# Patient Record
Sex: Male | Born: 1970
Health system: Southern US, Community
[De-identification: ages and names within clinical notes are randomized; demographics above are authoritative.]

## PROBLEM LIST (undated history)

## (undated) ENCOUNTER — Emergency Department (HOSPITAL_COMMUNITY): Payer: Medicaid Other

## (undated) DIAGNOSIS — I4891 Unspecified atrial fibrillation: Secondary | ICD-10-CM

## (undated) DIAGNOSIS — N289 Disorder of kidney and ureter, unspecified: Secondary | ICD-10-CM

## (undated) DIAGNOSIS — E119 Type 2 diabetes mellitus without complications: Secondary | ICD-10-CM

## (undated) HISTORY — PX: BELOW KNEE LEG AMPUTATION: SUR23

---

## 2020-05-12 ENCOUNTER — Encounter (HOSPITAL_COMMUNITY): Payer: Self-pay | Admitting: Emergency Medicine

## 2020-05-12 ENCOUNTER — Emergency Department (HOSPITAL_COMMUNITY)
Admission: EM | Admit: 2020-05-12 | Discharge: 2020-05-13 | Disposition: A | Discharge status: Home / Self Care | Payer: Medicaid Other | Attending: Emergency Medicine | Admitting: Emergency Medicine

## 2020-05-12 ENCOUNTER — Other Ambulatory Visit: Payer: Self-pay

## 2020-05-12 DIAGNOSIS — Z992 Dependence on renal dialysis: Secondary | ICD-10-CM | POA: Diagnosis not present

## 2020-05-12 DIAGNOSIS — L0231 Cutaneous abscess of buttock: Secondary | ICD-10-CM | POA: Diagnosis not present

## 2020-05-12 DIAGNOSIS — I10 Essential (primary) hypertension: Secondary | ICD-10-CM

## 2020-05-12 DIAGNOSIS — N186 End stage renal disease: Secondary | ICD-10-CM | POA: Insufficient documentation

## 2020-05-12 DIAGNOSIS — E1122 Type 2 diabetes mellitus with diabetic chronic kidney disease: Secondary | ICD-10-CM | POA: Insufficient documentation

## 2020-05-12 DIAGNOSIS — I120 Hypertensive chronic kidney disease with stage 5 chronic kidney disease or end stage renal disease: Secondary | ICD-10-CM | POA: Diagnosis not present

## 2020-05-12 DIAGNOSIS — R222 Localized swelling, mass and lump, trunk: Secondary | ICD-10-CM | POA: Diagnosis present

## 2020-05-12 HISTORY — DX: Disorder of kidney and ureter, unspecified: N28.9

## 2020-05-12 HISTORY — DX: Type 2 diabetes mellitus without complications: E11.9

## 2020-05-12 HISTORY — DX: Unspecified atrial fibrillation: I48.91

## 2020-05-12 LAB — CBC WITH DIFFERENTIAL/PLATELET
Abs Immature Granulocytes: 0.09 10*3/uL — ABNORMAL HIGH (ref 0.00–0.07)
Basophils Absolute: 0.1 10*3/uL (ref 0.0–0.1)
Basophils Relative: 0 %
Eosinophils Absolute: 0.1 10*3/uL (ref 0.0–0.5)
Eosinophils Relative: 1 %
HCT: 30.1 % — ABNORMAL LOW (ref 39.0–52.0)
Hemoglobin: 9.5 g/dL — ABNORMAL LOW (ref 13.0–17.0)
Immature Granulocytes: 1 %
Lymphocytes Relative: 3 %
Lymphs Abs: 0.5 10*3/uL — ABNORMAL LOW (ref 0.7–4.0)
MCH: 29.2 pg (ref 26.0–34.0)
MCHC: 31.6 g/dL (ref 30.0–36.0)
MCV: 92.6 fL (ref 80.0–100.0)
Monocytes Absolute: 1 10*3/uL (ref 0.1–1.0)
Monocytes Relative: 7 %
Neutro Abs: 12.5 10*3/uL — ABNORMAL HIGH (ref 1.7–7.7)
Neutrophils Relative %: 88 %
Platelets: 203 10*3/uL (ref 150–400)
RBC: 3.25 MIL/uL — ABNORMAL LOW (ref 4.22–5.81)
RDW: 16.9 % — ABNORMAL HIGH (ref 11.5–15.5)
WBC: 14.2 10*3/uL — ABNORMAL HIGH (ref 4.0–10.5)
nRBC: 0 % (ref 0.0–0.2)

## 2020-05-12 LAB — CBG MONITORING, ED
Glucose-Capillary: 513 mg/dL (ref 70–99)
Glucose-Capillary: 460 mg/dL — ABNORMAL HIGH (ref 70–99)
Glucose-Capillary: 277 mg/dL — ABNORMAL HIGH (ref 70–99)

## 2020-05-12 LAB — COMPREHENSIVE METABOLIC PANEL
ALT: 19 U/L (ref 0–44)
AST: 21 U/L (ref 15–41)
Albumin: 2.5 g/dL — ABNORMAL LOW (ref 3.5–5.0)
Alkaline Phosphatase: 178 U/L — ABNORMAL HIGH (ref 38–126)
Anion gap: 14 (ref 5–15)
BUN: 71 mg/dL — ABNORMAL HIGH (ref 6–20)
CO2: 22 mmol/L (ref 22–32)
Calcium: 7.9 mg/dL — ABNORMAL LOW (ref 8.9–10.3)
Chloride: 90 mmol/L — ABNORMAL LOW (ref 98–111)
Creatinine, Ser: 12.51 mg/dL — ABNORMAL HIGH (ref 0.61–1.24)
GFR calc Af Amer: 5 mL/min — ABNORMAL LOW (ref >60)
GFR calc non Af Amer: 4 mL/min — ABNORMAL LOW (ref >60)
Glucose, Bld: 576 mg/dL (ref 70–99)
Potassium: 5.2 mmol/L — ABNORMAL HIGH (ref 3.5–5.1)
Sodium: 126 mmol/L — ABNORMAL LOW (ref 135–145)
Total Bilirubin: 0.8 mg/dL (ref 0.3–1.2)
Total Protein: 6.7 g/dL (ref 6.5–8.1)

## 2020-05-12 LAB — RENAL FUNCTION PANEL
Albumin: 2.6 g/dL — ABNORMAL LOW (ref 3.5–5.0)
Anion gap: 19 — ABNORMAL HIGH (ref 5–15)
BUN: 78 mg/dL — ABNORMAL HIGH (ref 6–20)
CO2: 23 mmol/L (ref 22–32)
Calcium: 8.6 mg/dL — ABNORMAL LOW (ref 8.9–10.3)
Chloride: 92 mmol/L — ABNORMAL LOW (ref 98–111)
Creatinine, Ser: 13.24 mg/dL — ABNORMAL HIGH (ref 0.61–1.24)
GFR calc Af Amer: 5 mL/min — ABNORMAL LOW (ref >60)
GFR calc non Af Amer: 4 mL/min — ABNORMAL LOW (ref >60)
Glucose, Bld: 174 mg/dL — ABNORMAL HIGH (ref 70–99)
Phosphorus: 7.1 mg/dL — ABNORMAL HIGH (ref 2.5–4.6)
Potassium: 4.9 mmol/L (ref 3.5–5.1)
Sodium: 134 mmol/L — ABNORMAL LOW (ref 135–145)

## 2020-05-12 MED ORDER — SODIUM CHLORIDE 0.9 % IV SOLN: 100.0000 mL | INTRAVENOUS | Status: DC | PRN

## 2020-05-12 MED ORDER — PANTOPRAZOLE SODIUM 40 MG PO TBEC
40.00 | DELAYED_RELEASE_TABLET | ORAL | Status: DC
Start: 2020-05-11 — End: 2020-05-12

## 2020-05-12 MED ORDER — ATORVASTATIN CALCIUM 40 MG PO TABS
40.00 | ORAL_TABLET | ORAL | Status: DC
Start: 2020-05-10 — End: 2020-05-12

## 2020-05-12 MED ORDER — HYDRALAZINE HCL 25 MG PO TABS
100.0000 mg | ORAL_TABLET | Freq: Three times a day (TID) | ORAL | Status: DC
  Administered 2020-05-12 (×2): 100 mg via ORAL
  Filled 2020-05-12 (×2): qty 4

## 2020-05-12 MED ORDER — SODIUM ZIRCONIUM CYCLOSILICATE 10 G PO PACK: 20.0000 g | PACK | Freq: Once | ORAL | Status: DC

## 2020-05-12 MED ORDER — HEPARIN SODIUM (PORCINE) 1000 UNIT/ML IJ SOLN
INTRAMUSCULAR | Status: AC
  Administered 2020-05-12: 1000 [IU] via INTRAVENOUS_CENTRAL
  Filled 2020-05-12: qty 4

## 2020-05-12 MED ORDER — HYDRALAZINE HCL 50 MG PO TABS
100.00 | ORAL_TABLET | ORAL | Status: DC
Start: 2020-05-10 — End: 2020-05-12

## 2020-05-12 MED ORDER — INSULIN LISPRO 100 UNIT/ML ~~LOC~~ SOLN: 2.0000 [IU] | Freq: Three times a day (TID) | SUBCUTANEOUS | Status: DC

## 2020-05-12 MED ORDER — AMLODIPINE BESYLATE 5 MG PO TABS
10.00 | ORAL_TABLET | ORAL | Status: DC
Start: 2020-05-11 — End: 2020-05-12

## 2020-05-12 MED ORDER — INSULIN GLARGINE 100 UNIT/ML ~~LOC~~ SOLN
25.0000 [IU] | Freq: Every day | SUBCUTANEOUS | Status: DC
  Administered 2020-05-12: 25 [IU] via SUBCUTANEOUS
  Filled 2020-05-12 (×2): qty 0.25

## 2020-05-12 MED ORDER — ACETAMINOPHEN 325 MG PO TABS
650.00 | ORAL_TABLET | ORAL | Status: DC
Start: ? — End: 2020-05-12

## 2020-05-12 MED ORDER — PREGABALIN 25 MG PO CAPS
25.00 | ORAL_CAPSULE | ORAL | Status: DC
Start: 2020-05-11 — End: 2020-05-12

## 2020-05-12 MED ORDER — APIXABAN 2.5 MG PO TABS
2.5000 mg | ORAL_TABLET | Freq: Two times a day (BID) | ORAL | Status: DC
  Filled 2020-05-12: qty 1

## 2020-05-12 MED ORDER — HEPARIN SODIUM (PORCINE) 1000 UNIT/ML DIALYSIS
1000.0000 [IU] | INTRAMUSCULAR | Status: DC | PRN
  Filled 2020-05-12 (×2): qty 1

## 2020-05-12 MED ORDER — CLONIDINE HCL 0.1 MG PO TABS
0.10 | ORAL_TABLET | ORAL | Status: DC
Start: ? — End: 2020-05-12

## 2020-05-12 MED ORDER — CARVEDILOL 12.5 MG PO TABS
12.5000 mg | ORAL_TABLET | Freq: Two times a day (BID) | ORAL | Status: DC
  Administered 2020-05-12: 12.5 mg via ORAL
  Filled 2020-05-12: qty 1

## 2020-05-12 MED ORDER — QUETIAPINE FUMARATE 200 MG PO TABS
200.0000 mg | ORAL_TABLET | Freq: Every day | ORAL | Status: DC
  Filled 2020-05-12: qty 1

## 2020-05-12 MED ORDER — ATORVASTATIN CALCIUM 40 MG PO TABS
40.0000 mg | ORAL_TABLET | Freq: Every day | ORAL | Status: DC
  Administered 2020-05-12: 40 mg via ORAL
  Filled 2020-05-12: qty 1

## 2020-05-12 MED ORDER — AMLODIPINE BESYLATE 5 MG PO TABS
10.0000 mg | ORAL_TABLET | Freq: Every day | ORAL | Status: DC
  Administered 2020-05-12: 10 mg via ORAL
  Filled 2020-05-12: qty 2

## 2020-05-12 MED ORDER — BUMETANIDE 2 MG PO TABS
2.0000 mg | ORAL_TABLET | ORAL | Status: DC
  Administered 2020-05-12: 2 mg via ORAL
  Filled 2020-05-12 (×2): qty 1

## 2020-05-12 MED ORDER — PREGABALIN 25 MG PO CAPS: 25.0000 mg | ORAL_CAPSULE | Freq: Every day | ORAL | Status: DC

## 2020-05-12 MED ORDER — DOXYCYCLINE HYCLATE 100 MG PO CAPS
100.0000 mg | ORAL_CAPSULE | Freq: Two times a day (BID) | ORAL | 0 refills | Status: DC
Start: 2020-05-12 — End: 2020-07-07

## 2020-05-12 MED ORDER — GLUCOSE 40 % PO GEL
15.00 | ORAL | Status: DC
Start: ? — End: 2020-05-12

## 2020-05-12 MED ORDER — INSULIN ASPART 100 UNIT/ML ~~LOC~~ SOLN
2.0000 [IU] | Freq: Three times a day (TID) | SUBCUTANEOUS | Status: DC
  Administered 2020-05-12: 7 [IU] via SUBCUTANEOUS

## 2020-05-12 MED ORDER — OXYCODONE-ACETAMINOPHEN 5-325 MG PO TABS
2.0000 | ORAL_TABLET | Freq: Once | ORAL | Status: AC
  Administered 2020-05-12: 2 via ORAL
  Filled 2020-05-12: qty 2

## 2020-05-12 MED ORDER — CARVEDILOL 12.5 MG PO TABS
25.00 | ORAL_TABLET | ORAL | Status: DC
Start: 2020-05-10 — End: 2020-05-12

## 2020-05-12 MED ORDER — AMIODARONE HCL 200 MG PO TABS
200.00 | ORAL_TABLET | ORAL | Status: DC
Start: 2020-05-11 — End: 2020-05-12

## 2020-05-12 MED ORDER — ARIPIPRAZOLE 5 MG PO TABS
5.00 | ORAL_TABLET | ORAL | Status: DC
Start: 2020-05-11 — End: 2020-05-12

## 2020-05-12 MED ORDER — LIDOCAINE-PRILOCAINE 2.5-2.5 % EX CREA
1.0000 "application " | TOPICAL_CREAM | CUTANEOUS | Status: DC | PRN
  Filled 2020-05-12: qty 5

## 2020-05-12 MED ORDER — AMIODARONE HCL 200 MG PO TABS
200.0000 mg | ORAL_TABLET | Freq: Every day | ORAL | Status: DC
  Administered 2020-05-12: 200 mg via ORAL
  Filled 2020-05-12: qty 1

## 2020-05-12 MED ORDER — ALTEPLASE 2 MG IJ SOLR: 2.0000 mg | Freq: Once | INTRAMUSCULAR | Status: DC | PRN

## 2020-05-12 MED ORDER — SEVELAMER CARBONATE 800 MG PO TABS
1600.0000 mg | ORAL_TABLET | Freq: Three times a day (TID) | ORAL | Status: DC
  Administered 2020-05-12: 1600 mg via ORAL
  Filled 2020-05-12 (×4): qty 2

## 2020-05-12 MED ORDER — DEXTROSE 10 % IV SOLN
125.00 | INTRAVENOUS | Status: DC
Start: ? — End: 2020-05-12

## 2020-05-12 MED ORDER — APIXABAN 5 MG PO TABS
5.0000 mg | ORAL_TABLET | Freq: Two times a day (BID) | ORAL | Status: DC
  Administered 2020-05-12: 5 mg via ORAL
  Filled 2020-05-12 (×2): qty 1

## 2020-05-12 MED ORDER — LIDOCAINE HCL (PF) 1 % IJ SOLN: 5.0000 mL | INTRAMUSCULAR | Status: DC | PRN

## 2020-05-12 MED ORDER — CHLORHEXIDINE GLUCONATE CLOTH 2 % EX PADS: 6.0000 | MEDICATED_PAD | Freq: Every day | CUTANEOUS | Status: DC

## 2020-05-12 MED ORDER — PENTAFLUOROPROP-TETRAFLUOROETH EX AERO
1.0000 "application " | INHALATION_SPRAY | CUTANEOUS | Status: DC | PRN
  Filled 2020-05-12: qty 116

## 2020-05-12 MED ORDER — LOSARTAN POTASSIUM 50 MG PO TABS
100.00 | ORAL_TABLET | ORAL | Status: DC
Start: 2020-05-11 — End: 2020-05-12

## 2020-05-12 MED ORDER — CEPHALEXIN 250 MG PO CAPS
1000.0000 mg | ORAL_CAPSULE | Freq: Once | ORAL | Status: AC
  Administered 2020-05-12: 1000 mg via ORAL
  Filled 2020-05-12: qty 4

## 2020-05-12 MED ORDER — DOXYCYCLINE HYCLATE 100 MG PO TABS
100.0000 mg | ORAL_TABLET | Freq: Once | ORAL | Status: AC
  Administered 2020-05-12: 100 mg via ORAL
  Filled 2020-05-12: qty 1

## 2020-05-12 MED ORDER — LOPERAMIDE HCL 2 MG PO CAPS
2.00 | ORAL_CAPSULE | ORAL | Status: DC
Start: ? — End: 2020-05-12

## 2020-05-12 MED ORDER — ZOLPIDEM TARTRATE 5 MG PO TABS
10.00 | ORAL_TABLET | ORAL | Status: DC
Start: ? — End: 2020-05-12

## 2020-05-12 MED ORDER — LOSARTAN POTASSIUM 50 MG PO TABS
100.0000 mg | ORAL_TABLET | Freq: Every day | ORAL | Status: DC
  Administered 2020-05-12: 100 mg via ORAL
  Filled 2020-05-12: qty 2

## 2020-05-12 MED ORDER — SEVELAMER CARBONATE 800 MG PO TABS
1600.00 | ORAL_TABLET | ORAL | Status: DC
Start: 2020-05-10 — End: 2020-05-12

## 2020-05-12 MED ORDER — INSULIN LISPRO 100 UNIT/ML ~~LOC~~ SOLN
2.00 | SUBCUTANEOUS | Status: DC
Start: 2020-05-10 — End: 2020-05-12

## 2020-05-12 MED ORDER — HYDROXYZINE HCL 10 MG/5ML PO SYRP
10.00 | ORAL_SOLUTION | ORAL | Status: DC
Start: ? — End: 2020-05-12

## 2020-05-12 MED ORDER — PANTOPRAZOLE SODIUM 40 MG PO TBEC
40.0000 mg | DELAYED_RELEASE_TABLET | Freq: Every day | ORAL | Status: DC
  Administered 2020-05-12: 40 mg via ORAL
  Filled 2020-05-12: qty 1

## 2020-05-12 MED ORDER — TAMSULOSIN HCL 0.4 MG PO CAPS
0.40 | ORAL_CAPSULE | ORAL | Status: DC
Start: 2020-05-11 — End: 2020-05-12

## 2020-05-12 MED ORDER — INSULIN GLARGINE 100 UNIT/ML ~~LOC~~ SOLN
20.00 | SUBCUTANEOUS | Status: DC
Start: 2020-05-10 — End: 2020-05-12

## 2020-05-12 MED ORDER — LIDOCAINE-EPINEPHRINE (PF) 2 %-1:200000 IJ SOLN
20.0000 mL | Freq: Once | INTRAMUSCULAR | Status: AC
  Administered 2020-05-12: 20 mL
  Filled 2020-05-12: qty 20

## 2020-05-12 MED ORDER — TAMSULOSIN HCL 0.4 MG PO CAPS
0.4000 mg | ORAL_CAPSULE | Freq: Every day | ORAL | Status: DC
  Administered 2020-05-12: 0.4 mg via ORAL
  Filled 2020-05-12: qty 1

## 2020-05-12 MED ORDER — BUPROPION HCL ER (XL) 300 MG PO TB24
300.00 | ORAL_TABLET | ORAL | Status: DC
Start: 2020-05-11 — End: 2020-05-12

## 2020-05-12 MED ORDER — DICYCLOMINE HCL 10 MG PO CAPS
10.00 | ORAL_CAPSULE | ORAL | Status: DC
Start: 2020-05-10 — End: 2020-05-12

## 2020-05-12 MED ORDER — BUPROPION HCL ER (SR) 150 MG PO TB12
150.0000 mg | ORAL_TABLET | Freq: Every day | ORAL | Status: DC
  Administered 2020-05-12: 150 mg via ORAL
  Filled 2020-05-12: qty 1

## 2020-05-12 MED ORDER — INSULIN ASPART 100 UNIT/ML ~~LOC~~ SOLN
16.0000 [IU] | Freq: Once | SUBCUTANEOUS | Status: AC
  Administered 2020-05-12: 16 [IU] via SUBCUTANEOUS

## 2020-05-12 MED ORDER — ARIPIPRAZOLE 5 MG PO TABS
5.0000 mg | ORAL_TABLET | Freq: Every day | ORAL | Status: DC
  Administered 2020-05-12: 5 mg via ORAL
  Filled 2020-05-12: qty 1

## 2020-05-12 MED ORDER — GLUCAGON (RDNA) 1 MG IJ KIT
1.00 | PACK | INTRAMUSCULAR | Status: DC
Start: ? — End: 2020-05-12

## 2020-05-12 NOTE — ED Notes (Signed)
Pt requesting to have CBG checked, cbg 513.

## 2020-05-12 NOTE — Discharge Instructions (Addendum)
It was our pleasure to provide your ER care today - we hope that you feel better.  Make sure to go to your regular dialysis appointments.  Keep wound on buttock area very clean. Follow up with primary care doctor, or urgent care, in 2 days time for wound check and packing removal. Take antibiotic as prescribed.   Your blood pressure is high - take your medication as prescribed, limit salt intake, and follow up with primary care doctor this week.   Return to ER right away if worse, new symptoms, fevers, increased swelling or spreading redness, severe pain, trouble breathing, or other concern.

## 2020-05-12 NOTE — ED Notes (Signed)
Dinner Tray Ordered @ 1655. 

## 2020-05-12 NOTE — Consult Note (Signed)
Reason for Consult: To manage dialysis and dialysis related needs Referring Physician: Treveon Bourcier is an 49 y.o. male.  HPI: 49 year old male with ESRD, homelessness, hypertension, peripheral arterial disease status post bilateral BKA and chronic wounds who presents after missing dialysis yesterday.  He typically gets dialysis in [place].  He states that he is moving to [place].  It seems that he gets most of his care in [place].  He has not had any plans to change his dialysis to this location.  He is having pain in his buttocks related to wounds.  He was previously receiving wound care in [place].  He has had difficulty taking his medications.  He is planning to go back to his old dialysis unit on Tuesday.   Dialyzes at [facility]? In [place]    Past Medical History:  Diagnosis Date  . Atrial fibrillation ([place])   . Diabetes mellitus without complication ([place])   . Renal disorder    end stage    Past Surgical History:  Procedure Laterality Date  . BELOW KNEE LEG AMPUTATION Bilateral     FH: alcohol use disorder in father  Social History:  has no history on file for tobacco, alcohol, and drug.  Allergies: No Known Allergies  Medications: I have reviewed the patient's current medications.   Results for orders placed or performed during the hospital encounter of 05/12/20 (from the past 48 hour(s))  Comprehensive metabolic panel     Status: Abnormal   Collection Time: 05/12/20  6:14 AM  Result Value Ref Range   Sodium 126 (L) 135 - 145 mmol/L   Potassium 5.2 (H) 3.5 - 5.1 mmol/L   Chloride 90 (L) 98 - 111 mmol/L   CO2 22 22 - 32 mmol/L   Glucose, Bld 576 (HH) 70 - 99 mg/dL    Comment: Glucose reference range applies only to samples taken after fasting for at least 8 hours. CRITICAL RESULT CALLED TO, READ BACK BY AND VERIFIED WITH: K.NEWNEM RN @ [phone] 05/12/2020 BY C.EDENS    BUN 71 (H) 6 - 20 mg/dL   Creatinine, Ser 12.51 (H) 0.61 - 1.24  mg/dL   Calcium 7.9 (L) 8.9 - 10.3 mg/dL   Total Protein 6.7 6.5 - 8.1 g/dL   Albumin 2.5 (L) 3.5 - 5.0 g/dL   AST 21 15 - 41 U/L   ALT 19 0 - 44 U/L   Alkaline Phosphatase 178 (H) 38 - 126 U/L   Total Bilirubin 0.8 0.3 - 1.2 mg/dL   GFR calc non Af Amer 4 (L) >60 mL/min   GFR calc Af Amer 5 (L) >60 mL/min   Anion gap 14 5 - 15    Comment: Performed at [facility] [address] [place], [place] [address]  CBC with Differential     Status: Abnormal   Collection Time: 05/12/20  6:14 AM  Result Value Ref Range   WBC 14.2 (H) 4.0 - 10.5 K/uL   RBC 3.25 (L) 4.22 - 5.81 MIL/uL   Hemoglobin 9.5 (L) 13.0 - 17.0 g/dL   HCT 30.1 (L) 39.0 - 52.0 %   MCV 92.6 80.0 - 100.0 fL   MCH 29.2 26.0 - 34.0 pg   MCHC 31.6 30.0 - 36.0 g/dL   RDW 16.9 (H) 11.5 - 15.5 %   Platelets 203 150 - 400 K/uL   nRBC 0.0 0.0 - 0.2 %   Neutrophils Relative % 88 %   Neutro Abs 12.5 (H) 1.7 -  7.7 K/uL   Lymphocytes Relative 3 %   Lymphs Abs 0.5 (L) 0.7 - 4.0 K/uL   Monocytes Relative 7 %   Monocytes Absolute 1.0 0.1 - 1.0 K/uL   Eosinophils Relative 1 %   Eosinophils Absolute 0.1 0.0 - 0.5 K/uL   Basophils Relative 0 %   Basophils Absolute 0.1 0.0 - 0.1 K/uL   Immature Granulocytes 1 %   Abs Immature Granulocytes 0.09 (H) 0.00 - 0.07 K/uL    Comment: Performed at [facility] [address] [place], [place] [address]  CBG monitoring, ED     Status: Abnormal   Collection Time: 05/12/20  8:54 AM  Result Value Ref Range   Glucose-Capillary 513 (HH) 70 - 99 mg/dL    Comment: Glucose reference range applies only to samples taken after fasting for at least 8 hours.   Comment 1 Notify RN   CBG monitoring, ED     Status: Abnormal   Collection Time: 05/12/20 11:23 AM  Result Value Ref Range   Glucose-Capillary 460 (H) 70 - 99 mg/dL    Comment: Glucose reference range applies only to samples taken after fasting for at least 8 hours.   Comment 1 Notify RN    Comment 2 Document in Chart     No  results found.  ROS: 12 system reviewed and negative except per HPI Blood pressure (!) 209/123, pulse (!) 101, temperature 97.9 F (36.6 C), temperature source Oral, resp. rate 18, weight 117.9 kg, SpO2 100 %. GEN: Chronically ill-appearing, lying in bed, no distress ENT: no nasal discharge, mmm EYES: no scleral icterus, eomi CV: normal rate, no murmurs PULM: no iwob, bilateral chest rise ABD: NABS, non-distended SKIN: no rashes or jaundice EXT: Minimal stop edema, warm   Assessment/Plan: 1 buttock abscess: Status post drainage in the ER.  Needs to follow-up with wound care in [place].  Defer further management to ER 2 ESRD: We will perform dialysis today.  Instructed the patient that he has to go to dialysis at his home unit and if he wishes to change to a different unit they will need to get him set up.  We will use his labs to guide treatment as we do not have access to his records. 3 Hypertension: Blood pressure markedly elevated intermittently in the emergency department.  Ultrafiltration with dialysis and recommend he continue his home medications 4. Anemia of ESRD: Hemoglobin 9.5.  No ESA's given we do not know his scalp patient regimen. 5. Metabolic Bone Disease: On sevelamer 1600 mg 3 times daily at home   Reesa Chew 05/12/2020, 4:20 PM

## 2020-05-12 NOTE — ED Notes (Signed)
Patient now reports that he wears 5L O2 at baseline. Pts O2 sat 99% on room air. patient provided supplemental O2 per his request.

## 2020-05-12 NOTE — ED Triage Notes (Signed)
Pt arrives via [facility] from home for c/o "blisters" to buttocks x1 week. Reports he is unable to lie down or sit on his buttocks due to pain. Hx of dialysis, missed session yesterday. EMS VS 176/112, HR 109, O2 96% ra, temp 97.7. pt also has bilateral bkas.

## 2020-05-12 NOTE — ED Notes (Signed)
Pt transported to Hemodialysis.  

## 2020-05-12 NOTE — ED Provider Notes (Signed)
[facility] EMERGENCY DEPARTMENT Provider Note   CSN: [id number] Arrival date & time: 05/12/20  0556     History Chief Complaint  Patient presents with  . Pressure Ulcer    Lucas [name] is a 49 y.o. male.  Patient with hx ESRD on HD T/TH/Sat, c/o missed dialysis yesterday and left buttock abscess. Symptoms acute onset in past few days, constant, moderate, persistent, dull, non radiating. Denies injury or bite/sting to area of abscess. States hx same on other side. No recent antibiotic use. No recent I and D. States missed his dialysis yesterday - states used to live in [place], but due to non compliance issues could not find nephrologist in [place], so has been getting his dialysis in '[place]. Reports feeling generally weak. No increased sob (uses home o2 at baseline). No cp. No abd pain or nvd.   The history is provided by the patient.       Past Medical History:  Diagnosis Date  . Atrial fibrillation ([place])   . Diabetes mellitus without complication ([place])   . Renal disorder    end stage    There are no problems to display for this patient.   Past Surgical History:  Procedure Laterality Date  . BELOW KNEE LEG AMPUTATION Bilateral        No family history on file.  Social History   Tobacco Use  . Smoking status: Not on file  Substance Use Topics  . Alcohol use: Not on file  . Drug use: Not on file    Home Medications Prior to Admission medications   Not on File    Allergies    Patient has no known allergies.  Review of Systems   Review of Systems  Constitutional: Negative for fever.  HENT: Negative for sore throat.   Eyes: Negative for redness.  Respiratory: Negative for shortness of breath.   Cardiovascular: Negative for chest pain.  Gastrointestinal: Negative for abdominal pain.  Genitourinary: Negative for flank pain.  Musculoskeletal: Negative for back pain and neck pain.  Skin: Negative for rash.    Neurological: Negative for headaches.  Hematological: Does not bruise/bleed easily.  Psychiatric/Behavioral: Negative for confusion.    Physical Exam Updated Vital Signs BP (!) 155/92   Pulse 100   Temp 97.9 F (36.6 C) (Oral)   Resp 18   Wt 117.9 kg   SpO2 99%   Physical Exam Vitals and nursing note reviewed.  Constitutional:      Appearance: Normal appearance. He is well-developed.  HENT:     Head: Atraumatic.     Nose: Nose normal.     Mouth/Throat:     Mouth: Mucous membranes are moist.     Pharynx: Oropharynx is clear.  Eyes:     General: No scleral icterus.    Conjunctiva/sclera: Conjunctivae normal.  Neck:     Trachea: No tracheal deviation.  Cardiovascular:     Rate and Rhythm: Normal rate and regular rhythm.     Pulses: Normal pulses.     Heart sounds: Normal heart sounds. No murmur. No friction rub. No gallop.   Pulmonary:     Effort: Pulmonary effort is normal. No accessory muscle usage or respiratory distress.     Breath sounds: Normal breath sounds.  Abdominal:     General: Bowel sounds are normal. There is no distension.     Palpations: Abdomen is soft.     Tenderness: There is no abdominal tenderness. There is no guarding.  Genitourinary:  Comments: No cva tenderness. Musculoskeletal:        General: No swelling.     Cervical back: Normal range of motion and neck supple. No rigidity.     Comments: Bilateral lower extremity amputations.   Skin:    General: Skin is warm and dry.     Findings: No rash.     Comments: Left buttock abscess, 3 by 6 cm with mild surrounding induration, ?sl erythema.  At center of abscess is  ~ 1 cm diameter superficial skin ulcer/eschar.  No crepitus.   Neurological:     Mental Status: He is alert.     Comments: Alert, speech clear.   Psychiatric:        Mood and Affect: Mood normal.     ED Results / Procedures / Treatments   Labs (all labs ordered are listed, but only abnormal results are displayed) Results  for orders placed or performed during the hospital encounter of 05/12/20  Comprehensive metabolic panel  Result Value Ref Range   Sodium 126 (L) 135 - 145 mmol/L   Potassium 5.2 (H) 3.5 - 5.1 mmol/L   Chloride 90 (L) 98 - 111 mmol/L   CO2 22 22 - 32 mmol/L   Glucose, Bld 576 (HH) 70 - 99 mg/dL   BUN 71 (H) 6 - 20 mg/dL   Creatinine, Ser 12.51 (H) 0.61 - 1.24 mg/dL   Calcium 7.9 (L) 8.9 - 10.3 mg/dL   Total Protein 6.7 6.5 - 8.1 g/dL   Albumin 2.5 (L) 3.5 - 5.0 g/dL   AST 21 15 - 41 U/L   ALT 19 0 - 44 U/L   Alkaline Phosphatase 178 (H) 38 - 126 U/L   Total Bilirubin 0.8 0.3 - 1.2 mg/dL   GFR calc non Af Amer 4 (L) >60 mL/min   GFR calc Af Amer 5 (L) >60 mL/min   Anion gap 14 5 - 15  CBC with Differential  Result Value Ref Range   WBC 14.2 (H) 4.0 - 10.5 K/uL   RBC 3.25 (L) 4.22 - 5.81 MIL/uL   Hemoglobin 9.5 (L) 13.0 - 17.0 g/dL   HCT 30.1 (L) 39.0 - 52.0 %   MCV 92.6 80.0 - 100.0 fL   MCH 29.2 26.0 - 34.0 pg   MCHC 31.6 30.0 - 36.0 g/dL   RDW 16.9 (H) 11.5 - 15.5 %   Platelets 203 150 - 400 K/uL   nRBC 0.0 0.0 - 0.2 %   Neutrophils Relative % 88 %   Neutro Abs 12.5 (H) 1.7 - 7.7 K/uL   Lymphocytes Relative 3 %   Lymphs Abs 0.5 (L) 0.7 - 4.0 K/uL   Monocytes Relative 7 %   Monocytes Absolute 1.0 0.1 - 1.0 K/uL   Eosinophils Relative 1 %   Eosinophils Absolute 0.1 0.0 - 0.5 K/uL   Basophils Relative 0 %   Basophils Absolute 0.1 0.0 - 0.1 K/uL   Immature Granulocytes 1 %   Abs Immature Granulocytes 0.09 (H) 0.00 - 0.07 K/uL  CBG monitoring, ED  Result Value Ref Range   Glucose-Capillary 513 (HH) 70 - 99 mg/dL   Comment 1 Notify RN     EKG None  Radiology No results found.  Procedures .Marland KitchenIncision and Drainage  Date/Time: 05/12/2020 10:58 AM Performed by: Lajean Saver, MD Authorized by: Lajean Saver, MD   Consent:    Consent given by:  Patient Location:    Type:  Abscess   Location:  Lower extremity   Lower  extremity location:  Buttock   Buttock  location:  L buttock Pre-procedure details:    Skin preparation:  Betadine Procedure type:    Complexity:  Complex Procedure details:    Incision types:  Elliptical   Incision depth:  Subcutaneous   Scalpel blade:  11   Wound management:  Probed and deloculated, irrigated with saline and debrided (small, superficial eschar debrided)   Drainage:  Purulent   Drainage amount:  Moderate   Wound treatment:  Drain placed   Packing materials:  1/4 in gauze Post-procedure details:    Patient tolerance of procedure:  Tolerated well, no immediate complications   (including critical care time)  Medications Ordered in ED Medications  oxyCODONE-acetaminophen (PERCOCET/ROXICET) 5-325 MG per tablet 2 tablet (has no administration in time range)  lidocaine-EPINEPHrine (XYLOCAINE W/EPI) 2 %-1:200000 (PF) injection 20 mL (has no administration in time range)    ED Course  I have reviewed the triage vital signs and the nursing notes.  Pertinent labs & imaging results that were available during my care of the patient were reviewed by me and considered in my medical decision making (see chart for details).    MDM Rules/Calculators/A&P                      Labs sent.   Reviewed nursing notes and prior charts for additional history.   Labs reviewed/interpreted by me - glucose high, hco3 normal.   Nephrology consulted - discussed pt, labs incl glucose and K - they will arrange dialysis now. Also discussed with nephrology, recent move to [facility], social issues, prior HD center in [place], and that they/neph SW will likely continue to need to work to facilitate smooth dialysis even after his emergent in ED today.   I and D abscess. Sterile dressing.   Keflex/doxy po.   Bp high, hx same. No headache. No neuro symptoms. No cp or sob.  Will give dose of his bp meds.   Recheck pt - alert, content, watching tv, eating. Await dialysis. Signed out to Dr Tyrone Nine at 1520, that dialysis is pending, and had I  and D buttock abscess.      Final Clinical Impression(s) / ED Diagnoses Final diagnoses:  None    Rx / DC Orders ED Discharge Orders    None       Lajean Saver, MD 05/12/20 1521

## 2020-05-13 MED ORDER — OXYCODONE-ACETAMINOPHEN 5-325 MG PO TABS
2.0000 | ORAL_TABLET | Freq: Once | ORAL | Status: AC
  Administered 2020-05-13: 2 via ORAL
  Filled 2020-05-13: qty 2

## 2020-05-13 MED ORDER — DOXYCYCLINE HYCLATE 100 MG PO TABS
100.0000 mg | ORAL_TABLET | Freq: Once | ORAL | Status: AC
  Administered 2020-05-13: 100 mg via ORAL
  Filled 2020-05-13: qty 1

## 2020-05-13 NOTE — ED Notes (Signed)
Report given to PTAR. Pt verbalized understanding of d/c instructions, follow up care and s/s requiring return to ed. Pt had no further questions at this time and was discharged in care of PTAR

## 2020-05-13 NOTE — ED Provider Notes (Signed)
12:29 AM Assumed care from Dr. Tyrone Nine via Dr. Ashok Cordia, please see their note for full history, physical and decision making until this point. In brief this is a 49 y.o. year old male who presented to the ED tonight with Pressure Ulcer     Found to have an abscess which was drained, abx started. Also with noncompliance of dialysis and needing emergent treatment, returned to ED for recheck of same. Appears well. BP improved. No e/o resp distress to indicate need fora dmission. K ok. Stable for dc.   Discharge instructions, including strict return precautions for new or worsening symptoms, given. Patient and/or family verbalized understanding and agreement with the plan as described.   Labs, studies and imaging reviewed by myself and considered in medical decision making if ordered. Imaging interpreted by radiology.  Labs Reviewed  COMPREHENSIVE METABOLIC PANEL - Abnormal; Notable for the following components:      Result Value   Sodium 126 (*)    Potassium 5.2 (*)    Chloride 90 (*)    Glucose, Bld 576 (*)    BUN 71 (*)    Creatinine, Ser 12.51 (*)    Calcium 7.9 (*)    Albumin 2.5 (*)    Alkaline Phosphatase 178 (*)    GFR calc non Af Amer 4 (*)    GFR calc Af Amer 5 (*)    All other components within normal limits  CBC WITH DIFFERENTIAL/PLATELET - Abnormal; Notable for the following components:   WBC 14.2 (*)    RBC 3.25 (*)    Hemoglobin 9.5 (*)    HCT 30.1 (*)    RDW 16.9 (*)    Neutro Abs 12.5 (*)    Lymphs Abs 0.5 (*)    Abs Immature Granulocytes 0.09 (*)    All other components within normal limits  RENAL FUNCTION PANEL - Abnormal; Notable for the following components:   Sodium 134 (*)    Chloride 92 (*)    Glucose, Bld 174 (*)    BUN 78 (*)    Creatinine, Ser 13.24 (*)    Calcium 8.6 (*)    Phosphorus 7.1 (*)    Albumin 2.6 (*)    GFR calc non Af Amer 4 (*)    GFR calc Af Amer 5 (*)    Anion gap 19 (*)    All other components within normal limits  CBG MONITORING, ED  - Abnormal; Notable for the following components:   Glucose-Capillary 513 (*)    All other components within normal limits  CBG MONITORING, ED - Abnormal; Notable for the following components:   Glucose-Capillary 460 (*)    All other components within normal limits  CBG MONITORING, ED - Abnormal; Notable for the following components:   Glucose-Capillary 277 (*)    All other components within normal limits    No orders to display    No follow-ups on file.    Jamarious Febo, Corene Cornea, MD 05/13/20 0030

## 2020-05-22 ENCOUNTER — Other Ambulatory Visit: Payer: Self-pay

## 2020-05-22 ENCOUNTER — Emergency Department (HOSPITAL_COMMUNITY)
Admission: EM | Admit: 2020-05-22 | Discharge: 2020-05-22 | Disposition: A | Discharge status: Home / Self Care | Payer: Medicaid Other | Attending: Emergency Medicine | Admitting: Emergency Medicine

## 2020-05-22 ENCOUNTER — Emergency Department (HOSPITAL_COMMUNITY): Payer: Medicaid Other

## 2020-05-22 DIAGNOSIS — K611 Rectal abscess: Secondary | ICD-10-CM | POA: Diagnosis not present

## 2020-05-22 DIAGNOSIS — K610 Anal abscess: Secondary | ICD-10-CM | POA: Diagnosis present

## 2020-05-22 DIAGNOSIS — N186 End stage renal disease: Secondary | ICD-10-CM | POA: Insufficient documentation

## 2020-05-22 DIAGNOSIS — E119 Type 2 diabetes mellitus without complications: Secondary | ICD-10-CM | POA: Diagnosis not present

## 2020-05-22 DIAGNOSIS — Z794 Long term (current) use of insulin: Secondary | ICD-10-CM | POA: Insufficient documentation

## 2020-05-22 DIAGNOSIS — Z992 Dependence on renal dialysis: Secondary | ICD-10-CM | POA: Diagnosis not present

## 2020-05-22 DIAGNOSIS — K612 Anorectal abscess: Secondary | ICD-10-CM

## 2020-05-22 LAB — COMPREHENSIVE METABOLIC PANEL
ALT: 22 U/L (ref 0–44)
AST: 27 U/L (ref 15–41)
Albumin: 2.7 g/dL — ABNORMAL LOW (ref 3.5–5.0)
Alkaline Phosphatase: 243 U/L — ABNORMAL HIGH (ref 38–126)
Anion gap: 15 (ref 5–15)
BUN: 49 mg/dL — ABNORMAL HIGH (ref 6–20)
CO2: 23 mmol/L (ref 22–32)
Calcium: 7.7 mg/dL — ABNORMAL LOW (ref 8.9–10.3)
Chloride: 94 mmol/L — ABNORMAL LOW (ref 98–111)
Creatinine, Ser: 9.78 mg/dL — ABNORMAL HIGH (ref 0.61–1.24)
GFR calc Af Amer: 7 mL/min — ABNORMAL LOW (ref >60)
GFR calc non Af Amer: 6 mL/min — ABNORMAL LOW (ref >60)
Glucose, Bld: 494 mg/dL — ABNORMAL HIGH (ref 70–99)
Potassium: 3.9 mmol/L (ref 3.5–5.1)
Sodium: 132 mmol/L — ABNORMAL LOW (ref 135–145)
Total Bilirubin: 0.5 mg/dL (ref 0.3–1.2)
Total Protein: 7.4 g/dL (ref 6.5–8.1)

## 2020-05-22 LAB — CBC WITH DIFFERENTIAL/PLATELET
Abs Immature Granulocytes: 0.06 10*3/uL (ref 0.00–0.07)
Basophils Absolute: 0 10*3/uL (ref 0.0–0.1)
Basophils Relative: 0 %
Eosinophils Absolute: 0.1 10*3/uL (ref 0.0–0.5)
Eosinophils Relative: 1 %
HCT: 26.7 % — ABNORMAL LOW (ref 39.0–52.0)
Hemoglobin: 8.3 g/dL — ABNORMAL LOW (ref 13.0–17.0)
Immature Granulocytes: 1 %
Lymphocytes Relative: 6 %
Lymphs Abs: 0.6 10*3/uL — ABNORMAL LOW (ref 0.7–4.0)
MCH: 29.5 pg (ref 26.0–34.0)
MCHC: 31.1 g/dL (ref 30.0–36.0)
MCV: 95 fL (ref 80.0–100.0)
Monocytes Absolute: 0.5 10*3/uL (ref 0.1–1.0)
Monocytes Relative: 5 %
Neutro Abs: 9.7 10*3/uL — ABNORMAL HIGH (ref 1.7–7.7)
Neutrophils Relative %: 87 %
Platelets: 306 10*3/uL (ref 150–400)
RBC: 2.81 MIL/uL — ABNORMAL LOW (ref 4.22–5.81)
RDW: 17.2 % — ABNORMAL HIGH (ref 11.5–15.5)
WBC: 11 10*3/uL — ABNORMAL HIGH (ref 4.0–10.5)
nRBC: 0 % (ref 0.0–0.2)

## 2020-05-22 MED ORDER — DOXYCYCLINE HYCLATE 100 MG PO CAPS
100.0000 mg | ORAL_CAPSULE | Freq: Two times a day (BID) | ORAL | 0 refills | Status: DC
Start: 2020-05-22 — End: 2020-05-22

## 2020-05-22 MED ORDER — AMLODIPINE BESYLATE 10 MG PO TABS
10.0000 mg | ORAL_TABLET | Freq: Every day | ORAL | 0 refills | Status: DC
Start: 2020-05-22 — End: 2020-06-12

## 2020-05-22 MED ORDER — OXYCODONE-ACETAMINOPHEN 5-325 MG PO TABS
1.0000 | ORAL_TABLET | Freq: Once | ORAL | Status: AC
  Administered 2020-05-22: 1 via ORAL
  Filled 2020-05-22: qty 1

## 2020-05-22 MED ORDER — AMLODIPINE BESYLATE 10 MG PO TABS
10.0000 mg | ORAL_TABLET | Freq: Every day | ORAL | 0 refills | Status: DC
Start: 2020-05-22 — End: 2020-05-22

## 2020-05-22 MED ORDER — INSULIN PEN NEEDLE 29G X 12.7MM MISC: 30.0000 | Freq: Every day | 1 refills | Status: AC

## 2020-05-22 MED ORDER — ARIPIPRAZOLE 5 MG PO TABS: 5.0000 mg | ORAL_TABLET | Freq: Every day | ORAL | 0 refills | Status: DC

## 2020-05-22 MED ORDER — ONDANSETRON HCL 4 MG/2ML IJ SOLN
4.0000 mg | Freq: Once | INTRAMUSCULAR | Status: AC
  Administered 2020-05-22: 4 mg via INTRAVENOUS
  Filled 2020-05-22: qty 2

## 2020-05-22 MED ORDER — INSULIN PEN NEEDLE 29G X 12.7MM MISC: 30.0000 | Freq: Every day | 1 refills | Status: DC

## 2020-05-22 MED ORDER — CARVEDILOL 12.5 MG PO TABS: 12.5000 mg | ORAL_TABLET | Freq: Two times a day (BID) | ORAL | 0 refills | Status: AC

## 2020-05-22 MED ORDER — AMLODIPINE BESYLATE 5 MG PO TABS
10.0000 mg | ORAL_TABLET | Freq: Every day | ORAL | Status: DC
  Administered 2020-05-22: 10 mg via ORAL
  Filled 2020-05-22: qty 2

## 2020-05-22 MED ORDER — INSULIN GLARGINE 100 UNIT/ML ~~LOC~~ SOLN
20.0000 [IU] | Freq: Every day | SUBCUTANEOUS | 11 refills | Status: DC
Start: 2020-05-22 — End: 2020-05-22

## 2020-05-22 MED ORDER — DOXYCYCLINE HYCLATE 100 MG PO CAPS
100.0000 mg | ORAL_CAPSULE | Freq: Two times a day (BID) | ORAL | 0 refills | Status: AC
Start: 2020-05-22 — End: 2020-05-29

## 2020-05-22 MED ORDER — ATORVASTATIN CALCIUM 40 MG PO TABS
40.0000 mg | ORAL_TABLET | Freq: Every day | ORAL | 0 refills | Status: DC
Start: 2020-05-22 — End: 2020-05-22

## 2020-05-22 MED ORDER — ATORVASTATIN CALCIUM 40 MG PO TABS: 40.0000 mg | ORAL_TABLET | Freq: Every day | ORAL | 0 refills | Status: AC

## 2020-05-22 MED ORDER — CARVEDILOL 12.5 MG PO TABS
12.5000 mg | ORAL_TABLET | Freq: Two times a day (BID) | ORAL | Status: DC
  Administered 2020-05-22: 12.5 mg via ORAL
  Filled 2020-05-22: qty 1

## 2020-05-22 MED ORDER — CARVEDILOL 12.5 MG PO TABS
12.5000 mg | ORAL_TABLET | Freq: Two times a day (BID) | ORAL | 0 refills | Status: DC
Start: 2020-05-22 — End: 2020-05-22

## 2020-05-22 MED ORDER — INSULIN GLARGINE 100 UNIT/ML ~~LOC~~ SOLN
20.0000 [IU] | Freq: Every day | SUBCUTANEOUS | 11 refills | Status: DC
Start: 2020-05-22 — End: 2020-07-07

## 2020-05-22 MED ORDER — PANTOPRAZOLE SODIUM 40 MG IV SOLR
40.0000 mg | Freq: Once | INTRAVENOUS | Status: AC
  Administered 2020-05-22: 40 mg via INTRAVENOUS
  Filled 2020-05-22: qty 40

## 2020-05-22 NOTE — ED Notes (Signed)
No signature pad, so pt unable to sign; pt verbalized understanding of discharge instructions

## 2020-05-22 NOTE — ED Provider Notes (Signed)
[facility] DEPT Provider Note   CSN: [id number] Arrival date & time: 05/22/20  2706     History Chief Complaint  Patient presents with  . Abscess    Lucas [name] is a 49 y.o. male.  HPI   Patient presents to the emergency department with chief complaint of abscess on his gluteus.  Patient states the pain has progressively gotten worse, he was seen on 5/30 where he had his abscess I&D need and was placed on Keflex and doxy.  Patient denies picking up this medication stating that he was unable to get it.  He admits to having chills and nausea for the last week.  He also admits to one episode of vomiting that started last night.  He denies seeing any blood in his vomit.  He also admits to diarrhea which is going on for the last week as well, denies seeing any blood or tarry stools.  Patient has significant medical history of A. fib, diabetes, end-stage renal disease requiring dialysis.  His dialysis schedule is Tuesday Thursday Saturday, patient admitts to missing his Saturday session but admits that he went to his one yesterday and was supposed to have another one today.  He admits to feeling more short of breath and especially when he is laying down.  Patient denies headaches, dizziness, sore throat, chest pain, difficulties urinating, or leg pain. Past Medical History:  Diagnosis Date  . Atrial fibrillation ([place])   . Diabetes mellitus without complication ([place])   . Renal disorder    end stage    There are no problems to display for this patient.   Past Surgical History:  Procedure Laterality Date  . BELOW KNEE LEG AMPUTATION Bilateral        No family history on file.  Social History   Tobacco Use  . Smoking status: Not on file  Substance Use Topics  . Alcohol use: Not on file  . Drug use: Not on file    Home Medications Prior to Admission medications   Medication Sig Start Date End Date Taking? Authorizing Provider  amiodarone  (PACERONE) 200 MG tablet Take 200 mg by mouth daily. 04/29/20  Yes [provider]  amLODipine (NORVASC) 10 MG tablet Take 10 mg by mouth daily. 03/25/20  Yes [provider]  ARIPiprazole (ABILIFY) 5 MG tablet Take 5 mg by mouth daily. 04/16/20  Yes [provider]  atorvastatin (LIPITOR) 40 MG tablet Take 40 mg by mouth daily. 02/06/20  Yes [provider]  bumetanide (BUMEX) 2 MG tablet Take 2 mg by mouth See admin instructions. Taking 2 mg twice daily except on HD days not taking. Dialysis [facility], Sat 04/28/20  Yes [provider]  buPROPion (WELLBUTRIN SR) 150 MG 12 hr tablet Take 150 mg by mouth daily. 03/25/20  Yes [provider]  carvedilol (COREG) 12.5 MG tablet Take 12.5 mg by mouth 2 (two) times daily. 04/29/20  Yes [provider]  ELIQUIS 5 MG TABS tablet Take 5 mg by mouth 2 (two) times daily. 04/10/20  Yes [provider]  famotidine (PEPCID) 20 MG tablet Take 20 mg by mouth 2 (two) times daily. 04/02/20  Yes [provider]  HUMALOG 100 UNIT/ML injection Inject 2-12 Units into the skin 3 (three) times daily before meals. Per sliding scale:  BG 180-200= 2 units, 201-250= 5 units, 251-300= 7 units, 301-350= 10 units, 351-400 = 12 units, 400 Call MD 02/21/20  Yes [provider]  hydrALAZINE (APRESOLINE)  100 MG tablet Take 100 mg by mouth 3 (three) times daily. 04/29/20  Yes [provider]  LANTUS 100 UNIT/ML injection Inject 25 Units into the skin daily. 02/14/20  Yes [provider]  losartan (COZAAR) 100 MG tablet Take 100 mg by mouth daily. 03/25/20  Yes [provider]  metoCLOPramide (REGLAN) 5 MG tablet Take 5 mg by mouth 2 (two) times daily as needed for pain. 02/14/20  Yes [provider]  pantoprazole (PROTONIX) 40 MG tablet Take 40 mg by mouth daily. 02/06/20  Yes [provider]  pregabalin (LYRICA) 25 MG capsule Take 25 mg by mouth at bedtime. 04/22/20   Yes [provider]  QUEtiapine (SEROQUEL) 200 MG tablet Take 200 mg by mouth at bedtime. 03/25/20  Yes [provider]  sevelamer carbonate (RENVELA) 800 MG tablet Take 1,600 mg by mouth 3 (three) times daily. 02/06/20  Yes [provider]  tamsulosin (FLOMAX) 0.4 MG CAPS capsule Take 0.4 mg by mouth daily. 03/25/20  Yes [provider]  amLODipine (NORVASC) 10 MG tablet Take 1 tablet (10 mg total) by mouth daily. 05/22/20 06/21/20  Marcello Fennel, PA-C  ARIPiprazole (ABILIFY) 5 MG tablet Take 1 tablet (5 mg total) by mouth daily. 05/22/20   Marcello Fennel, PA-C  atorvastatin (LIPITOR) 40 MG tablet Take 1 tablet (40 mg total) by mouth daily. 05/22/20   Marcello Fennel, PA-C  carvedilol (COREG) 12.5 MG tablet Take 1 tablet (12.5 mg total) by mouth 2 (two) times daily with a meal. 05/22/20   Marcello Fennel, PA-C  doxycycline (VIBRAMYCIN) 100 MG capsule Take 1 capsule (100 mg total) by mouth 2 (two) times daily. 05/12/20   Lajean Saver, MD  doxycycline (VIBRAMYCIN) 100 MG capsule Take 1 capsule (100 mg total) by mouth 2 (two) times daily for 7 days. 05/22/20 05/29/20  Marcello Fennel, PA-C  insulin glargine (LANTUS) 100 UNIT/ML injection Inject 0.2 mLs (20 Units total) into the skin daily. 05/22/20   Marcello Fennel, PA-C  Insulin Pen Needle 29G X 12.7MM MISC 30 Containers by Does not apply route daily. 05/22/20   Marcello Fennel, PA-C    Allergies    Patient has no known allergies.  Review of Systems   Review of Systems  Constitutional: Positive for chills and fever. Negative for diaphoresis and fatigue.       Admits to subjective fevers.  HENT: Negative for congestion, facial swelling, sore throat and trouble swallowing.   Eyes: Negative for visual disturbance.  Respiratory: Positive for shortness of breath. Negative for cough.        Admits to shortness of breath when he lays down.  Cardiovascular: Negative for chest pain and leg swelling.        Admits to chest pain for the last week worse when he sits up.  Gastrointestinal: Positive for abdominal pain, diarrhea, nausea and vomiting.       Admits to abdominal pain for last 4 days, associated nausea and one episode of vomiting yesterday.  Genitourinary: Negative for enuresis, flank pain and frequency.  Musculoskeletal: Negative for back pain.  Skin: Negative for rash.  Neurological: Negative for dizziness, light-headedness and headaches.  Hematological: Does not bruise/bleed easily.    Physical Exam Updated Vital Signs BP (!) 163/128   Pulse 97   Temp 98.5 F (36.9 C) (Rectal)   Resp 19   Ht 6\' 4"  (1.93 m)   Wt 127 kg   SpO2 96%   BMI 34.08  kg/m   Physical Exam Vitals and nursing note reviewed.  Constitutional:      General: He is not in acute distress.    Appearance: He is not ill-appearing.  HENT:     Head: Normocephalic and atraumatic.     Nose: No congestion.     Mouth/Throat:     Mouth: Mucous membranes are moist.     Pharynx: Oropharynx is clear.  Eyes:     General: No scleral icterus. Cardiovascular:     Rate and Rhythm: Regular rhythm. Tachycardia present.     Pulses: Normal pulses.     Heart sounds: No murmur. No friction rub. No gallop.   Pulmonary:     Effort: No respiratory distress.     Breath sounds: No wheezing, rhonchi or rales.  Abdominal:     General: There is no distension.     Palpations: Abdomen is soft.     Tenderness: There is abdominal tenderness. There is no right CVA tenderness, left CVA tenderness or guarding.  Musculoskeletal:        General: No swelling.     Comments: Patient is a bilateral amputee above the knee.  Patient has 2 notable abscesses on his gluteus maximus.  1 abscess on his right gluteus measured about 3 cm in length, positive induration with notable pustulant inside the wound, no discharge or drainage noted. negative fluctuance and not erythematous or warm to the touch.  The second abscess was on the left gluteus  was about 1 cm in length, positive induration with notable pustule inside wound no discharge, drainage noted.  Negative fluctuance and the wound was not erythematous or warm to the touch.  Skin:    General: Skin is warm and dry.     Capillary Refill: Capillary refill takes less than 2 seconds.     Findings: No rash.  Neurological:     Mental Status: He is alert and oriented to person, place, and time.  Psychiatric:        Mood and Affect: Mood normal.     ED Results / Procedures / Treatments   Labs (all labs ordered are listed, but only abnormal results are displayed) Labs Reviewed  COMPREHENSIVE METABOLIC PANEL - Abnormal; Notable for the following components:      Result Value   Sodium 132 (*)    Chloride 94 (*)    Glucose, Bld 494 (*)    BUN 49 (*)    Creatinine, Ser 9.78 (*)    Calcium 7.7 (*)    Albumin 2.7 (*)    Alkaline Phosphatase 243 (*)    GFR calc non Af Amer 6 (*)    GFR calc Af Amer 7 (*)    All other components within normal limits  CBC WITH DIFFERENTIAL/PLATELET - Abnormal; Notable for the following components:   WBC 11.0 (*)    RBC 2.81 (*)    Hemoglobin 8.3 (*)    HCT 26.7 (*)    RDW 17.2 (*)    Neutro Abs 9.7 (*)    Lymphs Abs 0.6 (*)    All other components within normal limits    EKG EKG Interpretation  Date/Time:  Wednesday May 22 2020 07:36:11 EDT Ventricular Rate:  98 PR Interval:    QRS Duration: 104 QT Interval:  413 QTC Calculation: 528 R Axis:   -15 Text Interpretation: Sinus rhythm Borderline left axis deviation Nonspecific T abnormalities, lateral leads Prolonged QT interval Confirmed by Milton Ferguson [phone]) on 05/22/2020 7:42:48 AM  Radiology DG Chest 2 View  Result Date: 05/22/2020 CLINICAL DATA:  Fever, shortness of breath EXAM: CHEST - 2 VIEW COMPARISON:  05/06/2020 FINDINGS: Right internal jugular approach hemodialysis catheter terminates at the level of the right atrium. Stable mild cardiomegaly. Pulmonary vascular  congestion in mildly increased interstitial markings bilaterally. No pleural effusion. No pneumothorax. IMPRESSION: Cardiomegaly with pulmonary vascular congestion and mild interstitial edema. Electronically Signed   By: Davina Poke D.O.   On: 05/22/2020 08:22    Procedures Procedures (including critical care time)  Medications Ordered in ED Medications  amLODipine (NORVASC) tablet 10 mg (10 mg Oral Given 05/22/20 0933)  carvedilol (COREG) tablet 12.5 mg (12.5 mg Oral Given 05/22/20 0933)  ondansetron (ZOFRAN) injection 4 mg (4 mg Intravenous Given 05/22/20 0743)  oxyCODONE-acetaminophen (PERCOCET/ROXICET) 5-325 MG per tablet 1 tablet (1 tablet Oral Given 05/22/20 0743)  pantoprazole (PROTONIX) injection 40 mg (40 mg Intravenous Given 05/22/20 0933)    ED Course  I have reviewed the triage vital signs and the nursing notes.  Pertinent labs & imaging results that were available during my care of the patient were reviewed by me and considered in my medical decision making (see chart for details).  Clinical Course as of May 23 1343  Wed May 22, 2020  0850 Alkaline Phosphatase(!): 243 [WF]    Clinical Course User Index [WF] Marcello Fennel, PA-C   MDM Rules/Calculators/A&P                     I have personally reviewed all imaging, labs and have interpreted them.  Due to patient's significant medical history of diabetes and end-stage renal disease as well as chief complaint of abscess concern for sepsis versus cellulitis versus versus fluid overload.  I have given the patient Zofran for nausea, Protonix for abdominal pain as well as oxycodone for pain due to his abscesses.    I am less concerned for sepsis as the patient is afebrile, normotensive, nontachycardic and does not have leukocytosis on CBC.  I am less concerned for cellulitis as there is no overlying erythema around the skin and it is not warm to the touch.  The abscesses were just recently drained on 05/30 there is no  fluctuance noted and there are no indications for I&D at this time.  Patient will be placed on antibiotics for further treatment as well as referral to surgery for further evaluation.  Chest x-ray showed cardiomegaly with pulmonary vascular congestion and mild interstitial edema which is consistent with fluid overload secondary to end-stage renal disease with dialysis.  Patient's lung exams were unmarkable clear bilaterally, patient not having any difficulty breathing satting 100% room air.  Recommend that patient goes to dialysis tomorrow for his normal treatment.  I have consulted with case worker who is going to get his medications for him before he leaves assist him with travel arrangements to dialysis.  Patient does not  appear to be in any acute distress, is resting comfortably in bed, breathing on 100% percent room air.  Patient was given his home medication for hypertension which improved his blood pressure.  It is likely that the patient's abdominal pain as well as occasional chest pain is secondary to his end-stage renal disease and needs dialysis to remove fluids.  I have consulted with case management transportation to and from dialysis as well as getting him his medications. Patient was discussed with attending who examined and agrees with exam and plan for this patient.  Patient was  given at home care as well as strict return precautions.  Patient was explained the results and plan, patient states he understood and agrees with the plan.  Final Clinical Impression(s) / ED Diagnoses Final diagnoses:  End stage chronic kidney disease ([place])  Abscess of anal and rectal regions    Rx / DC Orders ED Discharge Orders         Ordered    amLODipine (NORVASC) 10 MG tablet  Daily     05/22/20 1332    ARIPiprazole (ABILIFY) 5 MG tablet  Daily,   Status:  Discontinued     05/22/20 1332    carvedilol (COREG) 12.5 MG tablet  2 times daily with meals     05/22/20 1332    atorvastatin (LIPITOR) 40 MG  tablet  Daily     05/22/20 1332    doxycycline (VIBRAMYCIN) 100 MG capsule  2 times daily     05/22/20 1341    insulin glargine (LANTUS) 100 UNIT/ML injection  Daily     05/22/20 1341    ARIPiprazole (ABILIFY) 5 MG tablet  Daily     05/22/20 1341    Insulin Pen Needle 29G X 12.7MM MISC  Daily     05/22/20 1341           Aron Baba 05/22/20 1344    Milton Ferguson, MD 05/27/20 1513

## 2020-05-22 NOTE — ED Notes (Signed)
[facility] called for patient. SW/CM at bedside, she picked up his medications for him.

## 2020-05-22 NOTE — Discharge Instructions (Addendum)
You have been seen for a abscess on your bottom.  I prescribed you antibiotics please take as prescribed.  I want you to keep the area clean by running water over the area daily and applying a new clean bandage on it after each wash.  I want you to go to your scheduled dialysis treatment tomorrow and I have given you a referral to a surgeon for your abscesses please follow-up for further evaluation and management.  I want you to come back to the emergency department if you develop fever, chills, shortness of breath, chest pain, increase swelling or redness around your abscess, drainage as this requires further evaluation and management.

## 2020-05-22 NOTE — Progress Notes (Signed)
TOC CM spoke to pt at bedside. States he moved to [place] to live with friends [name] and [place]. States he uses Medicaid transportation. Contacted [employer] and he can no longer use their services. Contacted [employer] Transportation and they will arrange [employer] transportation to pick him up tomorrow at 8:45 am to get to his dialysis appt. Contacted [facility] and cancelled Rx so his Medicaid can cover at out [employer]. Pt states he does not have money to pay for copay. Will ask pharmacy to waive copay price. [employer] and do deliver. Will need pt information and pharmacy they will need to have meds transferred from, spoke to [place]. Received pt's permission to fax his facesheet to [employer]. Provided contact numbers for [place] transportation on his [employer]. Appt scheduled for 6/16 at 3:50 pm, Medicaid transportation has been arranged for that day at [facility]. Information was placed on dc summary. [place], [place] ED TOC CM [phone]
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
## 2020-05-22 NOTE — ED Notes (Signed)
SW/CM at bedside.

## 2020-05-29 ENCOUNTER — Ambulatory Visit (INDEPENDENT_AMBULATORY_CARE_PROVIDER_SITE_OTHER): Payer: Medicaid Other | Admitting: Primary Care

## 2020-06-09 ENCOUNTER — Other Ambulatory Visit: Payer: Self-pay

## 2020-06-09 ENCOUNTER — Emergency Department (HOSPITAL_COMMUNITY): Payer: Medicaid Other

## 2020-06-09 ENCOUNTER — Inpatient Hospital Stay (HOSPITAL_COMMUNITY)
Admission: AD | Admit: 2020-06-09 | Discharge: 2020-07-07 | DRG: 314 | Disposition: A | Discharge status: Home / Self Care | Payer: Medicaid Other | Attending: Family Medicine | Admitting: Family Medicine

## 2020-06-09 DIAGNOSIS — T80211A Bloodstream infection due to central venous catheter, initial encounter: Secondary | ICD-10-CM | POA: Diagnosis present

## 2020-06-09 DIAGNOSIS — D631 Anemia in chronic kidney disease: Secondary | ICD-10-CM | POA: Diagnosis present

## 2020-06-09 DIAGNOSIS — M19011 Primary osteoarthritis, right shoulder: Secondary | ICD-10-CM | POA: Diagnosis present

## 2020-06-09 DIAGNOSIS — E669 Obesity, unspecified: Secondary | ICD-10-CM | POA: Diagnosis present

## 2020-06-09 DIAGNOSIS — I340 Nonrheumatic mitral (valve) insufficiency: Secondary | ICD-10-CM | POA: Diagnosis not present

## 2020-06-09 DIAGNOSIS — R7881 Bacteremia: Secondary | ICD-10-CM | POA: Diagnosis present

## 2020-06-09 DIAGNOSIS — E1121 Type 2 diabetes mellitus with diabetic nephropathy: Secondary | ICD-10-CM | POA: Diagnosis not present

## 2020-06-09 DIAGNOSIS — Z8249 Family history of ischemic heart disease and other diseases of the circulatory system: Secondary | ICD-10-CM

## 2020-06-09 DIAGNOSIS — Z66 Do not resuscitate: Secondary | ICD-10-CM | POA: Diagnosis present

## 2020-06-09 DIAGNOSIS — M898X9 Other specified disorders of bone, unspecified site: Secondary | ICD-10-CM | POA: Diagnosis present

## 2020-06-09 DIAGNOSIS — I132 Hypertensive heart and chronic kidney disease with heart failure and with stage 5 chronic kidney disease, or end stage renal disease: Secondary | ICD-10-CM | POA: Diagnosis present

## 2020-06-09 DIAGNOSIS — Z7901 Long term (current) use of anticoagulants: Secondary | ICD-10-CM

## 2020-06-09 DIAGNOSIS — R509 Fever, unspecified: Secondary | ICD-10-CM

## 2020-06-09 DIAGNOSIS — R932 Abnormal findings on diagnostic imaging of liver and biliary tract: Secondary | ICD-10-CM

## 2020-06-09 DIAGNOSIS — E1122 Type 2 diabetes mellitus with diabetic chronic kidney disease: Secondary | ICD-10-CM | POA: Diagnosis present

## 2020-06-09 DIAGNOSIS — I361 Nonrheumatic tricuspid (valve) insufficiency: Secondary | ICD-10-CM | POA: Diagnosis not present

## 2020-06-09 DIAGNOSIS — M25519 Pain in unspecified shoulder: Secondary | ICD-10-CM

## 2020-06-09 DIAGNOSIS — N186 End stage renal disease: Secondary | ICD-10-CM | POA: Diagnosis present

## 2020-06-09 DIAGNOSIS — E785 Hyperlipidemia, unspecified: Secondary | ICD-10-CM | POA: Diagnosis present

## 2020-06-09 DIAGNOSIS — T80219A Unspecified infection due to central venous catheter, initial encounter: Secondary | ICD-10-CM

## 2020-06-09 DIAGNOSIS — I509 Heart failure, unspecified: Secondary | ICD-10-CM | POA: Diagnosis present

## 2020-06-09 DIAGNOSIS — Y92009 Unspecified place in unspecified non-institutional (private) residence as the place of occurrence of the external cause: Secondary | ICD-10-CM | POA: Diagnosis not present

## 2020-06-09 DIAGNOSIS — Z79899 Other long term (current) drug therapy: Secondary | ICD-10-CM

## 2020-06-09 DIAGNOSIS — K7031 Alcoholic cirrhosis of liver with ascites: Secondary | ICD-10-CM | POA: Diagnosis not present

## 2020-06-09 DIAGNOSIS — I339 Acute and subacute endocarditis, unspecified: Secondary | ICD-10-CM | POA: Diagnosis not present

## 2020-06-09 DIAGNOSIS — F1410 Cocaine abuse, uncomplicated: Secondary | ICD-10-CM | POA: Diagnosis present

## 2020-06-09 DIAGNOSIS — N5089 Other specified disorders of the male genital organs: Secondary | ICD-10-CM

## 2020-06-09 DIAGNOSIS — Z9115 Patient's noncompliance with renal dialysis: Secondary | ICD-10-CM

## 2020-06-09 DIAGNOSIS — Z20822 Contact with and (suspected) exposure to covid-19: Secondary | ICD-10-CM | POA: Diagnosis present

## 2020-06-09 DIAGNOSIS — K2100 Gastro-esophageal reflux disease with esophagitis, without bleeding: Secondary | ICD-10-CM | POA: Diagnosis present

## 2020-06-09 DIAGNOSIS — Z794 Long term (current) use of insulin: Secondary | ICD-10-CM

## 2020-06-09 DIAGNOSIS — K7469 Other cirrhosis of liver: Secondary | ICD-10-CM | POA: Diagnosis not present

## 2020-06-09 DIAGNOSIS — Z89511 Acquired absence of right leg below knee: Secondary | ICD-10-CM

## 2020-06-09 DIAGNOSIS — Z833 Family history of diabetes mellitus: Secondary | ICD-10-CM

## 2020-06-09 DIAGNOSIS — E11649 Type 2 diabetes mellitus with hypoglycemia without coma: Secondary | ICD-10-CM | POA: Diagnosis not present

## 2020-06-09 DIAGNOSIS — R0789 Other chest pain: Secondary | ICD-10-CM | POA: Diagnosis present

## 2020-06-09 DIAGNOSIS — E1165 Type 2 diabetes mellitus with hyperglycemia: Secondary | ICD-10-CM | POA: Diagnosis present

## 2020-06-09 DIAGNOSIS — M79609 Pain in unspecified limb: Secondary | ICD-10-CM | POA: Diagnosis not present

## 2020-06-09 DIAGNOSIS — L0231 Cutaneous abscess of buttock: Secondary | ICD-10-CM | POA: Diagnosis present

## 2020-06-09 DIAGNOSIS — F1210 Cannabis abuse, uncomplicated: Secondary | ICD-10-CM | POA: Diagnosis present

## 2020-06-09 DIAGNOSIS — Z89512 Acquired absence of left leg below knee: Secondary | ICD-10-CM

## 2020-06-09 DIAGNOSIS — I4892 Unspecified atrial flutter: Secondary | ICD-10-CM | POA: Diagnosis present

## 2020-06-09 DIAGNOSIS — B952 Enterococcus as the cause of diseases classified elsewhere: Secondary | ICD-10-CM | POA: Diagnosis present

## 2020-06-09 DIAGNOSIS — I058 Other rheumatic mitral valve diseases: Secondary | ICD-10-CM | POA: Diagnosis not present

## 2020-06-09 DIAGNOSIS — M7989 Other specified soft tissue disorders: Secondary | ICD-10-CM | POA: Diagnosis not present

## 2020-06-09 DIAGNOSIS — M25511 Pain in right shoulder: Secondary | ICD-10-CM | POA: Diagnosis not present

## 2020-06-09 DIAGNOSIS — F319 Bipolar disorder, unspecified: Secondary | ICD-10-CM | POA: Diagnosis present

## 2020-06-09 DIAGNOSIS — Y848 Other medical procedures as the cause of abnormal reaction of the patient, or of later complication, without mention of misadventure at the time of the procedure: Secondary | ICD-10-CM | POA: Diagnosis present

## 2020-06-09 DIAGNOSIS — T383X6A Underdosing of insulin and oral hypoglycemic [antidiabetic] drugs, initial encounter: Secondary | ICD-10-CM | POA: Diagnosis present

## 2020-06-09 DIAGNOSIS — Z91128 Patient's intentional underdosing of medication regimen for other reason: Secondary | ICD-10-CM

## 2020-06-09 DIAGNOSIS — R748 Abnormal levels of other serum enzymes: Secondary | ICD-10-CM | POA: Diagnosis not present

## 2020-06-09 DIAGNOSIS — I4891 Unspecified atrial fibrillation: Secondary | ICD-10-CM | POA: Diagnosis present

## 2020-06-09 DIAGNOSIS — K7460 Unspecified cirrhosis of liver: Secondary | ICD-10-CM | POA: Diagnosis present

## 2020-06-09 DIAGNOSIS — E1140 Type 2 diabetes mellitus with diabetic neuropathy, unspecified: Secondary | ICD-10-CM | POA: Diagnosis present

## 2020-06-09 DIAGNOSIS — B999 Unspecified infectious disease: Secondary | ICD-10-CM | POA: Diagnosis not present

## 2020-06-09 DIAGNOSIS — Z9114 Patient's other noncompliance with medication regimen: Secondary | ICD-10-CM

## 2020-06-09 DIAGNOSIS — Z992 Dependence on renal dialysis: Secondary | ICD-10-CM

## 2020-06-09 DIAGNOSIS — I059 Rheumatic mitral valve disease, unspecified: Secondary | ICD-10-CM | POA: Diagnosis present

## 2020-06-09 DIAGNOSIS — I330 Acute and subacute infective endocarditis: Secondary | ICD-10-CM | POA: Diagnosis present

## 2020-06-09 DIAGNOSIS — N185 Chronic kidney disease, stage 5: Secondary | ICD-10-CM | POA: Diagnosis not present

## 2020-06-09 DIAGNOSIS — E1129 Type 2 diabetes mellitus with other diabetic kidney complication: Secondary | ICD-10-CM | POA: Diagnosis present

## 2020-06-09 DIAGNOSIS — G8929 Other chronic pain: Secondary | ICD-10-CM | POA: Diagnosis not present

## 2020-06-09 DIAGNOSIS — R188 Other ascites: Secondary | ICD-10-CM | POA: Diagnosis present

## 2020-06-09 DIAGNOSIS — E118 Type 2 diabetes mellitus with unspecified complications: Secondary | ICD-10-CM | POA: Diagnosis not present

## 2020-06-09 LAB — PHOSPHORUS: Phosphorus: 6.3 mg/dL — ABNORMAL HIGH (ref 2.5–4.6)

## 2020-06-09 LAB — BETA-HYDROXYBUTYRIC ACID: Beta-Hydroxybutyric Acid: 0.05 mmol/L (ref 0.05–0.27)

## 2020-06-09 LAB — BASIC METABOLIC PANEL
Anion gap: 16 — ABNORMAL HIGH (ref 5–15)
Anion gap: 15 (ref 5–15)
BUN: 53 mg/dL — ABNORMAL HIGH (ref 6–20)
BUN: 54 mg/dL — ABNORMAL HIGH (ref 6–20)
CO2: 16 mmol/L — ABNORMAL LOW (ref 22–32)
CO2: 19 mmol/L — ABNORMAL LOW (ref 22–32)
Calcium: 8.2 mg/dL — ABNORMAL LOW (ref 8.9–10.3)
Calcium: 8.5 mg/dL — ABNORMAL LOW (ref 8.9–10.3)
Chloride: 95 mmol/L — ABNORMAL LOW (ref 98–111)
Chloride: 98 mmol/L (ref 98–111)
Creatinine, Ser: 10.1 mg/dL — ABNORMAL HIGH (ref 0.61–1.24)
Creatinine, Ser: 10.36 mg/dL — ABNORMAL HIGH (ref 0.61–1.24)
GFR calc Af Amer: 6 mL/min — ABNORMAL LOW (ref >60)
GFR calc Af Amer: 6 mL/min — ABNORMAL LOW (ref >60)
GFR calc non Af Amer: 5 mL/min — ABNORMAL LOW (ref >60)
GFR calc non Af Amer: 5 mL/min — ABNORMAL LOW (ref >60)
Glucose, Bld: 695 mg/dL (ref 70–99)
Glucose, Bld: 277 mg/dL — ABNORMAL HIGH (ref 70–99)
Potassium: 4.3 mmol/L (ref 3.5–5.1)
Potassium: 4.1 mmol/L (ref 3.5–5.1)
Sodium: 127 mmol/L — ABNORMAL LOW (ref 135–145)
Sodium: 132 mmol/L — ABNORMAL LOW (ref 135–145)

## 2020-06-09 LAB — CBC WITH DIFFERENTIAL/PLATELET
Abs Immature Granulocytes: 0.08 10*3/uL — ABNORMAL HIGH (ref 0.00–0.07)
Basophils Absolute: 0.1 10*3/uL (ref 0.0–0.1)
Basophils Relative: 0 %
Eosinophils Absolute: 0 10*3/uL (ref 0.0–0.5)
Eosinophils Relative: 0 %
HCT: 29.7 % — ABNORMAL LOW (ref 39.0–52.0)
Hemoglobin: 8.7 g/dL — ABNORMAL LOW (ref 13.0–17.0)
Immature Granulocytes: 1 %
Lymphocytes Relative: 5 %
Lymphs Abs: 0.5 10*3/uL — ABNORMAL LOW (ref 0.7–4.0)
MCH: 28.2 pg (ref 26.0–34.0)
MCHC: 29.3 g/dL — ABNORMAL LOW (ref 30.0–36.0)
MCV: 96.4 fL (ref 80.0–100.0)
Monocytes Absolute: 0.6 10*3/uL (ref 0.1–1.0)
Monocytes Relative: 5 %
Neutro Abs: 10.2 10*3/uL — ABNORMAL HIGH (ref 1.7–7.7)
Neutrophils Relative %: 89 %
Platelets: 285 10*3/uL (ref 150–400)
RBC: 3.08 MIL/uL — ABNORMAL LOW (ref 4.22–5.81)
RDW: 17.3 % — ABNORMAL HIGH (ref 11.5–15.5)
WBC: 11.4 10*3/uL — ABNORMAL HIGH (ref 4.0–10.5)
nRBC: 0 % (ref 0.0–0.2)

## 2020-06-09 LAB — OSMOLALITY: Osmolality: 324 mOsm/kg (ref 275–295)

## 2020-06-09 LAB — I-STAT VENOUS BLOOD GAS, ED
Acid-base deficit: 6 mmol/L — ABNORMAL HIGH (ref 0.0–2.0)
Bicarbonate: 20.2 mmol/L (ref 20.0–28.0)
Calcium, Ion: 1.12 mmol/L — ABNORMAL LOW (ref 1.15–1.40)
HCT: 31 % — ABNORMAL LOW (ref 39.0–52.0)
Hemoglobin: 10.5 g/dL — ABNORMAL LOW (ref 13.0–17.0)
O2 Saturation: 84 %
Potassium: 4.3 mmol/L (ref 3.5–5.1)
Sodium: 130 mmol/L — ABNORMAL LOW (ref 135–145)
TCO2: 21 mmol/L — ABNORMAL LOW (ref 22–32)
pCO2, Ven: 39.4 mmHg — ABNORMAL LOW (ref 44.0–60.0)
pH, Ven: 7.318 (ref 7.250–7.430)
pO2, Ven: 53 mmHg — ABNORMAL HIGH (ref 32.0–45.0)

## 2020-06-09 LAB — CBG MONITORING, ED
Glucose-Capillary: 475 mg/dL — ABNORMAL HIGH (ref 70–99)
Glucose-Capillary: 548 mg/dL (ref 70–99)
Glucose-Capillary: 434 mg/dL — ABNORMAL HIGH (ref 70–99)
Glucose-Capillary: 509 mg/dL (ref 70–99)
Glucose-Capillary: 306 mg/dL — ABNORMAL HIGH (ref 70–99)
Glucose-Capillary: 318 mg/dL — ABNORMAL HIGH (ref 70–99)

## 2020-06-09 LAB — TROPONIN I (HIGH SENSITIVITY)
Troponin I (High Sensitivity): 65 ng/L — ABNORMAL HIGH (ref <18)
Troponin I (High Sensitivity): 60 ng/L — ABNORMAL HIGH (ref <18)

## 2020-06-09 LAB — PROTIME-INR
INR: 1.5 — ABNORMAL HIGH (ref 0.8–1.2)
Prothrombin Time: 17.6 seconds — ABNORMAL HIGH (ref 11.4–15.2)

## 2020-06-09 LAB — SARS CORONAVIRUS 2 BY RT PCR (HOSPITAL ORDER, PERFORMED IN ~~LOC~~ HOSPITAL LAB): SARS Coronavirus 2: NEGATIVE

## 2020-06-09 MED ORDER — DEXTROSE-NACL 5-0.45 % IV SOLN: INTRAVENOUS | Status: DC

## 2020-06-09 MED ORDER — HYDROMORPHONE HCL 1 MG/ML IJ SOLN
1.0000 mg | Freq: Once | INTRAMUSCULAR | Status: AC
  Administered 2020-06-09: 1 mg via INTRAVENOUS
  Filled 2020-06-09: qty 1

## 2020-06-09 MED ORDER — INSULIN REGULAR(HUMAN) IN NACL 100-0.9 UT/100ML-% IV SOLN: INTRAVENOUS | Status: DC

## 2020-06-09 MED ORDER — ARIPIPRAZOLE 5 MG PO TABS
5.0000 mg | ORAL_TABLET | Freq: Every day | ORAL | Status: DC
  Administered 2020-06-09 – 2020-07-07 (×29): 5 mg via ORAL
  Filled 2020-06-09 (×31): qty 1

## 2020-06-09 MED ORDER — INSULIN GLARGINE 100 UNIT/ML ~~LOC~~ SOLN
20.0000 [IU] | Freq: Every day | SUBCUTANEOUS | Status: DC
  Administered 2020-06-09 – 2020-06-13 (×4): 20 [IU] via SUBCUTANEOUS
  Filled 2020-06-09 (×5): qty 0.2

## 2020-06-09 MED ORDER — CHLORHEXIDINE GLUCONATE CLOTH 2 % EX PADS
6.0000 | MEDICATED_PAD | Freq: Every day | CUTANEOUS | Status: DC
  Administered 2020-06-10: 6 via TOPICAL

## 2020-06-09 MED ORDER — SEVELAMER CARBONATE 800 MG PO TABS
1600.0000 mg | ORAL_TABLET | Freq: Three times a day (TID) | ORAL | Status: DC
  Administered 2020-06-09 – 2020-07-07 (×60): 1600 mg via ORAL
  Filled 2020-06-09 (×62): qty 2

## 2020-06-09 MED ORDER — HYDRALAZINE HCL 50 MG PO TABS
100.0000 mg | ORAL_TABLET | Freq: Three times a day (TID) | ORAL | Status: DC
  Administered 2020-06-09 – 2020-07-07 (×68): 100 mg via ORAL
  Filled 2020-06-09 (×72): qty 2

## 2020-06-09 MED ORDER — INSULIN ASPART 100 UNIT/ML ~~LOC~~ SOLN
0.0000 [IU] | Freq: Three times a day (TID) | SUBCUTANEOUS | Status: DC
  Administered 2020-06-09: 11 [IU] via SUBCUTANEOUS
  Administered 2020-06-10: 5 [IU] via SUBCUTANEOUS
  Administered 2020-06-10: 3 [IU] via SUBCUTANEOUS
  Administered 2020-06-11: 5 [IU] via SUBCUTANEOUS
  Administered 2020-06-12 (×2): 8 [IU] via SUBCUTANEOUS

## 2020-06-09 MED ORDER — HYDROMORPHONE HCL 1 MG/ML IJ SOLN
1.0000 mg | Freq: Once | INTRAMUSCULAR | Status: DC
  Filled 2020-06-09: qty 1

## 2020-06-09 MED ORDER — INSULIN REGULAR(HUMAN) IN NACL 100-0.9 UT/100ML-% IV SOLN
INTRAVENOUS | Status: DC
  Administered 2020-06-09: 11.5 [IU]/h via INTRAVENOUS
  Filled 2020-06-09: qty 100

## 2020-06-09 MED ORDER — AMLODIPINE BESYLATE 10 MG PO TABS
10.0000 mg | ORAL_TABLET | Freq: Every day | ORAL | Status: DC
  Administered 2020-06-09 – 2020-06-26 (×16): 10 mg via ORAL
  Filled 2020-06-09 (×5): qty 1
  Filled 2020-06-09: qty 2
  Filled 2020-06-09 (×11): qty 1

## 2020-06-09 MED ORDER — PANTOPRAZOLE SODIUM 40 MG PO TBEC
40.0000 mg | DELAYED_RELEASE_TABLET | Freq: Every day | ORAL | Status: DC
  Administered 2020-06-09 – 2020-07-07 (×29): 40 mg via ORAL
  Filled 2020-06-09 (×30): qty 1

## 2020-06-09 MED ORDER — DEXTROSE 50 % IV SOLN: 0.0000 mL | INTRAVENOUS | Status: DC | PRN

## 2020-06-09 MED ORDER — TAMSULOSIN HCL 0.4 MG PO CAPS
0.4000 mg | ORAL_CAPSULE | Freq: Every day | ORAL | Status: DC
  Administered 2020-06-09 – 2020-07-07 (×29): 0.4 mg via ORAL
  Filled 2020-06-09 (×29): qty 1

## 2020-06-09 MED ORDER — BUPROPION HCL ER (SR) 150 MG PO TB12
150.0000 mg | ORAL_TABLET | Freq: Every day | ORAL | Status: DC
  Filled 2020-06-09: qty 1

## 2020-06-09 MED ORDER — FAMOTIDINE 20 MG PO TABS
20.0000 mg | ORAL_TABLET | Freq: Two times a day (BID) | ORAL | Status: DC
  Administered 2020-06-09 – 2020-06-11 (×4): 20 mg via ORAL
  Filled 2020-06-09 (×4): qty 1

## 2020-06-09 MED ORDER — CARVEDILOL 12.5 MG PO TABS
12.5000 mg | ORAL_TABLET | Freq: Two times a day (BID) | ORAL | Status: DC
  Administered 2020-06-09 – 2020-07-07 (×48): 12.5 mg via ORAL
  Filled 2020-06-09 (×50): qty 1

## 2020-06-09 MED ORDER — SODIUM CHLORIDE 0.9 % IV SOLN: INTRAVENOUS | Status: DC

## 2020-06-09 MED ORDER — ATORVASTATIN CALCIUM 40 MG PO TABS
40.0000 mg | ORAL_TABLET | Freq: Every day | ORAL | Status: DC
  Administered 2020-06-09 – 2020-06-30 (×21): 40 mg via ORAL
  Filled 2020-06-09 (×21): qty 1

## 2020-06-09 MED ORDER — LOSARTAN POTASSIUM 50 MG PO TABS
100.0000 mg | ORAL_TABLET | Freq: Every day | ORAL | Status: DC
  Administered 2020-06-09: 100 mg via ORAL
  Filled 2020-06-09: qty 2

## 2020-06-09 MED ORDER — APIXABAN 5 MG PO TABS
5.0000 mg | ORAL_TABLET | Freq: Two times a day (BID) | ORAL | Status: DC
  Administered 2020-06-09: 5 mg via ORAL
  Filled 2020-06-09 (×2): qty 1

## 2020-06-09 MED ORDER — AMIODARONE HCL 200 MG PO TABS
200.0000 mg | ORAL_TABLET | Freq: Every day | ORAL | Status: DC
  Administered 2020-06-09 – 2020-07-07 (×27): 200 mg via ORAL
  Filled 2020-06-09 (×29): qty 1

## 2020-06-09 MED ORDER — DIPHENHYDRAMINE HCL 50 MG/ML IJ SOLN
25.0000 mg | Freq: Once | INTRAMUSCULAR | Status: AC
  Administered 2020-06-09: 25 mg via INTRAVENOUS

## 2020-06-09 MED ORDER — ONDANSETRON HCL 4 MG/2ML IJ SOLN
4.0000 mg | Freq: Once | INTRAMUSCULAR | Status: AC
  Administered 2020-06-09: 4 mg via INTRAVENOUS
  Filled 2020-06-09: qty 2

## 2020-06-09 MED ORDER — DIPHENHYDRAMINE HCL 50 MG/ML IJ SOLN
25.0000 mg | Freq: Once | INTRAMUSCULAR | Status: DC
  Filled 2020-06-09: qty 1

## 2020-06-09 MED ORDER — PREGABALIN 25 MG PO CAPS
25.0000 mg | ORAL_CAPSULE | Freq: Every day | ORAL | Status: DC
  Administered 2020-06-09 – 2020-06-13 (×5): 25 mg via ORAL
  Filled 2020-06-09 (×5): qty 1

## 2020-06-09 NOTE — ED Notes (Signed)
Pt transported to xray 

## 2020-06-09 NOTE — ED Notes (Signed)
Tray ordered.

## 2020-06-09 NOTE — H&P (Addendum)
[facility] Admission History and Physical Service Pager: [phone]  Patient name: Lucas [name] Medical record number: [id number] Date of birth: [birth year] Age: 49 y.o. Gender: male  Primary Care Provider: System, Pcp Not In Consultants: Nephrology Code Status: DNR Preferred Emergency Contact: Tonny Branch [phone]   Chief Complaint: Right-sided chest pain around his port.  Assessment and Plan: Lucas [name] is a 49 y.o. male presenting with hyperglycemia and pain in his chest surrounding the right subclavian port which radiates into his neck. PMH is significant for atrial flutter s/p ablation, T2DM, ESRD, hyperlipidemia, anemia, CHF, GERD, polysubstance abuse, homelessness.  Hyperglycemia in the setting of T2DM Patient is hyperglycemic on admission with blood sugars above 600.  Home medication regimens include Lantus as well as mealtime coverage. He reports taking 25 units QHS and sliding scale insulin with meals.  Patient reports that this was decreased by an emergency room provider from what his previous insulin regiment was prescribed at [facility].  Previously the patient was on Lantus 40 units daily plus lispro 10-12 units AC per sliding scale.  On admission patient was worked up for DKA/HHS.  Patient's VBG showed pH of 7.38, beta hydroxybutyrate 0.05, glucose 695, K4.3, gap 16, osmolality 324.  Patient is alert and oriented to person place and time.  Given all these findings at suggest patient is not in DKA or HHS.  This appears to be a purely hypoglycemic event.  Patient was started on insulin drip in the ED.  Given tenuous fluid situation along with the fact that this appears to be pure hyperglycemia the patient was transitioned to subcu insulin and given a diet. -Admit to inpatient teaching service with Dr. McDiarmid as attending -Discontinue insulin drip -Transition to subcu Lantus 20 units nightly -Moderate sensitivity sliding scale insulin -Hemoglobin  A1c -Morning BMP -Social work consult to ensure patient has resources available for diabetes management    Right-sided chest pain around port Patient reports the reason he came to the emergency department was because he was having pain when he woke up right around his port site.  He reports that it started this morning and that he has not experienced this pain before.  He says that he has his port changed out every Tuesday.  Patient reports that he is never had any issues with infections.  Denies any fever but says that he has occasional chills.  Denies any IV drug use or using the port to inject drugs.  Patient is afebrile in the emergency department, no leukocytosis noted.  On exam there is no erythema, no edema around port site.  It is tender to palpation and patient voices a large amount of concern about the pain. -Nephrology has been consulted, they report that they will evaluate the port and determine the next step in management.  ESRD on HD, Tuesday Thursday Saturday Reports that he is usually good about attending dialysis but missed Saturday's dialysis appointment.  Most recent dialysis was Thursday 6/24. -Nephrology consulted, appreciate recommendations -Avoid nephrotoxic agents -Renally dose medications  Atrial flutter s/p ablation Patient's home medication include amiodarone and apixaban -Continue home amiodarone and apixaban -Continuous cardiac monitoring  Hypertension Home medications include amlodipine 10 mg daily, hydralazine 100 mg 3 times daily.  Pressure since arrival have ranged from 148/112-179/128. -Continue home medications as prescribed  Hyperlipidemia No recent lipid panel on file.  Patient is on atorvastatin 40 mg daily. -Continue statin  Bipolar disorder Patient on Abilify 5 mg daily -Continue Abilify  CHF Per  chart review CHF is listed in the patient's problem list but I am unable to find records of CHF diagnosis.  There are no available echo results  indicating whether he has HFpEF or HFrEF.  There are also no appointments visible with cardiology.  Patient does have crackles in lung bases on exam but given he has missed 2 dialysis appointments this is most likely the cause.  Current heart failure medications include Bumex 2 mg twice daily on all days not at dialysis, carvedilol 12.5 mg daily. -We will monitor fluid status -Reassess after dialysis appointment -If clinically worsens consider cardiac echo  GERD Home medications include Pepcid and Protonix -Continue home Pepcid and Protonix  Polysubstance abuse Per chart review patient has history of positive UDS for cocaine and THC. -Social work consult -Monitor for signs of withdrawal  Hx of Homelessness Per chart review patient has history of homelessness.  Patient reports that he has not homeless at this time and that he is moving back to [facility] soon.  Patient does acknowledge that he missed his dialysis appointment because his ride to the dialysis appointment fell through. -Social work consulted for confirmation that patient has stable living situation  Buttocks wound Patient reports that he was prescribed doxycycline for this and that he is taking it.  He reports that it was drained by the emergency room doctors twice.  On exam it does not appear infected it appears to be healing well.  Of note the prescriptions for the doxycycline were not picked up from the pharmacy. -Wound care has been consulted -Holding antibiotics at this time   FEN/GI: Carb modified Prophylaxis: Apixaban  Disposition: Admit to medical progressive unit  History of Present Illness:  Lucas [name] is a 49 y.o. male presenting with hyperglycemia and right-sided chest pain.  Patient reports that he woke up this morning and was having pain around his port site in his right sided chest.  He reports that he is never had this pain before.  Denies any fever but acknowledges occasional chills.  Was concerned about  infection because nephrologist told him to watch out for infection at the port site.  Denies any warmth or erythema around the site.  Patient acknowledges that he has had an abscess on his left buttocks that was incision and drained twice by the emergency room providers.  He was started on doxycycline which she reports taking but through chart review it appears it was not picked up from the pharmacy.  Patient was also found to be hyperglycemic on admission in the 600s.  Patient says that he has not taken his Lantus in approximately 4 days because "I just have not been keeping up with it".  Reports that he takes 25 units of Lantus daily when he takes it.  Is alert and oriented to person place and time.  Regarding his end-stage liver disease patient reports that he is a Tuesday Thursday Saturday dialysis patient but they missed dialysis on Saturday because he could not get a ride.  He reports that he only misses dialysis when he cannot find rides.  Review Of Systems: Per HPI with the following additions:   Review of Systems  Constitutional: Positive for chills. Negative for fever.  HENT: Negative for congestion and rhinorrhea.   Eyes: Negative for visual disturbance.  Respiratory: Positive for shortness of breath. Negative for cough, chest tightness and wheezing.   Cardiovascular: Negative for chest pain.  Gastrointestinal: Positive for constipation and diarrhea. Negative for abdominal pain.  Musculoskeletal:  Positive for back pain.  Neurological: Positive for headaches. Negative for weakness.     There are no problems to display for this patient.   Past Medical History: Past Medical History:  Diagnosis Date  . Atrial fibrillation ([place])   . Diabetes mellitus without complication ([place])   . Renal disorder    end stage    Past Surgical History: Past Surgical History:  Procedure Laterality Date  . BELOW KNEE LEG AMPUTATION Bilateral     Social History: Social History   Tobacco Use   . Smoking status: Not on file  Substance Use Topics  . Alcohol use: Not on file  . Drug use: Not on file   Additional social history: Reports cocaine use and THC Please also refer to relevant sections of EMR.  Family History: Paternal and maternal history of diabetes.  Both passed away from diabetic complications Paternal history of hypertension  Allergies and Medications: No Known Allergies No current facility-administered medications on file prior to encounter.   Current Outpatient Medications on File Prior to Encounter  Medication Sig Dispense Refill  . amiodarone (PACERONE) 200 MG tablet Take 200 mg by mouth daily.    Marland Kitchen amLODipine (NORVASC) 10 MG tablet Take 10 mg by mouth daily.    Marland Kitchen amLODipine (NORVASC) 10 MG tablet Take 1 tablet (10 mg total) by mouth daily. 30 tablet 0  . ARIPiprazole (ABILIFY) 5 MG tablet Take 5 mg by mouth daily.    . ARIPiprazole (ABILIFY) 5 MG tablet Take 1 tablet (5 mg total) by mouth daily. 30 tablet 0  . atorvastatin (LIPITOR) 40 MG tablet Take 40 mg by mouth daily.    Marland Kitchen atorvastatin (LIPITOR) 40 MG tablet Take 1 tablet (40 mg total) by mouth daily. 30 tablet 0  . bumetanide (BUMEX) 2 MG tablet Take 2 mg by mouth See admin instructions. Taking 2 mg twice daily except on HD days not taking. Dialysis Harrell Lark, Sat    . buPROPion (WELLBUTRIN SR) 150 MG 12 hr tablet Take 150 mg by mouth daily.    . carvedilol (COREG) 12.5 MG tablet Take 12.5 mg by mouth 2 (two) times daily.    . carvedilol (COREG) 12.5 MG tablet Take 1 tablet (12.5 mg total) by mouth 2 (two) times daily with a meal. 30 tablet 0  . doxycycline (VIBRAMYCIN) 100 MG capsule Take 1 capsule (100 mg total) by mouth 2 (two) times daily. 14 capsule 0  . ELIQUIS 5 MG TABS tablet Take 5 mg by mouth 2 (two) times daily.    . famotidine (PEPCID) 20 MG tablet Take 20 mg by mouth 2 (two) times daily.    Marland Kitchen HUMALOG 100 UNIT/ML injection Inject 2-12 Units into the skin 3 (three) times daily before meals.  Per sliding scale:  BG 180-200= 2 units, 201-250= 5 units, 251-300= 7 units, 301-350= 10 units, 351-400 = 12 units, 400 Call MD    . hydrALAZINE (APRESOLINE) 100 MG tablet Take 100 mg by mouth 3 (three) times daily.    . insulin glargine (LANTUS) 100 UNIT/ML injection Inject 0.2 mLs (20 Units total) into the skin daily. 10 mL 11  . Insulin Pen Needle 29G X 12.7MM MISC 30 Containers by Does not apply route daily. 30 each 1  . LANTUS 100 UNIT/ML injection Inject 25 Units into the skin daily.    Marland Kitchen losartan (COZAAR) 100 MG tablet Take 100 mg by mouth daily.    . metoCLOPramide (REGLAN) 5 MG tablet Take 5 mg by mouth  2 (two) times daily as needed for pain.    . pantoprazole (PROTONIX) 40 MG tablet Take 40 mg by mouth daily.    . pregabalin (LYRICA) 25 MG capsule Take 25 mg by mouth at bedtime.    Marland Kitchen QUEtiapine (SEROQUEL) 200 MG tablet Take 200 mg by mouth at bedtime.    . sevelamer carbonate (RENVELA) 800 MG tablet Take 1,600 mg by mouth 3 (three) times daily.    . tamsulosin (FLOMAX) 0.4 MG CAPS capsule Take 0.4 mg by mouth daily.      Objective: BP (!) 151/109   Pulse 93   Temp 97.9 F (36.6 C) (Oral)   Resp 13   Ht 6\' 4"  (1.93 m)   Wt 122.5 kg   SpO2 96%   BMI 32.87 kg/m  Physical Exam Vitals reviewed.  HENT:     Head: Normocephalic and atraumatic.     Nose: No congestion or rhinorrhea.     Mouth/Throat:     Mouth: Mucous membranes are moist.     Pharynx: No oropharyngeal exudate or posterior oropharyngeal erythema.  Eyes:     Extraocular Movements: Extraocular movements intact.     Pupils: Pupils are equal, round, and reactive to light.  Cardiovascular:     Rate and Rhythm: Normal rate and regular rhythm.     Heart sounds: No murmur heard.   Pulmonary:     Effort: Pulmonary effort is normal.     Comments: Crackles in lung bases bilaterally Abdominal:     General: There is distension.     Tenderness: There is no abdominal tenderness.     Comments: Obese abdomen   Musculoskeletal:     Cervical back: Normal range of motion and neck supple. No tenderness.     Right lower leg: Edema present.     Left lower leg: Edema present.     Comments: Patient is s/p bilateral BKA's.  Right BKA has nonhealed wound.  Is not erythematous and not warm to touch.  Lower extremity thighs have edema bilaterally.  Patient has tenderness to palpation around right anterior chest where Port-A-Cath is.  None erythematous, no edema noted around site  Skin:    General: Skin is warm.     Comments: Patient has healing wound on left buttocks.  He is s/p incision and drainage x2 by ED providers  Neurological:     General: No focal deficit present.     Mental Status: He is alert.     Labs and Imaging: CBC BMET  Recent Labs  Lab 06/09/20 1135 06/09/20 1135 06/09/20 1430  WBC 11.4*  --   --   HGB 8.7*   < > 10.5*  HCT 29.7*   < > 31.0*  PLT 285  --   --    < > = values in this interval not displayed.   Recent Labs  Lab 06/09/20 1135 06/09/20 1135 06/09/20 1430  NA 127*   < > 130*  K 4.3   < > 4.3  CL 95*  --   --   CO2 16*  --   --   BUN 53*  --   --   CREATININE 10.10*  --   --   GLUCOSE 695*  --   --   CALCIUM 8.2*  --   --    < > = values in this interval not displayed.     EKG: Sinus rhythm  PT/INR-17.6/1.5 Beta hydroxy-0.05 Glucose on admission 695 DG Chest 2 View  Result Date: 06/09/2020 CLINICAL DATA:  Chest pain adjacent to a right subclavian port, with pain radiating to the neck. Dialysis patient. EXAM: CHEST - 2 VIEW COMPARISON:  05/22/2020 FINDINGS: Cardiac silhouette borderline enlarged. No mediastinal or hilar masses. Mild thickening of the fissures without convincing interstitial edema. No lung opacities to suggest pneumonia. Small bilateral pleural effusions new since the prior exam. No pneumothorax. Tunneled right anterior chest wall, internal jugular dual lumen central venous catheter appears unchanged from the previous exam, distal tip in the  right atrium. Skeletal structures are grossly intact. IMPRESSION: 1. No evidence of pneumonia or pulmonary edema. 2. Small pleural effusions which are new since the prior study. No other change. 3. Stable right internal jugular tunneled dual lumen central venous catheter. Electronically Signed   By: Lajean Manes M.D.   On: 06/09/2020 12:46     Gifford Shave, MD 06/09/2020, 3:14 PM PGY-1, [place] Intern pager: [phone], text pages welcome  FPTS Upper-Level Resident Addendum   I have independently interviewed and examined the patient. I have discussed the above with the original author and agree with their documentation. My edits for correction/addition/clarification are in blue. Please see also any attending notes.    Matilde Haymaker MD PGY-2, [facility] 06/09/2020 8:27 PM  [facility] Service pager: [phone] (text pages welcome through [facility])

## 2020-06-09 NOTE — Progress Notes (Addendum)
With evaluate patient after nurse page with 10/10 pain from his right chest at the site of his catheter.  Patient states that he has significant pain at the site of the catheter but this is not unchanged from earlier.  He states earlier in the day he got some pain medication for this which seemed to help but it seems like this medication is wearing off and he would like some pain medication so that he is a bit more comfortable and can sleep.  Patient denies this pain being any different than the pain he had earlier today and states the pain is confined to the site of the catheter on his right pec.  Patient denies shortness of breath.  Physical exam General: No apparent distress Heart: Mildly tachycardic with regular appearing rhythm and no murmurs noted Respiratory: No respiratory distress, lungs clear to auscultation MSK/chest: Catheter present in upper right chest with no obvious signs of drainage, patient tender to palpation in the right pectoralis muscle as well as some tenderness near his right neck which seems to coincide with the site of the catheter.  Plan: -We will provide patient additional dose of Dilaudid as this appeared to help earlier -Informed patient to please let me know if any of his symptoms worsen or change -Per nephrology will have HD tomorrow.  Addendum: Nurse called back prior to giving the Dilaudid dosing and stated the patient was sleeping comfortably at this time.  Dilaudid order discontinued.

## 2020-06-09 NOTE — ED Provider Notes (Signed)
[facility] EMERGENCY DEPARTMENT Provider Note   CSN: [id number] Arrival date & time: 06/09/20  1059     History Chief Complaint  Patient presents with  . Hyperglycemia  . Vascular Access Problem    Lucas [name] is a 49 y.o. male.  Pt presents to the ED today with pain around his dialysis port.  Pt does have ESRD and is supposed to be on dialysis on Tues, Thurs, and Sat.  He was supposed to have an extra session on Friday.  He said the transport did not pick him up on Friday or Saturday.  Pt last went to dialysis on 6/24.  Pt has also not taken his insulin in several days.  He said he was just lazy.  Pt did not take his bp meds this morning.  Pt does have some sores on his buttocks and has been on doxycycline.  Pt denies any f/c.  Pt given 50 mcg fentanyl en route by EMS.        Past Medical History:  Diagnosis Date  . Atrial fibrillation ([place])   . Diabetes mellitus without complication ([place])   . Renal disorder    end stage    There are no problems to display for this patient.   Past Surgical History:  Procedure Laterality Date  . BELOW KNEE LEG AMPUTATION Bilateral        No family history on file.  Social History   Tobacco Use  . Smoking status: Not on file  Substance Use Topics  . Alcohol use: Not on file  . Drug use: Not on file    Home Medications Prior to Admission medications   Medication Sig Start Date End Date Taking? Authorizing Provider  amiodarone (PACERONE) 200 MG tablet Take 200 mg by mouth daily. 04/29/20   [provider]  amLODipine (NORVASC) 10 MG tablet Take 10 mg by mouth daily. 03/25/20   [provider]  amLODipine (NORVASC) 10 MG tablet Take 1 tablet (10 mg total) by mouth daily. 05/22/20 06/21/20  Milton Ferguson, MD  ARIPiprazole (ABILIFY) 5 MG tablet Take 5 mg by mouth daily. 04/16/20   [provider]  ARIPiprazole (ABILIFY) 5 MG tablet Take 1 tablet (5 mg total) by mouth daily. 05/22/20   Milton Ferguson, MD  atorvastatin (LIPITOR) 40 MG tablet Take 40 mg by mouth daily. 02/06/20   [provider]  atorvastatin (LIPITOR) 40 MG tablet Take 1 tablet (40 mg total) by mouth daily. 05/22/20   Milton Ferguson, MD  bumetanide (BUMEX) 2 MG tablet Take 2 mg by mouth See admin instructions. Taking 2 mg twice daily except on HD days not taking. Dialysis [facility], Sat 04/28/20   [provider]  buPROPion (WELLBUTRIN SR) 150 MG 12 hr tablet Take 150 mg by mouth daily. 03/25/20   [provider]  carvedilol (COREG) 12.5 MG tablet Take 12.5 mg by mouth 2 (two) times daily. 04/29/20   [provider]  carvedilol (COREG) 12.5 MG tablet Take 1 tablet (12.5 mg total) by mouth 2 (two) times daily with a meal. 05/22/20   Milton Ferguson, MD  doxycycline (VIBRAMYCIN) 100 MG capsule Take 1 capsule (100 mg total) by mouth 2 (two) times daily. 05/12/20   Lajean Saver, MD  ELIQUIS 5 MG TABS tablet Take 5 mg by mouth 2 (two) times daily. 04/10/20   [provider]  famotidine (PEPCID) 20 MG tablet Take 20 mg by mouth 2 (two) times daily. 04/02/20   [provider]  HUMALOG 100 UNIT/ML injection Inject 2-12 Units into the skin 3 (three) times daily before meals. Per sliding scale:  BG 180-200= 2 units, 201-250= 5 units, 251-300= 7 units, 301-350= 10 units, 351-400 = 12 units, 400 Call MD 02/21/20   [provider]  hydrALAZINE (APRESOLINE) 100 MG tablet Take 100 mg by mouth 3 (three) times daily. 04/29/20   [provider]  insulin glargine (LANTUS) 100 UNIT/ML injection Inject 0.2 mLs (20 Units total) into the skin daily. 05/22/20   Milton Ferguson, MD  Insulin Pen Needle 29G X 12.7MM MISC 30 Containers by Does not apply route daily. 05/22/20   Milton Ferguson, MD  LANTUS 100 UNIT/ML injection Inject 25 Units into the skin daily. 02/14/20   [provider]  losartan (COZAAR) 100 MG tablet Take 100 mg by mouth daily. 03/25/20   [provider]    metoCLOPramide (REGLAN) 5 MG tablet Take 5 mg by mouth 2 (two) times daily as needed for pain. 02/14/20   [provider]  pantoprazole (PROTONIX) 40 MG tablet Take 40 mg by mouth daily. 02/06/20   [provider]  pregabalin (LYRICA) 25 MG capsule Take 25 mg by mouth at bedtime. 04/22/20   [provider]  QUEtiapine (SEROQUEL) 200 MG tablet Take 200 mg by mouth at bedtime. 03/25/20   [provider]  sevelamer carbonate (RENVELA) 800 MG tablet Take 1,600 mg by mouth 3 (three) times daily. 02/06/20   [provider]  tamsulosin (FLOMAX) 0.4 MG CAPS capsule Take 0.4 mg by mouth daily. 03/25/20   [provider]    Allergies    Patient has no known allergies.  Review of Systems   Review of Systems  Respiratory: Positive for shortness of breath.   Cardiovascular: Positive for chest pain.  All other systems reviewed and are negative.   Physical Exam Updated Vital Signs BP (!) 151/109   Pulse 93   Temp 97.9 F (36.6 C) (Oral)   Resp 13   Ht 6\' 4"  (1.93 m)   Wt 122.5 kg   SpO2 96%   BMI 32.87 kg/m   Physical Exam Vitals and nursing note reviewed.  Constitutional:      Appearance: Normal appearance.  HENT:     Head: Normocephalic and atraumatic.     Right Ear: External ear normal.     Left Ear: External ear normal.     Nose: Nose normal.     Mouth/Throat:     Mouth: Mucous membranes are moist.     Pharynx: Oropharynx is clear.  Eyes:     Extraocular Movements: Extraocular movements intact.     Conjunctiva/sclera: Conjunctivae normal.     Pupils: Pupils are equal, round, and reactive to light.  Neck:     Vascular: JVD present.  Cardiovascular:     Rate and Rhythm: Normal rate and regular rhythm.     Pulses: Normal pulses.     Heart sounds: Normal heart sounds.  Pulmonary:     Effort: Pulmonary effort is normal.     Breath sounds: Rhonchi present.  Abdominal:     General: Abdomen is flat. Bowel sounds are normal.      Palpations: Abdomen is soft.  Musculoskeletal:     Cervical back: Normal range of motion and neck supple.     Comments: Bilateral BKA.  Sore to right stump.  Pt said it is much better than it was.  Skin:    Capillary Refill: Capillary refill takes  less than 2 seconds.     Comments: Several small healing abscesses to his left leg and buttock  Neurological:     General: No focal deficit present.     Mental Status: He is alert and oriented to person, place, and time.  Psychiatric:        Mood and Affect: Mood is anxious.        Behavior: Behavior normal.     ED Results / Procedures / Treatments   Labs (all labs ordered are listed, but only abnormal results are displayed) Labs Reviewed  BASIC METABOLIC PANEL - Abnormal; Notable for the following components:      Result Value   Sodium 127 (*)    Chloride 95 (*)    CO2 16 (*)    Glucose, Bld 695 (*)    BUN 53 (*)    Creatinine, Ser 10.10 (*)    Calcium 8.2 (*)    GFR calc non Af Amer 5 (*)    GFR calc Af Amer 6 (*)    Anion gap 16 (*)    All other components within normal limits  OSMOLALITY - Abnormal; Notable for the following components:   Osmolality 324 (*)    All other components within normal limits  CBC WITH DIFFERENTIAL/PLATELET - Abnormal; Notable for the following components:   WBC 11.4 (*)    RBC 3.08 (*)    Hemoglobin 8.7 (*)    HCT 29.7 (*)    MCHC 29.3 (*)    RDW 17.3 (*)    Neutro Abs 10.2 (*)    Lymphs Abs 0.5 (*)    Abs Immature Granulocytes 0.08 (*)    All other components within normal limits  PROTIME-INR - Abnormal; Notable for the following components:   Prothrombin Time 17.6 (*)    INR 1.5 (*)    All other components within normal limits  CBG MONITORING, ED - Abnormal; Notable for the following components:   Glucose-Capillary 475 (*)    All other components within normal limits  I-STAT VENOUS BLOOD GAS, ED - Abnormal; Notable for the following components:   pCO2, Ven 39.4 (*)    pO2, Ven 53.0 (*)     TCO2 21 (*)    Acid-base deficit 6.0 (*)    Sodium 130 (*)    Calcium, Ion 1.12 (*)    HCT 31.0 (*)    Hemoglobin 10.5 (*)    All other components within normal limits  CBG MONITORING, ED - Abnormal; Notable for the following components:   Glucose-Capillary 548 (*)    All other components within normal limits  TROPONIN I (HIGH SENSITIVITY) - Abnormal; Notable for the following components:   Troponin I (High Sensitivity) 65 (*)    All other components within normal limits  SARS CORONAVIRUS 2 BY RT PCR (HOSPITAL ORDER, [facility])  BETA-HYDROXYBUTYRIC ACID  BASIC METABOLIC PANEL  BASIC METABOLIC PANEL  BASIC METABOLIC PANEL  URINALYSIS, ROUTINE W REFLEX MICROSCOPIC  RAPID URINE DRUG SCREEN, HOSP PERFORMED  BASIC METABOLIC PANEL    EKG EKG Interpretation  Date/Time:  Sunday June 09 2020 11:11:12 EDT Ventricular Rate:  93 PR Interval:    QRS Duration: 109 QT Interval:  426 QTC Calculation: 530 R Axis:   -9 Text Interpretation: Sinus rhythm Abnormal T, consider ischemia, lateral leads Prolonged QT interval No significant change since last tracing Confirmed by Isla Pence [phone]) on 06/09/2020 11:54:47 AM   Radiology DG Chest 2 View  Result Date:  06/09/2020 CLINICAL DATA:  Chest pain adjacent to a right subclavian port, with pain radiating to the neck. Dialysis patient. EXAM: CHEST - 2 VIEW COMPARISON:  05/22/2020 FINDINGS: Cardiac silhouette borderline enlarged. No mediastinal or hilar masses. Mild thickening of the fissures without convincing interstitial edema. No lung opacities to suggest pneumonia. Small bilateral pleural effusions new since the prior exam. No pneumothorax. Tunneled right anterior chest wall, internal jugular dual lumen central venous catheter appears unchanged from the previous exam, distal tip in the right atrium. Skeletal structures are grossly intact. IMPRESSION: 1. No evidence of pneumonia or pulmonary edema. 2. Small  pleural effusions which are new since the prior study. No other change. 3. Stable right internal jugular tunneled dual lumen central venous catheter. Electronically Signed   By: Lajean Manes M.D.   On: 06/09/2020 12:46    Procedures Procedures (including critical care time)  Medications Ordered in ED Medications  insulin regular, human (MYXREDLIN) 100 units/ 100 mL infusion (11.5 Units/hr Intravenous New Bag/Given 06/09/20 1507)  0.9 %  sodium chloride infusion ( Intravenous New Bag/Given 06/09/20 1508)  dextrose 5 %-0.45 % sodium chloride infusion (0 mLs Intravenous Hold 06/09/20 1509)  dextrose 50 % solution 0-50 mL (has no administration in time range)  HYDROmorphone (DILAUDID) injection 1 mg (1 mg Intravenous Given 06/09/20 1126)  ondansetron (ZOFRAN) injection 4 mg (4 mg Intravenous Given 06/09/20 1126)  diphenhydrAMINE (BENADRYL) injection 25 mg (25 mg Intravenous Given 06/09/20 1256)  HYDROmorphone (DILAUDID) injection 1 mg (1 mg Intravenous Given 06/09/20 1457)    ED Course  I have reviewed the triage vital signs and the nursing notes.  Pertinent labs & imaging results that were available during my care of the patient were reviewed by me and considered in my medical decision making (see chart for details).    MDM Rules/Calculators/A&P                          Pt is not in DKA.  He is started on an insulin drip.  Pt's CP is atypical for CAD.  It is very pleuritic.  Troponin is slightly elevated, but is likely due to ESRD.    Pt d/w FP residents for admission.  CRITICAL CARE Performed by: Isla Pence   Total critical care time: 30 minutes  Critical care time was exclusive of separately billable procedures and treating other patients.  Critical care was necessary to treat or prevent imminent or life-threatening deterioration.  Critical care was time spent personally by me on the following activities: development of treatment plan with patient and/or surrogate as well as  nursing, discussions with consultants, evaluation of patient's response to treatment, examination of patient, obtaining history from patient or surrogate, ordering and performing treatments and interventions, ordering and review of laboratory studies, ordering and review of radiographic studies, pulse oximetry and re-evaluation of patient's condition. Final Clinical Impression(s) / ED Diagnoses Final diagnoses:  ESRD on hemodialysis ([place])  Atypical chest pain  Hyperglycemia due to diabetes mellitus ([place])  Anemia associated with stage 5 chronic renal failure [facility])    Rx / DC Orders ED Discharge Orders    None       Isla Pence, MD 06/09/20 1536

## 2020-06-09 NOTE — ED Triage Notes (Signed)
Pt brought to ED from home via EMS with c/o pain in chest surrounding right subclavian port that radiates up into neck.Pt [facility] dialysis pt, diabetic, bilateral BKA. Missed dialysis Saturday, no insulin x4 days. EMS reports pt CBG "high" 600+, BP 181/124, RR 36, HR 95. Sacral wounds present, currently taking doxy. Pt given 100 NS, 50 mcg fentanyl en route. Currently A&Ox4, EDP present at bedside.

## 2020-06-09 NOTE — Consult Note (Signed)
Reason for Consult: ESRD Referring Physician:  Dr. Caron Presume  Chief Complaint: Rt sided chest pain around port  Assessment/Plan: 1 Hyperglycemia with levels above 600. Insulin has been decreased initially treated in ED with insulin gtt and transitioned to SQ.  2 ESRD: We will perform dialysis tomorrow in the AM.  Instructed the patient that he has to go to dialysis at his home unit and if he wishes to change to a different unit they will need to get him set up.  We will use his labs to guide treatment as we do not have access to his records.  3  Pain around port - questionable infection; WBC 11.4 and no calor or pus expressed but the exquisite tenderness around catheter is concerning. I don't think it would be wrong to check blood cultures and also move the catheter to the left side. Planning on HD in AM 1st.   4. Anemia of ESRD: Hemoglobin 10.5.  No ESA's given we do not know his scalp patient regimen.  5. Metabolic Bone Disease: On sevelamer 1600 mg 3 times daily at home. Will check a phos  6. Hypertension: Blood pressure markedly elevated intermittently in the emergency department.  Ultrafiltration with dialysis and recommend he continue his home medications  7. buttock abscess: Status post drainage in the ER x2 previously and doesn't appear to be infected but is supposed to be on doxycycline.  Needs to follow-up with wound care in [place].     HPI: Lucas [name] is an 49 y.o. male DM HTN CHF GERD Polysubstance abuse HLD aflutter s/ ablation (on amiodarone and apixaban) ESRD TTS in [facility] presenting with pain starting past 24hrs in the right side of chest over port radiating to the neck and worse with palpation or movement. No associated with exertion but he states he's had intermittent fevers/ chills. Denies n/v/ abdominal pain. He also denies headaches, syncopal episodes or sick contacts. He is homeless and recently lost his housing now going to move in with friend [name] in [place]. His last dialysis treatment was on Thursday in [place]. He has recently had drainage of a buttocks abscess x2 and is supposed to be on doxycycline for that wound.   ROS Pertinent items are noted in HPI.  Chemistry and CBC: Creatinine, Ser  Date/Time Value Ref Range Status  06/09/2020 11:35 AM 10.10 (H) 0.61 - 1.24 mg/dL Final  05/22/2020 07:20 AM 9.78 (H) 0.61 - 1.24 mg/dL Final  05/12/2020 09:05 PM 13.24 (H) 0.61 - 1.24 mg/dL Final  05/12/2020 06:14 AM 12.51 (H) 0.61 - 1.24 mg/dL Final   Recent Labs  Lab 06/09/20 1135 06/09/20 1430  NA 127* 130*  K 4.3 4.3  CL 95*  --   CO2 16*  --   GLUCOSE 695*  --   BUN 53*  --   CREATININE 10.10*  --   CALCIUM 8.2*  --    Recent Labs  Lab 06/09/20 1135 06/09/20 1430  WBC 11.4*  --   NEUTROABS 10.2*  --   HGB 8.7* 10.5*  HCT 29.7* 31.0*  MCV 96.4  --   PLT 285  --    Liver Function Tests: No results for input(s): AST, ALT, ALKPHOS, BILITOT, PROT, ALBUMIN in the last 168 hours. No results for input(s): LIPASE, AMYLASE in the last 168 hours. No results for input(s): AMMONIA in the last 168 hours. Cardiac Enzymes: No results for input(s): CKTOTAL, CKMB, CKMBINDEX, TROPONINI in the last 168 hours. Iron Studies: No results for  input(s): IRON, TIBC, TRANSFERRIN, FERRITIN in the last 72 hours. PT/INR: @LABRCNTIP (inr:5)  Xrays/Other Studies: ) Results for orders placed or performed during the hospital encounter of 06/09/20 (from the past 48 hour(s))  CBG monitoring, ED     Status: Abnormal   Collection Time: 06/09/20 11:24 AM  Result Value Ref Range   Glucose-Capillary 475 (H) 70 - 99 mg/dL    Comment: Glucose reference range applies only to samples taken after fasting for at least 8 hours.   Comment 1 Notify RN    Comment 2 Document in Chart   Basic metabolic panel     Status: Abnormal   Collection Time: 06/09/20 11:35 AM  Result Value Ref Range   Sodium 127 (L) 135 - 145 mmol/L   Potassium 4.3 3.5 - 5.1 mmol/L   Chloride  95 (L) 98 - 111 mmol/L   CO2 16 (L) 22 - 32 mmol/L   Glucose, Bld 695 (HH) 70 - 99 mg/dL    Comment: Glucose reference range applies only to samples taken after fasting for at least 8 hours. CRITICAL RESULT CALLED TO, READ BACK BY AND VERIFIED WITH: RN A OAKLEY AT 1216 06/09/20 BY L BENFIELD    BUN 53 (H) 6 - 20 mg/dL   Creatinine, Ser 10.10 (H) 0.61 - 1.24 mg/dL   Calcium 8.2 (L) 8.9 - 10.3 mg/dL   GFR calc non Af Amer 5 (L) >60 mL/min   GFR calc Af Amer 6 (L) >60 mL/min   Anion gap 16 (H) 5 - 15    Comment: Performed at [facility] [address] [place], [place] [address]  Osmolality     Status: Abnormal   Collection Time: 06/09/20 11:35 AM  Result Value Ref Range   Osmolality 324 (HH) 275 - 295 mOsm/kg    Comment: CRITICAL RESULT CALLED TO, READ BACK BY AND VERIFIED WITH: J BLUE,RN 06/09/2020 [place] Performed at [facility], [place] [address] [place], [place] [address]   CBC with Differential (PNL)     Status: Abnormal   Collection Time: 06/09/20 11:35 AM  Result Value Ref Range   WBC 11.4 (H) 4.0 - 10.5 K/uL   RBC 3.08 (L) 4.22 - 5.81 MIL/uL   Hemoglobin 8.7 (L) 13.0 - 17.0 g/dL   HCT 29.7 (L) 39 - 52 %   MCV 96.4 80.0 - 100.0 fL   MCH 28.2 26.0 - 34.0 pg   MCHC 29.3 (L) 30.0 - 36.0 g/dL   RDW 17.3 (H) 11.5 - 15.5 %   Platelets 285 150 - 400 K/uL   nRBC 0.0 0.0 - 0.2 %   Neutrophils Relative % 89 %   Neutro Abs 10.2 (H) 1.7 - 7.7 K/uL   Lymphocytes Relative 5 %   Lymphs Abs 0.5 (L) 0.7 - 4.0 K/uL   Monocytes Relative 5 %   Monocytes Absolute 0.6 0 - 1 K/uL   Eosinophils Relative 0 %   Eosinophils Absolute 0.0 0 - 0 K/uL   Basophils Relative 0 %   Basophils Absolute 0.1 0 - 0 K/uL   Immature Granulocytes 1 %   Abs Immature Granulocytes 0.08 (H) 0.00 - 0.07 K/uL    Comment: Performed at [facility], [place] [address], [place], [place] [address]  Protime-INR     Status: Abnormal   Collection Time: 06/09/20 11:35 AM  Result Value Ref Range    Prothrombin Time 17.6 (H) 11.4 - 15.2 seconds   INR 1.5 (H) 0.8 -  1.2    Comment: (NOTE) INR goal varies based on device and disease states. Performed at [facility], [place] [address], [place], [place] [address]   Beta-hydroxybutyric acid     Status: None   Collection Time: 06/09/20 11:35 AM  Result Value Ref Range   Beta-Hydroxybutyric Acid 0.05 0.05 - 0.27 mmol/L    Comment: Performed at [facility] [address], [place], [place] [address]  Troponin I (High Sensitivity)     Status: Abnormal   Collection Time: 06/09/20  2:20 PM  Result Value Ref Range   Troponin I (High Sensitivity) 65 (H) <18 ng/L    Comment: (NOTE) Elevated high sensitivity troponin I (hsTnI) values and significant  changes across serial measurements may suggest ACS but many other  chronic and acute conditions are known to elevate hsTnI results.  Refer to the "Links" section for chest pain algorithms and additional  guidance. Performed at [facility], [place] [address], [place], [place] [address]   I-Stat venous blood gas, [facility] ED)     Status: Abnormal   Collection Time: 06/09/20  2:30 PM  Result Value Ref Range   pH, Ven 7.318 7.25 - 7.43   pCO2, Ven 39.4 (L) 44 - 60 mmHg   pO2, Ven 53.0 (H) 32 - 45 mmHg   Bicarbonate 20.2 20.0 - 28.0 mmol/L   TCO2 21 (L) 22 - 32 mmol/L   O2 Saturation 84.0 %   Acid-base deficit 6.0 (H) 0.0 - 2.0 mmol/L   Sodium 130 (L) 135 - 145 mmol/L   Potassium 4.3 3.5 - 5.1 mmol/L   Calcium, Ion 1.12 (L) 1.15 - 1.40 mmol/L   HCT 31.0 (L) 39 - 52 %   Hemoglobin 10.5 (L) 13.0 - 17.0 g/dL   Sample type VENOUS   CBG monitoring, ED     Status: Abnormal   Collection Time: 06/09/20  2:53 PM  Result Value Ref Range   Glucose-Capillary 548 (HH) 70 - 99 mg/dL    Comment: Glucose reference range applies only to samples taken after fasting for at least 8 hours.   Comment 1 Notify RN    Comment 2 Document in Chart   SARS Coronavirus 2 by RT PCR (hospital order, performed  in [facility] hospital lab) Nasopharyngeal Nasopharyngeal Swab     Status: None   Collection Time: 06/09/20  3:11 PM   Specimen: Nasopharyngeal Swab  Result Value Ref Range   SARS Coronavirus 2 NEGATIVE NEGATIVE    Comment: (NOTE) SARS-CoV-2 target nucleic acids are NOT DETECTED.  The SARS-CoV-2 RNA is generally detectable in upper and lower respiratory specimens during the acute phase of infection. The lowest concentration of SARS-CoV-2 viral copies this assay can detect is 250 copies / mL. A negative result does not preclude SARS-CoV-2 infection and should not be used as the sole basis for treatment or other patient management decisions.  A negative result may occur with improper specimen collection / handling, submission of specimen other than nasopharyngeal swab, presence of viral mutation(s) within the areas targeted by this assay, and inadequate number of viral copies (<250 copies / mL). A negative result must be combined with clinical observations, patient history, and epidemiological information.  Fact Sheet for Patients:   StrictlyIdeas.no  Fact Sheet for Healthcare Providers: BankingDealers.co.za  This test is not yet approved or  cleared by the [place] FDA and has been authorized for detection and/or diagnosis of SARS-CoV-2 by FDA under an Emergency Use Authorization (  EUA).  This EUA will remain in effect (meaning this test can be used) for the duration of the COVID-19 declaration under Section 564(b)(1) of the Act, 21 U.S.C. section 360bbb-3(b)(1), unless the authorization is terminated or revoked sooner.  Performed at [facility], [place] [address] [place], [place] [address]   CBG monitoring, ED     Status: Abnormal   Collection Time: 06/09/20  3:35 PM  Result Value Ref Range   Glucose-Capillary 509 (HH) 70 - 99 mg/dL    Comment: Glucose reference range applies only to samples taken after fasting for at  least 8 hours.   Comment 1 Document in Chart   CBG monitoring, ED     Status: Abnormal   Collection Time: 06/09/20  4:05 PM  Result Value Ref Range   Glucose-Capillary 434 (H) 70 - 99 mg/dL    Comment: Glucose reference range applies only to samples taken after fasting for at least 8 hours.  CBG monitoring, ED     Status: Abnormal   Collection Time: 06/09/20  5:35 PM  Result Value Ref Range   Glucose-Capillary 306 (H) 70 - 99 mg/dL    Comment: Glucose reference range applies only to samples taken after fasting for at least 8 hours.   Comment 1 Notify RN    Comment 2 Document in Chart    DG Chest 2 View  Result Date: 06/09/2020 CLINICAL DATA:  Chest pain adjacent to a right subclavian port, with pain radiating to the neck. Dialysis patient. EXAM: CHEST - 2 VIEW COMPARISON:  05/22/2020 FINDINGS: Cardiac silhouette borderline enlarged. No mediastinal or hilar masses. Mild thickening of the fissures without convincing interstitial edema. No lung opacities to suggest pneumonia. Small bilateral pleural effusions new since the prior exam. No pneumothorax. Tunneled right anterior chest wall, internal jugular dual lumen central venous catheter appears unchanged from the previous exam, distal tip in the right atrium. Skeletal structures are grossly intact. IMPRESSION: 1. No evidence of pneumonia or pulmonary edema. 2. Small pleural effusions which are new since the prior study. No other change. 3. Stable right internal jugular tunneled dual lumen central venous catheter. Electronically Signed   By: Lajean Manes M.D.   On: 06/09/2020 12:46    PMH:   Past Medical History:  Diagnosis Date  . Atrial fibrillation ([place])   . Diabetes mellitus without complication ([place])   . Renal disorder    end stage    PSH:   Past Surgical History:  Procedure Laterality Date  . BELOW KNEE LEG AMPUTATION Bilateral     Allergies: No Known Allergies  Medications:   Prior to Admission medications   Medication Sig  Start Date End Date Taking? Authorizing Provider  amiodarone (PACERONE) 200 MG tablet Take 200 mg by mouth daily. 04/29/20   [provider]  amLODipine (NORVASC) 10 MG tablet Take 10 mg by mouth daily. 03/25/20   [provider]  amLODipine (NORVASC) 10 MG tablet Take 1 tablet (10 mg total) by mouth daily. 05/22/20 06/21/20  Milton Ferguson, MD  ARIPiprazole (ABILIFY) 5 MG tablet Take 5 mg by mouth daily. 04/16/20   [provider]  ARIPiprazole (ABILIFY) 5 MG tablet Take 1 tablet (5 mg total) by mouth daily. 05/22/20   Milton Ferguson, MD  atorvastatin (LIPITOR) 40 MG tablet Take 40 mg by mouth daily. 02/06/20   [provider]  atorvastatin (LIPITOR) 40 MG tablet Take 1 tablet (40 mg total) by mouth daily. 05/22/20   Milton Ferguson, MD  bumetanide (BUMEX) 2 MG tablet Take 2 mg by mouth See admin instructions. Taking 2 mg twice daily except on HD days not taking. Dialysis [facility], Sat 04/28/20   [provider]  buPROPion (WELLBUTRIN SR) 150 MG 12 hr tablet Take 150 mg by mouth daily. 03/25/20   [provider]  carvedilol (COREG) 12.5 MG tablet Take 12.5 mg by mouth 2 (two) times daily. 04/29/20   [provider]  carvedilol (COREG) 12.5 MG tablet Take 1 tablet (12.5 mg total) by mouth 2 (two) times daily with a meal. 05/22/20   Milton Ferguson, MD  doxycycline (VIBRAMYCIN) 100 MG capsule Take 1 capsule (100 mg total) by mouth 2 (two) times daily. 05/12/20   Lajean Saver, MD  ELIQUIS 5 MG TABS tablet Take 5 mg by mouth 2 (two) times daily. 04/10/20   [provider]  famotidine (PEPCID) 20 MG tablet Take 20 mg by mouth 2 (two) times daily. 04/02/20   [provider]  HUMALOG 100 UNIT/ML injection Inject 2-12 Units into the skin 3 (three) times daily before meals. Per sliding scale:  BG 180-200= 2 units, 201-250= 5 units, 251-300= 7 units, 301-350= 10 units, 351-400 = 12 units, 400 Call MD 02/21/20   [provider]  hydrALAZINE  (APRESOLINE) 100 MG tablet Take 100 mg by mouth 3 (three) times daily. 04/29/20   [provider]  insulin glargine (LANTUS) 100 UNIT/ML injection Inject 0.2 mLs (20 Units total) into the skin daily. 05/22/20   Milton Ferguson, MD  Insulin Pen Needle 29G X 12.7MM MISC 30 Containers by Does not apply route daily. 05/22/20   Milton Ferguson, MD  LANTUS 100 UNIT/ML injection Inject 25 Units into the skin daily. 02/14/20   [provider]  losartan (COZAAR) 100 MG tablet Take 100 mg by mouth daily. 03/25/20   [provider]  metoCLOPramide (REGLAN) 5 MG tablet Take 5 mg by mouth 2 (two) times daily as needed for pain. 02/14/20   [provider]  pantoprazole (PROTONIX) 40 MG tablet Take 40 mg by mouth daily. 02/06/20   [provider]  pregabalin (LYRICA) 25 MG capsule Take 25 mg by mouth at bedtime. 04/22/20   [provider]  QUEtiapine (SEROQUEL) 200 MG tablet Take 200 mg by mouth at bedtime. 03/25/20   [provider]  sevelamer carbonate (RENVELA) 800 MG tablet Take 1,600 mg by mouth 3 (three) times daily. 02/06/20   [provider]  tamsulosin (FLOMAX) 0.4 MG CAPS capsule Take 0.4 mg by mouth daily. 03/25/20   [provider]    Discontinued Meds:   Medications Discontinued During This Encounter  Medication Reason  . diphenhydrAMINE (BENADRYL) injection 25 mg   . dextrose 50 % solution 0-50 mL   . insulin regular, human (MYXREDLIN) 100 units/ 100 mL infusion   . 0.9 %  sodium chloride infusion   . dextrose 5 %-0.45 % sodium chloride infusion   . insulin regular, human (MYXREDLIN) 100 units/ 100 mL infusion   . 0.9 %  sodium chloride infusion   . dextrose 5 %-0.45 % sodium chloride infusion   . dextrose 50 % solution 0-50 mL   . buPROPion (WELLBUTRIN SR) 12 hr tablet 150 mg     Social History:  has no history on file for tobacco use, alcohol use, and drug use.  Family History:  No family history on file.  Blood pressure  (!) 151/108, pulse 96, temperature 97.9 F (36.6 C), temperature source Oral, resp.  rate 17, height 6\' 4"  (1.93 m), weight 122.5 kg, SpO2 94 %. General appearance: alert, cooperative and appears stated age Head: Normocephalic, without obvious abnormality, atraumatic Eyes: negative Neck: no adenopathy, no carotid bruit, supple, symmetrical, trachea midline and thyroid not enlarged, symmetric, no tenderness/mass/nodules Back: symmetric, no curvature. ROM normal. No CVA tenderness. Resp: rales at the bases b/l, mild tachypnea but able to speak in full sentences Chest wall: right sided chest wall tenderness GI: soft, non-tender; bowel sounds normal; no masses,  no organomegaly Extremities: edema +edema, rt BKA wound still closing by secondary intention Pulses: 2+ and symmetric Skin: Skin color, texture, turgor normal. No rashes or lesions Access: RIJ TC, exquisitely tender along track but unable to express pus       Yassir Enis, Hunt Oris, MD 06/09/2020, 7:01 PM

## 2020-06-09 NOTE — ED Notes (Signed)
SERUM OSMO    [id number]

## 2020-06-10 ENCOUNTER — Inpatient Hospital Stay (HOSPITAL_COMMUNITY): Payer: Medicaid Other

## 2020-06-10 DIAGNOSIS — Z992 Dependence on renal dialysis: Secondary | ICD-10-CM

## 2020-06-10 DIAGNOSIS — D631 Anemia in chronic kidney disease: Secondary | ICD-10-CM | POA: Diagnosis present

## 2020-06-10 DIAGNOSIS — B952 Enterococcus as the cause of diseases classified elsewhere: Secondary | ICD-10-CM

## 2020-06-10 DIAGNOSIS — R7881 Bacteremia: Secondary | ICD-10-CM | POA: Diagnosis present

## 2020-06-10 LAB — BASIC METABOLIC PANEL
Anion gap: 15 (ref 5–15)
BUN: 56 mg/dL — ABNORMAL HIGH (ref 6–20)
CO2: 18 mmol/L — ABNORMAL LOW (ref 22–32)
Calcium: 8.3 mg/dL — ABNORMAL LOW (ref 8.9–10.3)
Chloride: 98 mmol/L (ref 98–111)
Creatinine, Ser: 10.81 mg/dL — ABNORMAL HIGH (ref 0.61–1.24)
GFR calc Af Amer: 6 mL/min — ABNORMAL LOW (ref >60)
GFR calc non Af Amer: 5 mL/min — ABNORMAL LOW (ref >60)
Glucose, Bld: 227 mg/dL — ABNORMAL HIGH (ref 70–99)
Potassium: 4.1 mmol/L (ref 3.5–5.1)
Sodium: 131 mmol/L — ABNORMAL LOW (ref 135–145)

## 2020-06-10 LAB — RENAL FUNCTION PANEL
Albumin: 2.3 g/dL — ABNORMAL LOW (ref 3.5–5.0)
Anion gap: 14 (ref 5–15)
BUN: 57 mg/dL — ABNORMAL HIGH (ref 6–20)
CO2: 19 mmol/L — ABNORMAL LOW (ref 22–32)
Calcium: 8.2 mg/dL — ABNORMAL LOW (ref 8.9–10.3)
Chloride: 98 mmol/L (ref 98–111)
Creatinine, Ser: 11.15 mg/dL — ABNORMAL HIGH (ref 0.61–1.24)
GFR calc Af Amer: 6 mL/min — ABNORMAL LOW (ref >60)
GFR calc non Af Amer: 5 mL/min — ABNORMAL LOW (ref >60)
Glucose, Bld: 210 mg/dL — ABNORMAL HIGH (ref 70–99)
Phosphorus: 5.7 mg/dL — ABNORMAL HIGH (ref 2.5–4.6)
Potassium: 4 mmol/L (ref 3.5–5.1)
Sodium: 131 mmol/L — ABNORMAL LOW (ref 135–145)

## 2020-06-10 LAB — BLOOD CULTURE ID PANEL (REFLEXED)

## 2020-06-10 LAB — CBC
HCT: 24.3 % — ABNORMAL LOW (ref 39.0–52.0)
Hemoglobin: 7.5 g/dL — ABNORMAL LOW (ref 13.0–17.0)
MCH: 28.5 pg (ref 26.0–34.0)
MCHC: 30.9 g/dL (ref 30.0–36.0)
MCV: 92.4 fL (ref 80.0–100.0)
Platelets: 292 10*3/uL (ref 150–400)
RBC: 2.63 MIL/uL — ABNORMAL LOW (ref 4.22–5.81)
RDW: 17.2 % — ABNORMAL HIGH (ref 11.5–15.5)
WBC: 12.4 10*3/uL — ABNORMAL HIGH (ref 4.0–10.5)
nRBC: 0 % (ref 0.0–0.2)

## 2020-06-10 LAB — HIV ANTIBODY (ROUTINE TESTING W REFLEX): HIV Screen 4th Generation wRfx: NONREACTIVE

## 2020-06-10 LAB — GLUCOSE, CAPILLARY
Glucose-Capillary: 178 mg/dL — ABNORMAL HIGH (ref 70–99)
Glucose-Capillary: 174 mg/dL — ABNORMAL HIGH (ref 70–99)
Glucose-Capillary: 220 mg/dL — ABNORMAL HIGH (ref 70–99)
Glucose-Capillary: 215 mg/dL — ABNORMAL HIGH (ref 70–99)

## 2020-06-10 LAB — MRSA PCR SCREENING: MRSA by PCR: POSITIVE — AB

## 2020-06-10 MED ORDER — VANCOMYCIN HCL 10 G IV SOLR
2250.0000 mg | Freq: Once | INTRAVENOUS | Status: AC
  Administered 2020-06-10: 2250 mg via INTRAVENOUS
  Filled 2020-06-10: qty 2250

## 2020-06-10 MED ORDER — SODIUM CHLORIDE 0.9 % IV SOLN: 100.0000 mL | INTRAVENOUS | Status: DC | PRN

## 2020-06-10 MED ORDER — HEPARIN SODIUM (PORCINE) 1000 UNIT/ML IJ SOLN
INTRAMUSCULAR | Status: AC
  Filled 2020-06-10: qty 4

## 2020-06-10 MED ORDER — DARBEPOETIN ALFA 150 MCG/0.3ML IJ SOSY: 150.0000 ug | PREFILLED_SYRINGE | INTRAMUSCULAR | Status: DC

## 2020-06-10 MED ORDER — ALTEPLASE 2 MG IJ SOLR: 2.0000 mg | Freq: Once | INTRAMUSCULAR | Status: DC | PRN

## 2020-06-10 MED ORDER — LIDOCAINE HCL (PF) 1 % IJ SOLN: 5.0000 mL | INTRAMUSCULAR | Status: DC | PRN

## 2020-06-10 MED ORDER — ACETAMINOPHEN 325 MG PO TABS
650.0000 mg | ORAL_TABLET | Freq: Four times a day (QID) | ORAL | Status: DC | PRN
  Administered 2020-06-10 – 2020-06-18 (×7): 650 mg via ORAL
  Filled 2020-06-10 (×9): qty 2

## 2020-06-10 MED ORDER — DARBEPOETIN ALFA 150 MCG/0.3ML IJ SOSY
150.0000 ug | PREFILLED_SYRINGE | INTRAMUSCULAR | Status: DC
  Filled 2020-06-10: qty 0.3

## 2020-06-10 MED ORDER — HYDROMORPHONE HCL 2 MG PO TABS
1.0000 mg | ORAL_TABLET | Freq: Once | ORAL | Status: AC
  Administered 2020-06-10: 1 mg via ORAL
  Filled 2020-06-10: qty 1

## 2020-06-10 MED ORDER — LIDOCAINE-PRILOCAINE 2.5-2.5 % EX CREA: 1.0000 "application " | TOPICAL_CREAM | CUTANEOUS | Status: DC | PRN

## 2020-06-10 MED ORDER — MUPIROCIN 2 % EX OINT
1.0000 "application " | TOPICAL_OINTMENT | Freq: Two times a day (BID) | CUTANEOUS | Status: AC
  Administered 2020-06-10 – 2020-06-14 (×9): 1 via NASAL
  Filled 2020-06-10 (×5): qty 22

## 2020-06-10 MED ORDER — HEPARIN SODIUM (PORCINE) 1000 UNIT/ML DIALYSIS: 1000.0000 [IU] | INTRAMUSCULAR | Status: DC | PRN

## 2020-06-10 MED ORDER — CHLORHEXIDINE GLUCONATE CLOTH 2 % EX PADS
6.0000 | MEDICATED_PAD | Freq: Every day | CUTANEOUS | Status: DC
  Administered 2020-06-13: 6 via TOPICAL

## 2020-06-10 MED ORDER — PENTAFLUOROPROP-TETRAFLUOROETH EX AERO: 1.0000 "application " | INHALATION_SPRAY | CUTANEOUS | Status: DC | PRN

## 2020-06-10 NOTE — Plan of Care (Signed)
  Problem: Clinical Measurements: Goal: Will remain free from infection Outcome: Progressing   Problem: Pain Managment: Goal: General experience of comfort will improve Outcome: Progressing   

## 2020-06-10 NOTE — Consult Note (Signed)
Lucas [name] for Infectious Disease    Date of Admission:  06/09/2020     Total days of antibiotics 0               Reason for Consult: Enterococcal Bacteremia    Referring Provider: Tivis Ringer / [employer] Primary Care Provider: System, Pcp Not In   ASSESSMENT:  Lucas [name] is a 49 y/o male with pain at his dialysis cathter site found to have Enterococcal sp. bacteremia. Source of infection may be his catheter site however concern also for the wound on his right BKA as well as his recently I&D buttock abscess. Check TTE to rule out endocarditis which is of concern with his heart murmur. Will recheck blood cultures in 24 hours as he is getting his first dose of vancomycin now. Will need cathter replaced and will defer to nephrology based on his dialysis needs - possibly Wednesday or Thursday to start line holiday? Recommend imaging his right BKA site given chronic wound to rule out osteomyelitis or need for further intervention. Will start vancomycin with narrowing of antibiotics pending speciation of Enterococcus. Blood sugar control per primary team. Discussed with Lucas [name] increased risk of complicated healing and chances of infection with hyperglycemia.   PLAN:  1. Start vancomycin. 2. TTE to rule out endocarditis 3. Recommend imaging of right lower extremity to r/o osteomyelitis 4. Line holiday per nephrology pending dialysis needs.  5. Repeat blood cultures tomorrow and after line removal.  6. Monitor cultures for speciation of Enterococcus 7. Blood sugar management per primary team.    Principal Problem:   Bacteremia due to Enterococcus Active Problems:   Hyperglycemia due to diabetes mellitus (HCC)    amiodarone  200 mg Oral Daily   amLODipine  10 mg Oral Daily   ARIPiprazole  5 mg Oral Daily   atorvastatin  40 mg Oral Daily   carvedilol  12.5 mg Oral BID   Chlorhexidine Gluconate Cloth  6 each Topical Q0600   Chlorhexidine Gluconate Cloth  6 each Topical  Q0600   [START ON 06/13/2020] darbepoetin (ARANESP) injection - DIALYSIS  150 mcg Intravenous Q Thu-HD   famotidine  20 mg Oral BID   heparin sodium (porcine)       hydrALAZINE  100 mg Oral TID   insulin aspart  0-15 Units Subcutaneous TID WC   insulin glargine  20 Units Subcutaneous Daily   mupirocin ointment  1 application Nasal BID   pantoprazole  40 mg Oral Daily   pregabalin  25 mg Oral QHS   sevelamer carbonate  1,600 mg Oral TID with meals   tamsulosin  0.4 mg Oral Daily     HPI: Lucas [name] is a 49 y.o. male with previous medical history of Type 2 diabetes complicated by diabetic neuropathy s/p bilateral BKA, ESRD on hemodialysis, polysubstance abuse (cocaine and THC), atrial fibrillation s/p ablation, and homelessness admitted with pain around his dialysis port.   Lucas [name] was recently seen in the ED on 05/25/20 at [facility] for ESRD and had previous I&D of buttock abscesses at [facility] about 1 week prior and started on doxycyline.   In the ED found to have hyperglycemia with blood sugar of 695 and leukocytosis of 11.4. Less than optimal adherence to his medication regimens recently. Afebrile and stable vital signs. Chest x-ray with no evidence of pneumonia or pulmonary edema. Small new pleural effusions and stable IJ tunneled dual lumen central venous catheter. Blood cultures drawn positive for Enterococcus  sp. Bacteremia.   Review of Systems: Review of Systems  Constitutional: Negative for chills, fever and weight loss.  Respiratory: Negative for cough, shortness of breath and wheezing.   Cardiovascular: Negative for chest pain and leg swelling.       Positive for pain at right cathter site.   Gastrointestinal: Negative for abdominal pain, constipation, diarrhea, nausea and vomiting.  Skin: Negative for rash.     Past Medical History:  Diagnosis Date   Atrial fibrillation ([place])    Diabetes mellitus without complication ([place])    Renal disorder     end stage    Social History   Tobacco Use   Smoking status: Not on file  Substance Use Topics   Alcohol use: Not on file   Drug use: Not on file    No family history on file.  No Known Allergies  OBJECTIVE: Blood pressure (!) 143/91, pulse (!) 101, temperature 98.5 F (36.9 C), temperature source Oral, resp. rate 20, height 6\' 4"  (1.93 m), weight 127.7 kg, SpO2 94 %.  Physical Exam Constitutional:      General: He is not in acute distress.    Appearance: He is well-developed. He is obese. He is ill-appearing and diaphoretic.     Comments: Lying in bed with head of bed elevated; sleeping but arousable  Cardiovascular:     Rate and Rhythm: Normal rate and regular rhythm.     Heart sounds: Normal heart sounds.     Comments: Dialysis cathter present in right chest. No clear sign of infection.  Pulmonary:     Effort: Pulmonary effort is normal.     Breath sounds: Normal breath sounds.  Skin:    General: Skin is warm.  Neurological:     Mental Status: He is alert and oriented to person, place, and time.  Psychiatric:        Behavior: Behavior normal.        Thought Content: Thought content normal.        Judgment: Judgment normal.     Lab Results Lab Results  Component Value Date   WBC 12.4 (H) 06/10/2020   HGB 7.5 (L) 06/10/2020   HCT 24.3 (L) 06/10/2020   MCV 92.4 06/10/2020   PLT 292 06/10/2020    Lab Results  Component Value Date   CREATININE 11.15 (H) 06/10/2020   BUN 57 (H) 06/10/2020   NA 131 (L) 06/10/2020   K 4.0 06/10/2020   CL 98 06/10/2020   CO2 19 (L) 06/10/2020    Lab Results  Component Value Date   ALT 22 05/22/2020   AST 27 05/22/2020   ALKPHOS 243 (H) 05/22/2020   BILITOT 0.5 05/22/2020     Microbiology: Recent Results (from the past 240 hour(s))  SARS Coronavirus 2 by RT PCR (hospital order, performed in [place] hospital lab) Nasopharyngeal Nasopharyngeal Swab     Status: None   Collection Time: 06/09/20  3:11 PM   Specimen:  Nasopharyngeal Swab  Result Value Ref Range Status   SARS Coronavirus 2 NEGATIVE NEGATIVE Final    Comment: (NOTE) SARS-CoV-2 target nucleic acids are NOT DETECTED.  The SARS-CoV-2 RNA is generally detectable in upper and lower respiratory specimens during the acute phase of infection. The lowest concentration of SARS-CoV-2 viral copies this assay can detect is 250 copies / mL. A negative result does not preclude SARS-CoV-2 infection and should not be used as the sole basis for treatment or other patient management decisions.  A negative result may  occur with improper specimen collection / handling, submission of specimen other than nasopharyngeal swab, presence of viral mutation(s) within the areas targeted by this assay, and inadequate number of viral copies (<250 copies / mL). A negative result must be combined with clinical observations, patient history, and epidemiological information.  Fact Sheet for Patients:   StrictlyIdeas.no  Fact Sheet for Healthcare Providers: BankingDealers.co.za  This test is not yet approved or  cleared by the [place] FDA and has been authorized for detection and/or diagnosis of SARS-CoV-2 by FDA under an Emergency Use Authorization (EUA).  This EUA will remain in effect (meaning this test can be used) for the duration of the COVID-19 declaration under Section 564(b)(1) of the Act, 21 U.S.C. section 360bbb-3(b)(1), unless the authorization is terminated or revoked sooner.  Performed at [facility], [place] [address] [place], [place] [address]   Culture, blood (single)     Status: None (Preliminary result)   Collection Time: 06/09/20  9:22 PM   Specimen: BLOOD RIGHT HAND  Result Value Ref Range Status   Specimen Description BLOOD RIGHT HAND  Final   Special Requests   Final    BOTTLES DRAWN AEROBIC AND ANAEROBIC Blood Culture adequate volume   Culture  Setup Time   Final    GRAM POSITIVE  COCCI IN CLUSTERS AEROBIC BOTTLE ONLY IN BOTH AEROBIC AND ANAEROBIC BOTTLES CRITICAL RESULT CALLED TO, READ BACK BY AND VERIFIED WITH: [place] I [phone] FCP Performed at [facility], [place] [address] [place], [place] [address]    Culture GRAM POSITIVE COCCI  Final   Report Status PENDING  Incomplete  Blood Culture ID Panel (Reflexed)     Status: Abnormal   Collection Time: 06/09/20  9:22 PM  Result Value Ref Range Status   Enterococcus species DETECTED (A) NOT DETECTED Final    Comment: CRITICAL RESULT CALLED TO, READ BACK BY AND VERIFIED WITH: PHARMD EMILY S [phone] FCP    Vancomycin resistance NOT DETECTED NOT DETECTED Final   Listeria monocytogenes NOT DETECTED NOT DETECTED Final   Staphylococcus species NOT DETECTED NOT DETECTED Final   Staphylococcus aureus (BCID) NOT DETECTED NOT DETECTED Final   Streptococcus species NOT DETECTED NOT DETECTED Final   Streptococcus agalactiae NOT DETECTED NOT DETECTED Final   Streptococcus pneumoniae NOT DETECTED NOT DETECTED Final   Streptococcus pyogenes NOT DETECTED NOT DETECTED Final   Acinetobacter baumannii NOT DETECTED NOT DETECTED Final   Enterobacteriaceae species NOT DETECTED NOT DETECTED Final   Enterobacter cloacae complex NOT DETECTED NOT DETECTED Final   Escherichia coli NOT DETECTED NOT DETECTED Final   Klebsiella oxytoca NOT DETECTED NOT DETECTED Final   Klebsiella pneumoniae NOT DETECTED NOT DETECTED Final   Proteus species NOT DETECTED NOT DETECTED Final   Serratia marcescens NOT DETECTED NOT DETECTED Final   Haemophilus influenzae NOT DETECTED NOT DETECTED Final   Neisseria meningitidis NOT DETECTED NOT DETECTED Final   Pseudomonas aeruginosa NOT DETECTED NOT DETECTED Final   Candida albicans NOT DETECTED NOT DETECTED Final   Candida glabrata NOT DETECTED NOT DETECTED Final   Candida krusei NOT DETECTED NOT DETECTED Final   Candida parapsilosis NOT DETECTED NOT DETECTED Final   Candida tropicalis NOT  DETECTED NOT DETECTED Final    Comment: Performed at [facility], [address], [place], [place] [address]  MRSA PCR Screening     Status: Abnormal   Collection Time: 06/09/20 11:29 PM   Specimen: Nasal Mucosa; Nasopharyngeal  Result Value  Ref Range Status   MRSA by PCR POSITIVE (A) NEGATIVE Final    Comment:        The GeneXpert MRSA Assay (FDA approved for NASAL specimens only), is one component of a comprehensive MRSA colonization surveillance program. It is not intended to diagnose MRSA infection nor to guide or monitor treatment for MRSA infections. RESULT CALLED TO, READ BACK BY AND VERIFIED WITH: ISAACS,T RN 0104 06/10/2020 MITCHELL,L Performed at [facility], [place] [address] [place], [place] [address]      Terri Piedra, [place] for Infectious Disease [facility]  06/10/2020  4:04 PM

## 2020-06-10 NOTE — Progress Notes (Signed)
Family Medicine Teaching Service Daily Progress Note Intern Pager: [phone]  Patient name: Lucas [name] Medical record number: [id number] Date of birth: 1971-05-29 Age: 49 y.o. Gender: male  Primary Care Provider: System, Pcp Not In Consultants: Nephrology, infectious disease Code Status: DNR  Pt Overview and Major Events to Date:  6/27-patient admitted for hyperglycemia and port pain 6/28-blood cultures grew Enterococcus  Assessment and Plan: Lucas [name] is a 49 y.o. male presenting with hyperglycemia and pain in his chest surrounding the right subclavian port which radiates into his neck. PMH is significant for atrial flutter s/p ablation, T2DM, ESRD, hyperlipidemia, anemia, CHF, GERD, polysubstance abuse, homelessness.  Right-sided chest pain around the port Patient initially presented to the emergency department because he was having pain around his port site.  He has his port changed every Tuesday and reports that he has never had any issues with it but that he was told to watch out for signs of infection and he is concerned it is getting infected.  There is no erythema or edema around the port site.  Patient has remained afebrile overnight.  Is still complaining of pain in his right-sided chest.  There was plan to remove right-sided port and insert left-sided port.  Update-patient's blood cultures collected on admission are growing enterococci.  ID has been consulted.  Plan for port holiday, patient will receive dialysis tomorrow morning and then will be on holiday until Friday 7/2. -Nephrology consulted, appreciate recommendations -Infectious disease consulted appreciate recommendations -Vancomycin started per infectious disease -TTE to rule out endocarditis -Planning on imaging of right lower extremity to rule out osteomyelitis -Line holiday per nephrology pending dialysis tomorrow morning -Repeat blood cultures tomorrow after line removal -Follow-up on blood cultures for speciation  of Enterococcus   Type 2 diabetes Patient presented with blood glucoses in the high 600s.  He was initially started on insulin drip and on insulin drip was discontinued and he was transitioned to subcutaneous insulin at his home dose of 20 units with moderate sensitivity sliding scale.  His blood sugars have normalized down into the low 200s to high 100s. -Diabetes coordinator has evaluated the patient, appreciate recommendations -Continue Lantus 20 units daily -Continue moderate sensitivity sliding scale insulin -Morning BMPs -Social work consult to ensure has resources for medications  Hypertension Patient's home medications include amlodipine 10 mg daily, hydralazine 100 mg 3 times daily.  Blood pressures over the last 24 hours have ranged from 115/78-163/87. -Continue home medications at this time  Atrial flutter s/p ablation Patient's home medication include amiodarone and apixaban -Continue home amiodarone and apixaban -Continuous cardiac monitoring  Hyperlipidemia No recent lipid panel on file.  Patient is on atorvastatin 40 mg daily. -Continue statin  Bipolar disorder Patient on Abilify 5 mg daily -Continue Abilify  CHF Per chart review CHF is listed in the patient's problem list but I am unable to find records of CHF diagnosis.  There are no available echo results indicating whether he has HFpEF or HFrEF.  There are also no appointments visible with cardiology.  Patient does have crackles in lung bases on exam but given he has missed 2 dialysis appointments this is most likely the cause.  Current heart failure medications include Bumex 2 mg twice daily on all days not at dialysis, carvedilol 12.5 mg daily. -We will monitor fluid status -Reassess after dialysis appointment -If clinically worsens consider cardiac echo  GERD Home medications include Pepcid and Protonix -Continue home Pepcid and Protonix  Polysubstance abuse Per chart review patient has  history of  positive UDS for cocaine and THC. -Social work consult -Monitor for signs of withdrawal  Hx of Homelessness Per chart review patient has history of homelessness.  Patient reports that he has not homeless at this time and that he is moving back to [facility] soon.  Patient does acknowledge that he missed his dialysis appointment because his ride to the dialysis appointment fell through. -Social work consulted for confirmation that patient has stable living situation  Buttocks wound Patient reports that he was prescribed doxycycline for this and that he is taking it.  He reports that it was drained by the emergency room doctors twice.  On exam it does not appear infected it appears to be healing well.  Of note the prescriptions for the doxycycline were not picked up from the pharmacy. -Wound care has been consulted, appreciate recommendations -Holding antibiotics at this time  FEN/GI: Carb modified PPx: Lovenox  Disposition: Discharge will be at least Friday.  Patient will take IV holiday starting tomorrow morning after dialysis, port will be reestablished planning for Friday.  Subjective:  Patient is on dialysis my evaluate him.  He is snoring but easily arousable.  He reports he is still having right-sided pain around his port and would like something stronger than Tylenol for pain.  Specifically asks for narcotics for the pain.  Denies any other issues at this time.  Objective: Temp:  [97.6 F (36.4 C)-98.8 F (37.1 C)] 98.8 F (37.1 C) (06/28 0508) Pulse Rate:  [88-103] 88 (06/28 0508) Resp:  [12-20] 18 (06/28 0508) BP: (118-179)/(83-128) 132/83 (06/28 0508) SpO2:  [93 %-99 %] 97 % (06/28 0508) Weight:  [122.5 kg] 122.5 kg (06/27 1113) Physical Exam: General: Sleeping on dialysis when I evaluate the patient.  Easily arousable but complains of pain Cardiovascular: Regular heart rate and rhythm, no murmurs noted Respiratory: Normal work of breathing with lungs clear to  auscultation Abdomen: Soft, nontender, positive bowel sounds Extremities: Patient has healing wound on right lower extremity s/p bilateral BKA's.  Patient reports the wound has been healing since he received his BKA approximately a year ago.  Laboratory: Recent Labs  Lab 06/09/20 1135 06/09/20 1430  WBC 11.4*  --   HGB 8.7* 10.5*  HCT 29.7* 31.0*  PLT 285  --    Recent Labs  Lab 06/09/20 1135 06/09/20 1135 06/09/20 1430 06/09/20 2121 06/10/20 0444  NA 127*   < > 130* 132* 131*  K 4.3   < > 4.3 4.1 4.1  CL 95*  --   --  98 98  CO2 16*  --   --  19* 18*  BUN 53*  --   --  54* 56*  CREATININE 10.10*  --   --  10.36* 10.81*  CALCIUM 8.2*  --   --  8.5* 8.3*  GLUCOSE 695*  --   --  277* 227*   < > = values in this interval not displayed.   Blood cultures-positive for Enterococcus, awaiting speciation  Imaging/Diagnostic Tests: DG Chest 2 View  Result Date: 06/09/2020 CLINICAL DATA:  Chest pain adjacent to a right subclavian port, with pain radiating to the neck. Dialysis patient. EXAM: CHEST - 2 VIEW COMPARISON:  05/22/2020 FINDINGS: Cardiac silhouette borderline enlarged. No mediastinal or hilar masses. Mild thickening of the fissures without convincing interstitial edema. No lung opacities to suggest pneumonia. Small bilateral pleural effusions new since the prior exam. No pneumothorax. Tunneled right anterior chest wall, internal jugular dual lumen central venous catheter  appears unchanged from the previous exam, distal tip in the right atrium. Skeletal structures are grossly intact. IMPRESSION: 1. No evidence of pneumonia or pulmonary edema. 2. Small pleural effusions which are new since the prior study. No other change. 3. Stable right internal jugular tunneled dual lumen central venous catheter. Electronically Signed   By: Lajean Manes M.D.   On: 06/09/2020 12:46     Gifford Shave, MD 06/10/2020, 5:58 AM PGY-1, [place] Intern pager: [phone],  text pages welcome

## 2020-06-10 NOTE — Progress Notes (Signed)
Inpatient Diabetes Program Recommendations  AACE/ADA: New Consensus Statement on Inpatient Glycemic Control (2015)  Target Ranges:  Prepandial:   less than 140 mg/dL      Peak postprandial:   less than 180 mg/dL (1-2 hours)      Critically ill patients:  140 - 180 mg/dL   Lab Results  Component Value Date   GLUCAP 174 (H) 06/10/2020    Review of Glycemic Control Results for Lucas [name], Lucas [name] (MRN [id number]) as of 06/10/2020 12:50  Ref. Range 06/10/2020 07:29 06/10/2020 11:47  Glucose-Capillary Latest Ref Range: 70 - 99 mg/dL 178 (H) 174 (H)   Diabetes history: DM 2 Outpatient Diabetes medications: Lantus 25 units Daily, Humalog 2-12 units tid Current orders for Inpatient glycemic control:  Lantus 20 units Novolog 0-15 units tid  A1c pending, however, will be inaccurate due to Hgb being 7.5  Spoke with pt at bedside in HD. Pt reports he is able to get his insulins but is lazy and does not take them consistently. Spoke to pt briefly about importance of glucose control and consistently taking insulin.  Pt report needing a glucose meter at time of d/c (order # [id number]). Pt says he will get his refills most likely at [facility]. Pt reports just recently establishing care with a new PCP on [address] But does not remember the name. Pt will be living with a friend in [place]. Has [employer].  Thanks,  Tama Headings RN, MSN, BC-ADM Inpatient Diabetes Coordinator Team Pager [phone] (8a-5p)

## 2020-06-10 NOTE — Plan of Care (Signed)
Outpatient HD orders:   [facility] unit  Phone [phone] Scheduled TTS 4 hours  His outpatient unit reports noncompliance   BF 400  DF 800 F250 dialyzer  2/2.5 ca bath EDW 250 lbs Last weight 272.2 lbs on 6/24 (signed off early and had skipped the treatment prior) aranesp 120 mcg weekly  hectoral 4 mcg each tx   Claudia Desanctis, MD 10:59 AM  11:00 AM

## 2020-06-10 NOTE — Progress Notes (Signed)
PHARMACY - PHYSICIAN COMMUNICATION CRITICAL VALUE ALERT - BLOOD CULTURE IDENTIFICATION (BCID)  Lucas [name] is an 49 y.o. male who presented to [facility] on 06/09/2020 with a chief complaint of pain around his port.   Assessment:  49 year old male with hx of ESRD admitted with some pain around his port/HD cath. Now with enterococcus in blood cultures.   Name of physician (or Provider) Contacted: FMTS and ID   Current antibiotics: None   Changes to prescribed antibiotics recommended:  Recommend starting Vanc with HD   Results for orders placed or performed during the hospital encounter of 06/09/20  Blood Culture ID Panel (Reflexed) (Collected: 06/09/2020  9:22 PM)  Result Value Ref Range   Enterococcus species DETECTED (A) NOT DETECTED   Vancomycin resistance NOT DETECTED NOT DETECTED   Listeria monocytogenes NOT DETECTED NOT DETECTED   Staphylococcus species NOT DETECTED NOT DETECTED   Staphylococcus aureus (BCID) NOT DETECTED NOT DETECTED   Streptococcus species NOT DETECTED NOT DETECTED   Streptococcus agalactiae NOT DETECTED NOT DETECTED   Streptococcus pneumoniae NOT DETECTED NOT DETECTED   Streptococcus pyogenes NOT DETECTED NOT DETECTED   Acinetobacter baumannii NOT DETECTED NOT DETECTED   Enterobacteriaceae species NOT DETECTED NOT DETECTED   Enterobacter cloacae complex NOT DETECTED NOT DETECTED   Escherichia coli NOT DETECTED NOT DETECTED   Klebsiella oxytoca NOT DETECTED NOT DETECTED   Klebsiella pneumoniae NOT DETECTED NOT DETECTED   Proteus species NOT DETECTED NOT DETECTED   Serratia marcescens NOT DETECTED NOT DETECTED   Haemophilus influenzae NOT DETECTED NOT DETECTED   Neisseria meningitidis NOT DETECTED NOT DETECTED   Pseudomonas aeruginosa NOT DETECTED NOT DETECTED   Candida albicans NOT DETECTED NOT DETECTED   Candida glabrata NOT DETECTED NOT DETECTED   Candida krusei NOT DETECTED NOT DETECTED   Candida parapsilosis NOT DETECTED NOT DETECTED   Candida  tropicalis NOT DETECTED NOT DETECTED    Jimmy Footman, PharmD, BCPS, BCIDP Infectious Diseases Clinical Pharmacist Phone: [phone] 06/10/2020  2:47 PM

## 2020-06-10 NOTE — Progress Notes (Addendum)
[facility] Progress Note  Name: Lucas [name] MRN: [id number] DOB: [birth year]   Subjective:  Seen and examined on dialysis; procedure supervised.  Blood pressure 144/100 and HR 95.  Tolerating goal.  RIJ catheter.  [facility].  States he was told catheter would be moved.   ------- Background on consult:  Lucas [name] is an 49 y.o. male DM HTN CHF GERD Polysubstance abuse HLD aflutter s/ ablation (on amiodarone and apixaban) ESRD TTS in [facility] presenting with pain starting past 24hrs in the right side of chest over port radiating to the neck and worse with palpation or movement. No associated with exertion but he states he's had intermittent fevers/ chills. Denies n/v/ abdominal pain. He also denies headaches, syncopal episodes or sick contacts. He is homeless and recently lost his housing now going to move in with friend [name] in [place]. His last dialysis treatment was on Thursday in [place]. He has recently had drainage of a buttocks abscess x2 and is supposed to be on doxycycline for that wound.    Intake/Output Summary (Last 24 hours) at 06/10/2020 0824 Last data filed at 06/10/2020 0600 Gross per 24 hour  Intake 520 ml  Output 0 ml  Net 520 ml    Vitals:  Vitals:   06/09/20 2000 06/09/20 2102 06/10/20 0123 06/10/20 0508  BP: (!) 141/99 (!) 140/108 118/89 132/83  Pulse: 100 (!) 103 90 88  Resp: 16 18 18 18   Temp:  97.6 F (36.4 C) 98 F (36.7 C) 98.8 F (37.1 C)  TempSrc:  Oral    SpO2: 95% 93% 95% 97%  Weight:      Height:         Physical Exam:  General adult male in bed in no acute distress HEENT normocephalic atraumatic extraocular movements intact sclera anicteric Neck supple trachea midline Lungs clear to auscultation bilaterally normal work of breathing at rest  Heart S1S2 no rub Abdomen soft nontender nondistended Extremities no pitting edema  Psych normal mood and affect Neuro - alert and oriented x3 sleeping on  arrival Access RIJ tunneled catheter is very tender   Medications reviewed   Labs:  BMP Latest Ref Rng & Units 06/10/2020 06/10/2020 06/09/2020  Glucose 70 - 99 mg/dL 210(H) 227(H) 277(H)  BUN 6 - 20 mg/dL 57(H) 56(H) 54(H)  Creatinine 0.61 - 1.24 mg/dL 11.15(H) 10.81(H) 10.36(H)  Sodium 135 - 145 mmol/L 131(L) 131(L) 132(L)  Potassium 3.5 - 5.1 mmol/L 4.0 4.1 4.1  Chloride 98 - 111 mmol/L 98 98 98  CO2 22 - 32 mmol/L 19(L) 18(L) 19(L)  Calcium 8.9 - 10.3 mg/dL 8.2(L) 8.3(L) 8.5(L)     Assessment/Plan:   1Hyperglycemia with levels above 600 - per primary team   2 ESRD: - HD today as missed last tx; then will transition back to his reported TTS schedule for tx on 6/29 as well  - He has been informed that he has to go to dialysis at his home unit and if he wishes to change to a different unit they will need to get him set up.  3  Pain around port - questionable infection; WBC 11.4 and no calor or pus expressed but the exquisite tenderness around catheter is concerning.  - blood culture pending - reasonable to move the dialysis catheter - will consult IR  - NPO after 12:01 on 6/29.  - hold eliquis 5 mg BID for now   4. Anemia of ESRD:  Now with anemia.  Obtain outpt regimen   5. Metabolic Bone Disease:On sevelamer 1600 mg 3 times daily at home. continue   6. Hypertension: - controlled on current regimen   7. buttock abscess: Status post drainage in the ER x2 previously; is supposed to be on doxycycline per charting.  Per primary team. Needs to follow-up with wound carein [place].   Claudia Desanctis, MD 06/10/2020 8:44 AM   Addendum.  Notified by team that patient has enterococcus bacteremia.  They have consulted ID and he is to receive vanc.  I recommended a TTE.    With regard to dialysis, spoke with IR.  Will plan for HD on 6/29 first shift and then IR to remove tunneled dialysis catheter for a line holiday.  Will then assess dialysis needs daily and plan for  replacement of tunneled catheter at new site vs. nontunneled catheter followed by a tunneled catheter per clinical course.   Also see plan of care note with HD orders.    Claudia Desanctis 06/10/2020  3:42 PM

## 2020-06-10 NOTE — Progress Notes (Signed)
Pharmacy Antibiotic Note  Lucas [name] is a 49 y.o. male admitted on 06/09/2020 with pain around his port. Pt with PMH of ESRD on HD TThS. Pt now found to have enterococcal bacteremia per blood cx. Pharmacy has been consulted for vancomycin dosing. WBC elevated, patient afebrile. ID consulted and following.    Plan: Vancomycin IV 2250 mg x 1 dose. Will follow for next HD session and re-dose post session. Target pre-HD level 15-25 mcg/mL.  Height: 6\' 4"  (193 cm) Weight: 127.7 kg (281 lb 8 oz) IBW/kg (Calculated) : 86.8  Temp (24hrs), Avg:98.3 F (36.8 C), Min:97.6 F (36.4 C), Max:98.8 F (37.1 C)  Recent Labs  Lab 06/09/20 1135 06/09/20 2121 06/10/20 0444 06/10/20 0729  WBC 11.4*  --   --  12.4*  CREATININE 10.10* 10.36* 10.81* 11.15*    Estimated Creatinine Clearance: 11.8 mL/min (A) (by C-G formula based on SCr of 11.15 mg/dL (H)).    No Known Allergies  Antimicrobials this admission: Vancomycin IV 06/10/20 >>   Microbiology results: 06/10/20 BCx: enterococcus 06/09/20 MRSA PCR: positive  Thank you for allowing pharmacy to be a part of this patient's care.  Elita Quick, PharmD Candidate 06/10/2020 3:04 PM

## 2020-06-10 NOTE — Consult Note (Signed)
WOC Nurse Consult Note: Patient receiving care in [facility] [phone]. Reason for Consult: RLE and left buttock wounds Wound type: left buttock is a healing I&D site. RLE is a healing surgical stump site. Pressure Injury POA: Yes/No/NA Measurement: left buttock wound is 100% pink and measures 2 cm x 1 cm. This area can be managed nicely with soap and water and a size appropriate foam dressing. Wound bed: the RLE surgical incision site is dried and crusty.  The patient states he normally puts a saline moistened dressing over the area.  It measures 2 cm x 7.2 cm x 0.3 cm Drainage (amount, consistency, odor) none for either Periwound: intact for both Dressing procedure/placement/frequency: Wash the right stump wound with soap and water, pat dry. Place a narrow saline moistened gauze over the wound, to with dry gauze and tape in place. Monitor the wound area(s) for worsening of condition such as: Signs/symptoms of infection,  Increase in size,  Development of or worsening of odor, Development of pain, or increased pain at the affected locations.  Notify the medical team if any of these develop.  Thank you for the consult.  Discussed plan of care with the patient.  [facility] nurse will not follow at this time.  Please re-consult the [facility] team if needed.  Val Riles, RN, MSN, CWOCN, CNS-BC, pager [phone]

## 2020-06-11 ENCOUNTER — Inpatient Hospital Stay (HOSPITAL_COMMUNITY): Payer: Medicaid Other

## 2020-06-11 ENCOUNTER — Encounter (HOSPITAL_COMMUNITY): Payer: Self-pay | Admitting: Family Medicine

## 2020-06-11 DIAGNOSIS — I340 Nonrheumatic mitral (valve) insufficiency: Secondary | ICD-10-CM

## 2020-06-11 DIAGNOSIS — I058 Other rheumatic mitral valve diseases: Secondary | ICD-10-CM

## 2020-06-11 DIAGNOSIS — I361 Nonrheumatic tricuspid (valve) insufficiency: Secondary | ICD-10-CM

## 2020-06-11 DIAGNOSIS — I059 Rheumatic mitral valve disease, unspecified: Secondary | ICD-10-CM | POA: Diagnosis present

## 2020-06-11 HISTORY — PX: IR REMOVAL TUN CV CATH W/O FL: IMG2289

## 2020-06-11 LAB — GLUCOSE, CAPILLARY
Glucose-Capillary: 105 mg/dL — ABNORMAL HIGH (ref 70–99)
Glucose-Capillary: 105 mg/dL — ABNORMAL HIGH (ref 70–99)
Glucose-Capillary: 209 mg/dL — ABNORMAL HIGH (ref 70–99)
Glucose-Capillary: 209 mg/dL — ABNORMAL HIGH (ref 70–99)

## 2020-06-11 LAB — HEMOGLOBIN A1C
Hgb A1c MFr Bld: 11.5 % — ABNORMAL HIGH (ref 4.8–5.6)
Mean Plasma Glucose: 283 mg/dL

## 2020-06-11 LAB — BASIC METABOLIC PANEL
Anion gap: 12 (ref 5–15)
BUN: 41 mg/dL — ABNORMAL HIGH (ref 6–20)
CO2: 22 mmol/L (ref 22–32)
Calcium: 8.1 mg/dL — ABNORMAL LOW (ref 8.9–10.3)
Chloride: 99 mmol/L (ref 98–111)
Creatinine, Ser: 8.22 mg/dL — ABNORMAL HIGH (ref 0.61–1.24)
GFR calc Af Amer: 8 mL/min — ABNORMAL LOW (ref >60)
GFR calc non Af Amer: 7 mL/min — ABNORMAL LOW (ref >60)
Glucose, Bld: 111 mg/dL — ABNORMAL HIGH (ref 70–99)
Potassium: 3.5 mmol/L (ref 3.5–5.1)
Sodium: 133 mmol/L — ABNORMAL LOW (ref 135–145)

## 2020-06-11 LAB — HEPATITIS B SURFACE ANTIGEN: Hepatitis B Surface Ag: NONREACTIVE

## 2020-06-11 MED ORDER — VANCOMYCIN HCL IN DEXTROSE 1-5 GM/200ML-% IV SOLN
INTRAVENOUS | Status: AC
  Administered 2020-06-11: 1000 mg via INTRAVENOUS
  Filled 2020-06-11: qty 200

## 2020-06-11 MED ORDER — HYDROMORPHONE HCL 2 MG PO TABS
1.0000 mg | ORAL_TABLET | Freq: Once | ORAL | Status: AC
  Administered 2020-06-11: 1 mg via ORAL
  Filled 2020-06-11: qty 1

## 2020-06-11 MED ORDER — LIDOCAINE HCL 1 % IJ SOLN
INTRAMUSCULAR | Status: AC | PRN
  Administered 2020-06-11: 20 mL

## 2020-06-11 MED ORDER — CHLORHEXIDINE GLUCONATE 4 % EX LIQD
CUTANEOUS | Status: AC
  Filled 2020-06-11: qty 15

## 2020-06-11 MED ORDER — GENTAMICIN IN SALINE 1.6-0.9 MG/ML-% IV SOLN: 80.0000 mg | INTRAVENOUS | Status: DC

## 2020-06-11 MED ORDER — HEPARIN SODIUM (PORCINE) 5000 UNIT/ML IJ SOLN
5000.0000 [IU] | Freq: Three times a day (TID) | INTRAMUSCULAR | Status: DC
  Administered 2020-06-11 – 2020-06-17 (×14): 5000 [IU] via SUBCUTANEOUS
  Filled 2020-06-11 (×16): qty 1

## 2020-06-11 MED ORDER — DOXERCALCIFEROL 4 MCG/2ML IV SOLN
INTRAVENOUS | Status: AC
  Administered 2020-06-11: 4 ug
  Filled 2020-06-11: qty 2

## 2020-06-11 MED ORDER — LIDOCAINE HCL 1 % IJ SOLN
INTRAMUSCULAR | Status: AC
  Filled 2020-06-11: qty 20

## 2020-06-11 MED ORDER — DOXERCALCIFEROL 2.5 MCG PO CAPS
4.0000 ug | ORAL_CAPSULE | ORAL | Status: DC
  Administered 2020-06-15 – 2020-07-06 (×5): 4 ug via ORAL
  Filled 2020-06-11 (×13): qty 3

## 2020-06-11 MED ORDER — GENTAMICIN IN SALINE 1.2-0.9 MG/ML-% IV SOLN
120.0000 mg | Freq: Once | INTRAVENOUS | Status: AC
  Administered 2020-06-11: 120 mg via INTRAVENOUS
  Filled 2020-06-11: qty 50
  Filled 2020-06-11: qty 100

## 2020-06-11 MED ORDER — FAMOTIDINE 20 MG PO TABS
20.0000 mg | ORAL_TABLET | Freq: Every day | ORAL | Status: DC
  Administered 2020-06-12: 20 mg via ORAL
  Filled 2020-06-11: qty 1

## 2020-06-11 MED ORDER — VANCOMYCIN HCL IN DEXTROSE 1-5 GM/200ML-% IV SOLN: 1000.0000 mg | Freq: Once | INTRAVENOUS | Status: AC

## 2020-06-11 MED ORDER — HEPARIN SODIUM (PORCINE) 1000 UNIT/ML IJ SOLN
INTRAMUSCULAR | Status: AC
  Administered 2020-06-11: 3800 [IU] via INTRAVENOUS_CENTRAL
  Filled 2020-06-11: qty 4

## 2020-06-11 NOTE — Progress Notes (Signed)
  Echocardiogram 2D Echocardiogram has been performed.  Jennette Dubin 06/11/2020, 2:38 PM

## 2020-06-11 NOTE — Plan of Care (Signed)
  Problem: Nutritional: Goal: Ability to make healthy dietary choices will improve Outcome: Progressing   Problem: Skin Integrity: Goal: Risk for impaired skin integrity will decrease Outcome: Progressing   Problem: Pain Managment: Goal: General experience of comfort will improve Outcome: Progressing   Problem: Clinical Measurements: Goal: Diagnostic test results will improve Outcome: Progressing   Problem: Clinical Measurements: Goal: Will remain free from infection Outcome: Progressing

## 2020-06-11 NOTE — Progress Notes (Signed)
[facility] Progress Note  Name: Lucas [name] MRN: [id number] DOB: 1971/10/15   Subjective:  Had HD on 6/28 with 3 kg UF.  Notified per team of bacteremia and spoke with IR on 6/28 with plans for HD first shift today.  He states catheter still very tender.  For HD shortly.   Review of systems: reports nausea and shortness of breath.  discomfort around catheter site.    ------- Background on consult:  Lucas [name] is an 49 y.o. male DM HTN CHF GERD Polysubstance abuse HLD aflutter s/ ablation (on amiodarone and apixaban) ESRD TTS in [facility] presenting with pain starting past 24hrs in the right side of chest over port radiating to the neck and worse with palpation or movement. No associated with exertion but he states he's had intermittent fevers/ chills. Denies n/v/ abdominal pain. He also denies headaches, syncopal episodes or sick contacts. He is homeless and recently lost his housing now going to move in with friend Lucas [name] in [place]. His last dialysis treatment was on Thursday in [place]. He has recently had drainage of a buttocks abscess x2 and is supposed to be on doxycycline for that wound.    Intake/Output Summary (Last 24 hours) at 06/11/2020 0651 Last data filed at 06/11/2020 0455 Gross per 24 hour  Intake 600 ml  Output 3100 ml  Net -2500 ml    Vitals:  Vitals:   06/10/20 1707 06/10/20 2050 06/10/20 2330 06/11/20 0455  BP: 115/78 (!) 144/75  122/84  Pulse:  99  85  Resp:  18  18  Temp:  (!) 100.6 F (38.1 C) 99.9 F (37.7 C) 98 F (36.7 C)  TempSrc:   Oral   SpO2:  98%  100%  Weight:      Height:         Physical Exam:   General adult male in bed in no acute distress HEENT normocephalic atraumatic extraocular movements intact sclera anicteric Neck supple trachea midline Lungs clear to auscultation bilaterally normal work of breathing at rest  Heart S1S2 no rub Abdomen soft nontender nondistended Extremities 1+ edema resid lower extremities Psych  normal mood and affect Neuro - alert and oriented x3 conversant and follows commands Access RIJ tunneled catheter is very tender   Medications reviewed   Labs:  BMP Latest Ref Rng & Units 06/10/2020 06/10/2020 06/09/2020  Glucose 70 - 99 mg/dL 210(H) 227(H) 277(H)  BUN 6 - 20 mg/dL 57(H) 56(H) 54(H)  Creatinine 0.61 - 1.24 mg/dL 11.15(H) 10.81(H) 10.36(H)  Sodium 135 - 145 mmol/L 131(L) 131(L) 132(L)  Potassium 3.5 - 5.1 mmol/L 4.0 4.1 4.1  Chloride 98 - 111 mmol/L 98 98 98  CO2 22 - 32 mmol/L 19(L) 18(L) 19(L)  Calcium 8.9 - 10.3 mg/dL 8.2(L) 8.3(L) 8.5(L)    Outpatient HD orders:   [facility] Phone [phone] Scheduled TTS 4 hours His outpatient unit reports noncompliance BF 400; DF 800 F250 dialyzer  2/2.5 ca bath EDW 250 lbs Last weight 272.2 lbs on 6/24 (signed off early and had skipped the treatment prior) aranesp 120 mcg weekly  hectoral 4 mcg each tx    Assessment/Plan:   1. Enterococcus bacteremia  - Will plan for HD on 6/29 first shift and then IR to remove tunneled dialysis catheter for a line holiday.  Will then assess dialysis needs daily and plan for replacement of tunneled catheter at new site vs. nontunneled catheter followed by a tunneled catheter per clinical course.  -  ID was consulted - appreciate ID and primary team  - on vanc per primary team  - hold eliquis 5 mg BID for now  - recommend TTE/TEE   2 ESRD: - HD on 6/29 per TTS schedule - first shift then catheter removal  - He has been informed that he has to go to dialysis at his home unit and if he wishes to change to a different unit they will need to get him set up - 6/29 labs pending  3. Anemia of ESRD:  For ESA every Thursday - increased aranesp to 150 mcg every thursday. Normally aranesp as outpatient as above  4. Metabolic Bone Disease:On sevelamer 1600 mg 3 times daily at home - continue. Continue hectorol    5. Hypertension:  - controlled on  current regimen   6DM with hyperglycemia - per primary team   7. buttock abscess: Status post drainage in the ER x2 previously; is supposed to be on doxycycline per charting.  Per primary team. Needs to follow-up with wound carein [place].   Claudia Desanctis, MD 06/11/2020 7:08 AM

## 2020-06-11 NOTE — Hospital Course (Addendum)
Lucas [name] is a 49 y.o. male presenting with hyperglycemia and pain in his chest surrounding the right subclavian port which radiates into his neck. PMH is significant for atrial flutter s/p ablation, T2DM, ESRD, hyperlipidemia, anemia, CHF, GERD, polysubstance abuse, homelessness.  Below is his hospital course by problem.  Right-sided chest pain around port Patient initially presented to the emergency department because he was having pain around his port site.  He has his port changed every Tuesday and reports that he has never had any issues with it but that he was told to watch out for signs of infection and he is concerned it is getting infected.  There was no edema or erythema or warmth to touch around patient's port site but given the concern nephrology was consulted for evaluation.  They recommended dialyzing the patient given he had missed a session of dialysis.  Blood cultures were collected but given no elevated white count, no fever, no signs of infection other than the pain antibiotics were held at that time.  On 6/28 patient's initial blood cultures grew enteric coccus, ID was consulted who recommended a TTE to evaluate for endocarditis and starting patient on vancomycin.  Vancomycin was started and TEE showed vegetations on mitral valve.  Vegetation was found to not be susceptible to gentamicin.   [name], [name], [name] [name] of vancomycin was discontinued 7/1 and replaced with IV Daptomycin and Oral Linezolid for inpatient treatment.  7/3- ID, signed off and will f/u with patient 7/26.  Patient was placed on HD holiday 6/29-7/1. Patient Right sided IJD catheter was removed 6/29. Patient was treated for pain. Initial pain  treatment was with Dilaudid (6/27-6/29) tramadol twice daily PRN ( 6/30-) 7/2 the patient received new left nontunneled IJ catheter and restarted HD and a long term tunneled IJ cath on 7/6. Patient had some bleeding after the procedure, IR sutured at the bedside. Patient reported having some r bicep pain in a cord-like pattern on the medical aspect of his arm, DVT US of the area was negative, ruling out clot.  Right shoulder pain Patient continued to report right shoulder pain that spread into the neck along the sternocleidomastoid and made swallowing difficult. Patient received x-ray of the shoulder that was negative for acute abnormalities. The patient responded well to Volatern gel to the area for a few days then later required a few doses of oxycodone for the pain to provide adequate relief in order for him to sleep. ***Gabapentin was increased to 300 mg for additional pain control.   Type 2 diabetes mellitus Patient presented hyperglycemic and was started on insulin drip in the ED; however the patient did not appear to be in DKA nor HHS and was transitioned to SQ insulin and a diet. Patient was placed on sensitive sliding scale and Lantus 20 U daily. We continued to monitor Glucose. Patient A1c was markedly elevated. Patient continued to need significant amounts of SAI and 7/1 LAI was increased from 20 to 25U. Patient glucose levels remained stable with this regimen during his stay*** Hypertension Continue patient home medications  Hyperlipidemia Continue statin  Atrial flutter s/p ablation Continue home amiodarone Initially held home apixaban while hospitalized due to PICC line removal. Later apixiban was added.   Bipolar disorder Continue Abilify  Neuropathy 7/2- Patient reported he had a hx of neuropathy and would like to switched to gabapentin from pregabalin as he reports that he had better success with this in the past.  CHF  GERD  Polysubstance abuse/ Cultural substance  use Patient has a history of substance use and it was not suggested that he be discharged with IV access that is necessary for his abx treatment. Patient has been allotted supervised outdoor time when staff finds time to accompany him. 7/5- Overnight patient was discovered by nurse with white clay around his mouth. Per patient this is clay from [facility], that his family ingests to get vitamins and nutrients. This is a cultural substance use. Patient still had the bag in his room, which among [place] print did say this is was " Lucas [name]." After physician spoke with patient about the source he began to question if the clay was really the same as what his family used and decided to have it take out of his room and placed with his possessions.   Buttocks wound

## 2020-06-11 NOTE — Procedures (Signed)
Successful removal of right IJ tunneled HD catheter.  After obtaining consent and performing a time-out, the right upper chest was prepped and draped in the normal sterile fashion. The heparin was removed from both ports. 1% lidocaine was used for local anesthesia. Using gentle blunt dissection the cuff of the catheter was exposed and the catheter was removed in its entirety. Pressure was held until hemostasis was obtained. A sterile dressing was applied. The patient tolerated the procedure well with no immediate complications.    Soyla Dryer, [place] [phone] 06/11/2020, 4:33 PM

## 2020-06-11 NOTE — Progress Notes (Signed)
Patient told this nurse that lying the bed is uncomfortable and that he would like knee by the bedside for  more comfort. Patient was advised that he is high fall risk and staying in that position without supervision is not safe for him. Patient still insisted. Will continue to monitor.   Dilan Novosad,RN.

## 2020-06-11 NOTE — Progress Notes (Signed)
[name] for Infectious Disease  Date of Admission:  06/09/2020     Total days of antibiotics 2         ASSESSMENT:  Lucas [name] has Enterococcal faecalis bacteremia now complicated by mitral valve endocarditis and significant mitral valve insufficiency noted on TTE today. Blood cultures are pending and line holiday awaiting IR removal of catheter. Will add gentamycin to vancomycin for Enterococcal endocarditis and obtain TEE. Monitor hearing while on vancomycin and gentamycin as they both have ototoxic side effect potential. Will need at least 6 weeks of IV therapy and will tentatively plan with dialysis.  PLAN:  1. Add gentamycin and continue vancomycin 2. TEE ordered for further evaluation of endocarditis. 3. Monitor hearing while on vancomycin and gentamycin.  4. Line holiday planned to start today after IR removal of catheter.  5. Will require at least 6 weeks of antibiotics and may need CVTS referral at completion of therapy.   Principal Problem:   Bacteremia due to Enterococcus Active Problems:   Hyperglycemia due to diabetes mellitus (HCC)   Anemia associated with stage 5 chronic renal failure (HCC)   End stage renal disease (HCC)   Endocarditis of mitral valve   . amiodarone  200 mg Oral Daily  . amLODipine  10 mg Oral Daily  . ARIPiprazole  5 mg Oral Daily  . atorvastatin  40 mg Oral Daily  . carvedilol  12.5 mg Oral BID  . Chlorhexidine Gluconate Cloth  6 each Topical Q0600  . Chlorhexidine Gluconate Cloth  6 each Topical Q0600  . [START ON 06/13/2020] darbepoetin (ARANESP) injection - DIALYSIS  150 mcg Intravenous Q Thu-HD  . doxercalciferol  4 mcg Oral Q T,Th,Sa-HD  . [START ON 06/12/2020] famotidine  20 mg Oral QHS  . heparin injection (subcutaneous)  5,000 Units Subcutaneous Q8H  . hydrALAZINE  100 mg Oral TID  . insulin aspart  0-15 Units Subcutaneous TID WC  . insulin glargine  20 Units Subcutaneous Daily  . mupirocin ointment  1 application Nasal BID   . pantoprazole  40 mg Oral Daily  . pregabalin  25 mg Oral QHS  . sevelamer carbonate  1,600 mg Oral TID with meals  . tamsulosin  0.4 mg Oral Daily    SUBJECTIVE:  Afebrile overnight with no acute events. Having chest pain and swelling around the catheter site. Requesting pain medication.   No Known Allergies   Review of Systems: Review of Systems  Constitutional: Negative for chills, fever and weight loss.  Respiratory: Negative for cough, shortness of breath and wheezing.   Cardiovascular: Positive for chest pain. Negative for leg swelling.  Gastrointestinal: Negative for abdominal pain, constipation, diarrhea, nausea and vomiting.  Skin: Negative for rash.      OBJECTIVE: Vitals:   06/11/20 1030 06/11/20 1100 06/11/20 1128 06/11/20 1237  BP: 136/90 (!) 175/92 (!) 178/96 (!) 141/89  Pulse: 84 92 95 95  Resp:   12 16  Temp:   99.6 F (37.6 C) 98.9 F (37.2 C)  TempSrc:   Oral Oral  SpO2:    98%  Weight:   128.3 kg   Height:       Body mass index is 34.42 kg/m.  Physical Exam Constitutional:      General: He is not in acute distress.    Appearance: He is well-developed.  Cardiovascular:     Rate and Rhythm: Normal rate and regular rhythm.     Heart sounds: Murmur heard.   Pulmonary:  Effort: Pulmonary effort is normal.     Breath sounds: Normal breath sounds.     Comments: Mild edema around cathter site with tenderness.  Skin:    General: Skin is warm and dry.  Neurological:     Mental Status: He is alert and oriented to person, place, and time.  Psychiatric:        Behavior: Behavior normal.        Thought Content: Thought content normal.        Judgment: Judgment normal.     Lab Results Lab Results  Component Value Date   WBC 12.4 (H) 06/10/2020   HGB 7.5 (L) 06/10/2020   HCT 24.3 (L) 06/10/2020   MCV 92.4 06/10/2020   PLT 292 06/10/2020    Lab Results  Component Value Date   CREATININE 8.22 (H) 06/11/2020   BUN 41 (H) 06/11/2020    NA 133 (L) 06/11/2020   K 3.5 06/11/2020   CL 99 06/11/2020   CO2 22 06/11/2020    Lab Results  Component Value Date   ALT 22 05/22/2020   AST 27 05/22/2020   ALKPHOS 243 (H) 05/22/2020   BILITOT 0.5 05/22/2020     Microbiology: Recent Results (from the past 240 hour(s))  SARS Coronavirus 2 by RT PCR (hospital order, performed in [place] hospital lab) Nasopharyngeal Nasopharyngeal Swab     Status: None   Collection Time: 06/09/20  3:11 PM   Specimen: Nasopharyngeal Swab  Result Value Ref Range Status   SARS Coronavirus 2 NEGATIVE NEGATIVE Final    Comment: (NOTE) SARS-CoV-2 target nucleic acids are NOT DETECTED.  The SARS-CoV-2 RNA is generally detectable in upper and lower respiratory specimens during the acute phase of infection. The lowest concentration of SARS-CoV-2 viral copies this assay can detect is 250 copies / mL. A negative result does not preclude SARS-CoV-2 infection and should not be used as the sole basis for treatment or other patient management decisions.  A negative result may occur with improper specimen collection / handling, submission of specimen other than nasopharyngeal swab, presence of viral mutation(s) within the areas targeted by this assay, and inadequate number of viral copies (<250 copies / mL). A negative result must be combined with clinical observations, patient history, and epidemiological information.  Fact Sheet for Patients:   StrictlyIdeas.no  Fact Sheet for Healthcare Providers: BankingDealers.co.za  This test is not yet approved or  cleared by the [place] FDA and has been authorized for detection and/or diagnosis of SARS-CoV-2 by FDA under an Emergency Use Authorization (EUA).  This EUA will remain in effect (meaning this test can be used) for the duration of the COVID-19 declaration under Section 564(b)(1) of the Act, 21 U.S.C. section 360bbb-3(b)(1), unless the  authorization is terminated or revoked sooner.  Performed at [facility], [place] [address], [place], [place] [address]   Culture, blood (single)     Status: Abnormal (Preliminary result)   Collection Time: 06/09/20  9:22 PM   Specimen: BLOOD RIGHT HAND  Result Value Ref Range Status   Specimen Description BLOOD RIGHT HAND  Final   Special Requests   Final    BOTTLES DRAWN AEROBIC AND ANAEROBIC Blood Culture adequate volume   Culture  Setup Time   Final    GRAM POSITIVE COCCI IN CLUSTERS AEROBIC BOTTLE ONLY IN BOTH AEROBIC AND ANAEROBIC BOTTLES CRITICAL RESULT CALLED TO, READ BACK BY AND VERIFIED WITH: PHARMD EMILY S [id number] FCP    Culture (A)  Final    ENTEROCOCCUS FAECALIS SUSCEPTIBILITIES TO FOLLOW Performed at [facility], [place] [address] [place], [place] [address]    Report Status PENDING  Incomplete  Blood Culture ID Panel (Reflexed)     Status: Abnormal   Collection Time: 06/09/20  9:22 PM  Result Value Ref Range Status   Enterococcus species DETECTED (A) NOT DETECTED Final    Comment: CRITICAL RESULT CALLED TO, READ BACK BY AND VERIFIED WITH: PHARMD EMILY S [id number] FCP    Vancomycin resistance NOT DETECTED NOT DETECTED Final   Listeria monocytogenes NOT DETECTED NOT DETECTED Final   Staphylococcus species NOT DETECTED NOT DETECTED Final   Staphylococcus aureus (BCID) NOT DETECTED NOT DETECTED Final   Streptococcus species NOT DETECTED NOT DETECTED Final   Streptococcus agalactiae NOT DETECTED NOT DETECTED Final   Streptococcus pneumoniae NOT DETECTED NOT DETECTED Final   Streptococcus pyogenes NOT DETECTED NOT DETECTED Final   Acinetobacter baumannii NOT DETECTED NOT DETECTED Final   Enterobacteriaceae species NOT DETECTED NOT DETECTED Final   Enterobacter cloacae complex NOT DETECTED NOT DETECTED Final   Escherichia coli NOT DETECTED NOT DETECTED Final   Klebsiella oxytoca NOT DETECTED NOT DETECTED Final   Klebsiella pneumoniae NOT DETECTED NOT  DETECTED Final   Proteus species NOT DETECTED NOT DETECTED Final   Serratia marcescens NOT DETECTED NOT DETECTED Final   Haemophilus influenzae NOT DETECTED NOT DETECTED Final   Neisseria meningitidis NOT DETECTED NOT DETECTED Final   Pseudomonas aeruginosa NOT DETECTED NOT DETECTED Final   Candida albicans NOT DETECTED NOT DETECTED Final   Candida glabrata NOT DETECTED NOT DETECTED Final   Candida krusei NOT DETECTED NOT DETECTED Final   Candida parapsilosis NOT DETECTED NOT DETECTED Final   Candida tropicalis NOT DETECTED NOT DETECTED Final    Comment: Performed at [facility], [address] [place], [place] [address]  MRSA PCR Screening     Status: Abnormal   Collection Time: 06/09/20 11:29 PM   Specimen: Nasal Mucosa; Nasopharyngeal  Result Value Ref Range Status   MRSA by PCR POSITIVE (A) NEGATIVE Final    Comment:        The GeneXpert MRSA Assay (FDA approved for NASAL specimens only), is one component of a comprehensive MRSA colonization surveillance program. It is not intended to diagnose MRSA infection nor to guide or monitor treatment for MRSA infections. RESULT CALLED TO, READ BACK BY AND VERIFIED WITH: ISAACS,T RN 0104 06/10/2020 MITCHELL,L Performed at [facility], [place] [address], [place], [place] [address]   Culture, blood (single)     Status: None (Preliminary result)   Collection Time: 06/10/20  1:25 PM   Specimen: BLOOD  Result Value Ref Range Status   Specimen Description BLOOD HEMODIALYSIS CATHETER  Final   Special Requests   Final    BOTTLES DRAWN AEROBIC AND ANAEROBIC Blood Culture results may not be optimal due to an excessive volume of blood received in culture bottles   Culture  Setup Time   Final    IN BOTH AEROBIC AND ANAEROBIC BOTTLES GRAM POSITIVE COCCI CRITICAL VALUE NOTED.  VALUE IS CONSISTENT WITH PREVIOUSLY REPORTED AND CALLED VALUE.    Culture   Final    NO GROWTH < 24 HOURS Performed at [facility], [place] [address], [place], [place] [address]    Report Status PENDING  Incomplete     Terri Piedra, [place] [facility]  06/11/2020  3:12 PM

## 2020-06-11 NOTE — Progress Notes (Signed)
Pharmacy Antibiotic Note  Lucas [name] is a 49 y.o. male admitted on 06/09/2020 with enterococcal bacteremia. Now found to have a mitral valve vegetation. Pharmacy has been consulted for gentamicin dosing for synergy. Patient is ESRD with HD TThSa. He had HD this AM. Estimated Adjusted Body Weight is ~100 kg.   Plan: Gentamicin 120 mg X 1 Then 80 mg after each HD session  Recommend an early pre-HD level Monitor HD sessions, TEE   Height: 6\' 4"  (193 cm) Weight: 128.3 kg (282 lb 12.8 oz) IBW/kg (Calculated) : 86.8  Temp (24hrs), Avg:99.2 F (37.3 C), Min:98 F (36.7 C), Max:100.6 F (38.1 C)  Recent Labs  Lab 06/09/20 1135 06/09/20 2121 06/10/20 0444 06/10/20 0729 06/11/20 0631  WBC 11.4*  --   --  12.4*  --   CREATININE 10.10* 10.36* 10.81* 11.15* 8.22*    Estimated Creatinine Clearance: 16.1 mL/min (A) (by C-G formula based on SCr of 8.22 mg/dL (H)).    No Known Allergies    Thank you for allowing pharmacy to be a part of this patient's care.  Jimmy Footman, PharmD, BCPS, BCIDP Infectious Diseases Clinical Pharmacist Phone: [phone] 06/11/2020 3:11 PM

## 2020-06-11 NOTE — Progress Notes (Signed)
Family Medicine Teaching Service Daily Progress Note Intern Pager: [phone]  Patient name: Lucas [name] Medical record number: [id number] Date of birth: 09/10/1971 Age: 49 y.o. Gender: male  Primary Care Provider: System, Pcp Not In Consultants: Nephrology, infectious disease Code Status: DNR  Pt Overview and Major Events to Date:  6/27-patient admitted for hyperglycemia and port pain 6/28-blood cultures grew Enterococcus  Assessment and Plan: Lucas [name] is a 49 y.o. male presenting with hyperglycemia and pain in his chest surrounding the right subclavian port which radiates into his neck. PMH is significant for atrial flutter s/p ablation, T2DM, ESRD, hyperlipidemia, anemia, CHF, GERD, polysubstance abuse, homelessness.  Right-sided chest pain around the port Patient initially presented to the emergency department because he was having pain around his port site.  He has his port changed every Tuesday and reports that he has never had any issues with it but that he was told to watch out for signs of infection and he is concerned it is getting infected.  There is no erythema or edema around the port site.  Patient has remained afebrile overnight.  Is still complaining of pain in his right-sided chest.  There was plan to remove right-sided port and insert left-sided port.  Update-patient's blood cultures collected on admission are growing enterococci.  ID has been consulted.  Plan for port holiday, patient will receive dialysis tomorrow morning and then will be on holiday until Friday 7/2. -Nephrology consulted, appreciate recommendations -Infectious disease consulted appreciate recommendations -Vancomycin started per infectious disease -TTE to rule out endocarditis -Planning on imaging of right lower extremity to rule out osteomyelitis -Line holiday per nephrology pending dialysis tomorrow morning -Repeat blood cultures tomorrow after line removal -Follow-up on blood cultures for speciation  of Enterococcus -One-time dose of hydromorphone p.o.  Type 2 diabetes Patient presented with blood glucoses in the high 600s.  He was initially started on insulin drip and on insulin drip was discontinued and he was transitioned to subcutaneous insulin at his home dose of 20 units with moderate sensitivity sliding scale.  His blood sugars have normalized down into the low 200s to high 100s. -Diabetes coordinator has evaluated the patient, appreciate recommendations -Continue Lantus 20 units daily -Continue moderate sensitivity sliding scale insulin -Morning BMPs -Social work consult to ensure has resources for medications  Hypertension Patient's home medications include amlodipine 10 mg daily, hydralazine 100 mg 3 times daily.  Blood pressures over the last 24 hours have ranged from 115/78-163/87. -Continue home medications at this time  Atrial flutter s/p ablation Patient's home medication include amiodarone and apixaban -Continue home amiodarone  -Holding apixaban at this time due to removal of PICC -Continuous cardiac monitoring  Hyperlipidemia No recent lipid panel on file.  Patient is on atorvastatin 40 mg daily. -Continue statin  Bipolar disorder Patient on Abilify 5 mg daily -Continue Abilify  CHF Per chart review CHF is listed in the patient's problem list but I am unable to find records of CHF diagnosis.  There are no available echo results indicating whether he has HFpEF or HFrEF.  There are also no appointments visible with cardiology.  Patient does have crackles in lung bases on exam but given he has missed 2 dialysis appointments this is most likely the cause.  Current heart failure medications include Bumex 2 mg twice daily on all days not at dialysis, carvedilol 12.5 mg daily. -We will monitor fluid status -Reassess after dialysis appointment -Cardiac echo ordered due to bacteremia  GERD Home medications include Pepcid and Protonix -  Continue home Pepcid and  Protonix  Polysubstance abuse Per chart review patient has history of positive UDS for cocaine and THC. -Social work consult -Monitor for signs of withdrawal  Hx of Homelessness Per chart review patient has history of homelessness.  Patient reports that he has not homeless at this time and that he is moving back to [facility] soon.  Patient does acknowledge that he missed his dialysis appointment because his ride to the dialysis appointment fell through. -Social work consulted for confirmation that patient has stable living situation  Buttocks wound Patient reports that he was prescribed doxycycline for this and that he is taking it.  He reports that it was drained by the emergency room doctors twice.  On exam it does not appear infected it appears to be healing well.  Of note the prescriptions for the doxycycline were not picked up from the pharmacy. -Wound care has been consulted, appreciate recommendations -Wet to dry -Holding antibiotics at this time  FEN/GI: Carb modified PPx: Heparin  Disposition: Discharge will be at least Friday.  Patient will take IV holiday starting tomorrow morning after dialysis, port will be reestablished planning for Friday.  Subjective:  Patient is on dialysis my evaluate him.  He has his mask over his eyes.  When asked how he is doing he says okay with no concerns.  He then realizes that I am the physician taking care of him and says "no, you are the doctor.  I need pain medication for my chest".  Denies any other concerns at this time.  We discussed his glycemic control and that his blood sugars have looked wonderful while on this dose of Lantus.  He says that he was just being lazy not taking his insulin at home but he will.  Objective: Temp:  [98 F (36.7 C)-100.6 F (38.1 C)] 98 F (36.7 C) (06/29 0455) Pulse Rate:  [85-101] 85 (06/29 0455) Resp:  [18-24] 18 (06/29 0455) BP: (115-163)/(75-100) 122/84 (06/29 0455) SpO2:  [94 %-100 %] 100 %  (06/29 0455) Weight:  [127.7 kg-131.5 kg] 127.7 kg (06/28 1115) Physical Exam: General: Sleeping with the mask on his face in dialysis unit.  Easily arousable Cardiovascular: Regular rate and rhythm, no murmurs noted Respiratory: Lungs are clear to auscultation bilaterally, normal work of breathing Abdomen: Soft, nontender, positive bowel sounds Extremities: Patient has healing wound on right lower extremity s/p bilateral BKA's.  Right lower extremity wrapped with Kerlix which patient reports is wet to dry underneath.  Laboratory: Recent Labs  Lab 06/09/20 1135 06/09/20 1430 06/10/20 0729  WBC 11.4*  --  12.4*  HGB 8.7* 10.5* 7.5*  HCT 29.7* 31.0* 24.3*  PLT 285  --  292   Recent Labs  Lab 06/09/20 2121 06/10/20 0444 06/10/20 0729  NA 132* 131* 131*  K 4.1 4.1 4.0  CL 98 98 98  CO2 19* 18* 19*  BUN 54* 56* 57*  CREATININE 10.36* 10.81* 11.15*  CALCIUM 8.5* 8.3* 8.2*  GLUCOSE 277* 227* 210*   Blood cultures-positive for Enterococcus, awaiting speciation  Imaging/Diagnostic Tests: DG Chest 2 View  Result Date: 06/09/2020 CLINICAL DATA:  Chest pain adjacent to a right subclavian port, with pain radiating to the neck. Dialysis patient. EXAM: CHEST - 2 VIEW COMPARISON:  05/22/2020 FINDINGS: Cardiac silhouette borderline enlarged. No mediastinal or hilar masses. Mild thickening of the fissures without convincing interstitial edema. No lung opacities to suggest pneumonia. Small bilateral pleural effusions new since the prior exam. No pneumothorax. Tunneled right anterior  chest wall, internal jugular dual lumen central venous catheter appears unchanged from the previous exam, distal tip in the right atrium. Skeletal structures are grossly intact. IMPRESSION: 1. No evidence of pneumonia or pulmonary edema. 2. Small pleural effusions which are new since the prior study. No other change. 3. Stable right internal jugular tunneled dual lumen central venous catheter. Electronically Signed    By: Lajean Manes M.D.   On: 06/09/2020 12:46   DG Knee Right Port  Result Date: 06/10/2020 CLINICAL DATA:  Right knee pain, history of below-knee amputation EXAM: PORTABLE RIGHT KNEE - 1-2 VIEW COMPARISON:  None. FINDINGS: Frontal and cross-table lateral views of the right knee are obtained. Heterotopic ossification is seen between the proximal tibia and fibula, compatible with postsurgical changes after below-knee amputation. No acute or destructive bony lesions. Mild 3 compartment osteoarthritis greatest in the patellofemoral compartment. No joint effusion. Mild diffuse subcutaneous edema. Extensive atherosclerosis. IMPRESSION: 1. Postsurgical changes from below-knee amputation. 2. Mild osteoarthritis. 3. Diffuse subcutaneous edema. Electronically Signed   By: Randa Ngo M.D.   On: 06/10/2020 19:28     Gifford Shave, MD 06/11/2020, 5:49 AM PGY-1, [place] Intern pager: [phone], text pages welcome

## 2020-06-11 NOTE — Procedures (Signed)
Seen and examined on dialysis at 8:33 am; procedure supervised.  Blood pressure 157/91 and HR 89.  RIJ tunneled catheter. Tolerating goal.    Claudia Desanctis, MD 06/11/2020 8:34 AM

## 2020-06-11 NOTE — Plan of Care (Signed)
  Problem: Clinical Measurements: Goal: Will remain free from infection Outcome: Progressing   Problem: Clinical Measurements: Goal: Diagnostic test results will improve Outcome: Progressing   Problem: Pain Managment: Goal: General experience of comfort will improve Outcome: Progressing   Problem: Skin Integrity: Goal: Risk for impaired skin integrity will decrease Outcome: Progressing

## 2020-06-12 LAB — BASIC METABOLIC PANEL
Anion gap: 11 (ref 5–15)
BUN: 32 mg/dL — ABNORMAL HIGH (ref 6–20)
CO2: 22 mmol/L (ref 22–32)
Calcium: 8.1 mg/dL — ABNORMAL LOW (ref 8.9–10.3)
Chloride: 97 mmol/L — ABNORMAL LOW (ref 98–111)
Creatinine, Ser: 6.38 mg/dL — ABNORMAL HIGH (ref 0.61–1.24)
GFR calc Af Amer: 11 mL/min — ABNORMAL LOW (ref >60)
GFR calc non Af Amer: 9 mL/min — ABNORMAL LOW (ref >60)
Glucose, Bld: 335 mg/dL — ABNORMAL HIGH (ref 70–99)
Potassium: 3.6 mmol/L (ref 3.5–5.1)
Sodium: 130 mmol/L — ABNORMAL LOW (ref 135–145)

## 2020-06-12 LAB — GLUCOSE, CAPILLARY
Glucose-Capillary: 293 mg/dL — ABNORMAL HIGH (ref 70–99)
Glucose-Capillary: 308 mg/dL — ABNORMAL HIGH (ref 70–99)
Glucose-Capillary: 262 mg/dL — ABNORMAL HIGH (ref 70–99)
Glucose-Capillary: 239 mg/dL — ABNORMAL HIGH (ref 70–99)
Glucose-Capillary: 261 mg/dL — ABNORMAL HIGH (ref 70–99)

## 2020-06-12 LAB — CBC
HCT: 25.3 % — ABNORMAL LOW (ref 39.0–52.0)
Hemoglobin: 7.7 g/dL — ABNORMAL LOW (ref 13.0–17.0)
MCH: 28.3 pg (ref 26.0–34.0)
MCHC: 30.4 g/dL (ref 30.0–36.0)
MCV: 93 fL (ref 80.0–100.0)
Platelets: 240 10*3/uL (ref 150–400)
RBC: 2.72 MIL/uL — ABNORMAL LOW (ref 4.22–5.81)
RDW: 17.4 % — ABNORMAL HIGH (ref 11.5–15.5)
WBC: 16.1 10*3/uL — ABNORMAL HIGH (ref 4.0–10.5)
nRBC: 0 % (ref 0.0–0.2)

## 2020-06-12 MED ORDER — RAMELTEON 8 MG PO TABS
8.0000 mg | ORAL_TABLET | Freq: Every day | ORAL | Status: DC
  Administered 2020-06-12 – 2020-06-14 (×3): 8 mg via ORAL
  Filled 2020-06-12 (×3): qty 1

## 2020-06-12 MED ORDER — INSULIN ASPART 100 UNIT/ML ~~LOC~~ SOLN
0.0000 [IU] | Freq: Every day | SUBCUTANEOUS | Status: DC
  Administered 2020-06-12 – 2020-06-13 (×2): 3 [IU] via SUBCUTANEOUS
  Administered 2020-06-14: 2 [IU] via SUBCUTANEOUS
  Administered 2020-06-15: 4 [IU] via SUBCUTANEOUS
  Administered 2020-06-17 – 2020-06-18 (×2): 2 [IU] via SUBCUTANEOUS
  Administered 2020-06-19: 3 [IU] via SUBCUTANEOUS
  Administered 2020-06-21 – 2020-07-06 (×7): 2 [IU] via SUBCUTANEOUS

## 2020-06-12 MED ORDER — INSULIN ASPART 100 UNIT/ML ~~LOC~~ SOLN
0.0000 [IU] | Freq: Three times a day (TID) | SUBCUTANEOUS | Status: DC
  Administered 2020-06-12 – 2020-06-13 (×2): 5 [IU] via SUBCUTANEOUS
  Administered 2020-06-13: 3 [IU] via SUBCUTANEOUS
  Administered 2020-06-14: 2 [IU] via SUBCUTANEOUS
  Administered 2020-06-15: 8 [IU] via SUBCUTANEOUS
  Administered 2020-06-15: 5 [IU] via SUBCUTANEOUS
  Administered 2020-06-16: 15 [IU] via SUBCUTANEOUS
  Administered 2020-06-16: 8 [IU] via SUBCUTANEOUS
  Administered 2020-06-16: 10 [IU] via SUBCUTANEOUS
  Administered 2020-06-17: 5 [IU] via SUBCUTANEOUS
  Administered 2020-06-17 (×2): 3 [IU] via SUBCUTANEOUS
  Administered 2020-06-18: 8 [IU] via SUBCUTANEOUS
  Administered 2020-06-18: 3 [IU] via SUBCUTANEOUS
  Administered 2020-06-18: 2 [IU] via SUBCUTANEOUS
  Administered 2020-06-19: 11 [IU] via SUBCUTANEOUS
  Administered 2020-06-19 (×2): 8 [IU] via SUBCUTANEOUS
  Administered 2020-06-20: 3 [IU] via SUBCUTANEOUS
  Administered 2020-06-20: 2 [IU] via SUBCUTANEOUS
  Administered 2020-06-21 – 2020-06-22 (×2): 3 [IU] via SUBCUTANEOUS
  Administered 2020-06-22: 8 [IU] via SUBCUTANEOUS
  Administered 2020-06-23: 3 [IU] via SUBCUTANEOUS
  Administered 2020-06-23: 2 [IU] via SUBCUTANEOUS
  Administered 2020-06-23 – 2020-06-29 (×8): 3 [IU] via SUBCUTANEOUS
  Administered 2020-06-29: 8 [IU] via SUBCUTANEOUS
  Administered 2020-06-30: 5 [IU] via SUBCUTANEOUS
  Administered 2020-06-30: 8 [IU] via SUBCUTANEOUS
  Administered 2020-06-30: 5 [IU] via SUBCUTANEOUS
  Administered 2020-07-01: 2 [IU] via SUBCUTANEOUS
  Administered 2020-07-01 – 2020-07-02 (×2): 3 [IU] via SUBCUTANEOUS
  Administered 2020-07-02: 5 [IU] via SUBCUTANEOUS
  Administered 2020-07-03 – 2020-07-04 (×5): 3 [IU] via SUBCUTANEOUS
  Administered 2020-07-05 (×2): 2 [IU] via SUBCUTANEOUS
  Administered 2020-07-05 – 2020-07-06 (×4): 5 [IU] via SUBCUTANEOUS
  Administered 2020-07-07: 3 [IU] via SUBCUTANEOUS

## 2020-06-12 MED ORDER — VANCOMYCIN VARIABLE DOSE PER UNSTABLE RENAL FUNCTION (PHARMACIST DOSING): Status: DC

## 2020-06-12 MED ORDER — TRAMADOL HCL 50 MG PO TABS
25.0000 mg | ORAL_TABLET | Freq: Two times a day (BID) | ORAL | Status: DC | PRN
  Administered 2020-06-12 – 2020-06-22 (×11): 25 mg via ORAL
  Filled 2020-06-12 (×11): qty 1

## 2020-06-12 NOTE — Progress Notes (Signed)
Inpatient Diabetes Program Recommendations  AACE/ADA: New Consensus Statement on Inpatient Glycemic Control   Target Ranges:  Prepandial:   less than 140 mg/dL      Peak postprandial:   less than 180 mg/dL (1-2 hours)      Critically ill patients:  140 - 180 mg/dL   Results for Lucas, Doverspike [name] (MRN [id number]) as of 06/12/2020 12:32  Ref. Range 06/11/2020 06:44 06/11/2020 12:34 06/11/2020 16:46 06/11/2020 20:19 06/12/2020 06:51 06/12/2020 06:53 06/12/2020 11:16  Glucose-Capillary Latest Ref Range: 70 - 99 mg/dL 105 (H) 105 (H) 209 (H) 209 (H) 308 (H) 293 (H) 262 (H)   Review of Glycemic Control  Diabetes history: DM2 Outpatient Diabetes medications: Lantus 25 units daily, Humalog 2-12 unitsi TID with meals Current orders for Inpatient glycemic control: Lantus 20 units daily, Novolog 0-15 units TID with meals  Inpatient Diabetes Program Recommendations:   Correction (SSI): Please consider adding Novolog 0-5 units QHS for bedtime correction.  Insulin - Meal Coverage: Please consider ordering Novolog 4 units TID with meals for meal coverage if patient eats at least 50% of meals.  Thanks, Barnie Alderman, RN, MSN, CDE Diabetes Coordinator Inpatient Diabetes Program [phone] (Team Pager from 8am to 5pm)

## 2020-06-12 NOTE — Progress Notes (Signed)
Patient refused cardiac monitoring at this time, education given, but patient still refused. Vanessa Gridley, MD notified. Will continue to monitor.   Daud Cayer,RN.

## 2020-06-12 NOTE — Progress Notes (Signed)
Renal Navigator received message from CM that per MD consult, patient needs OP HD arranged. Per Nephrology consult, patient dialyzes at [facility] in [place] and has already been advised to request a transfer from his home unit if needed.  Navigator met with patient who states "it was a hassle" when asked about whether or not he has initiated a transfer from his home unit. Patient states he has moved to [facility] at this time, but doesn't not know the address. Navigator stated the importance of providing an address so that Navigator can follow up on transfer request to the closest [facility]) clinic in [place], as there are two. Patient states he is aware that there is one on [address] and [place]" [place], [place] Patient reports that he lives closer to the one on [address] Navigator still in need of address to provide to new clinic. Patient reports that he uses Medicaid Transportation for OP HD appointments.  Navigator left message for [facility] Social Worker to follow up on transfer from [facility] to see if [facility] can [facility] accepted at [facility] since he reports he is now living closest to this center. Navigator will continue to follow.  Alphonzo Cruise, [facility] Navigator [phone]

## 2020-06-12 NOTE — TOC Initial Note (Signed)
Transition of Care [facility] (Lucas [name])) - Initial/Assessment Note    Patient Details  Name: Lucas [name] MRN: [id number] Date of Birth: 1971-04-07  Transition of Care Lucas [name]) CM/SW Contact:    Lucas Favre, RN Phone Number: 06/12/2020, 11:49 AM  Clinical Narrative:                 Spoke to patient at bedside. Patient plans to discharge to his friend's Lucas [name] ) home in [place]. However, he does not know the address. Explained Lucas [name] the Renal Navigator will need this information. See her note.   Confirmed with patient he has Medicaid and his co pays for all his medications is $3 per prescription. Explained NCM cannot assist with co pays.   Patient has a wheel chair already.   Patient states he will have the address he is discharging to later this afternoon.  Expected Discharge Plan: Home/Self Care Barriers to Discharge: Continued Medical Work up   Patient Goals and CMS Choice Patient states their goals for this hospitalization and ongoing recovery are:: to discharge CMS Medicare.gov Compare Post Acute Care list provided to:: Patient Choice offered to / list presented to : NA  Expected Discharge Plan and Services Expected Discharge Plan: Home/Self Care   Discharge Planning Services: CM Consult   Living arrangements for the past 2 months: Single Family Home                 DME Arranged: N/A         HH Arranged: NA          Prior Living Arrangements/Services Living arrangements for the past 2 months: Single Family Home   Patient language and need for interpreter reviewed:: Yes Do you feel safe going back to the place where you live?: Yes          Current home services: DME Criminal Activity/Legal Involvement Pertinent to Current Situation/Hospitalization: No - Comment as needed  Activities of Daily Living Home Assistive Devices/Equipment: Other (Comment), Wheelchair (knee pads) ADL Screening (condition at time of admission) Patient's cognitive ability adequate to  safely complete daily activities?: Yes Is the patient deaf or have difficulty hearing?: No Does the patient have difficulty seeing, even when wearing glasses/contacts?: No Does the patient have difficulty concentrating, remembering, or making decisions?: No Patient able to express need for assistance with ADLs?: Yes Does the patient have difficulty dressing or bathing?: No Independently performs ADLs?: Yes (appropriate for developmental age) Does the patient have difficulty walking or climbing stairs?: Yes Weakness of Legs: None Weakness of Arms/Hands: Right  Permission Sought/Granted   Permission granted to share information with : No              Emotional Assessment Appearance:: Appears stated age Attitude/Demeanor/Rapport: Engaged Affect (typically observed): Accepting Orientation: : Oriented to Self, Oriented to Place, Oriented to  Time, Oriented to Situation Alcohol / Substance Use: Not Applicable Psych Involvement: No (comment)  Admission diagnosis:  Atypical chest pain [R07.89] ESRD on hemodialysis ([place]) [N18.6, Z99.2] Anemia associated with stage 5 chronic renal failure (HCC) [N18.5, D63.1] Hyperglycemia due to diabetes mellitus ([place]) [E11.65] Patient Active Problem List   Diagnosis Date Noted  . 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 Bacteremia due to Enterococcus 06/10/2020  . Anemia associated with stage 5 chronic renal failure ([place])   . ESRD on hemodialysis ([place])   . Hyperglycemia due to diabetes mellitus ([place]) 06/09/2020   PCP:  System, Pcp Not In Pharmacy:   [place] -  [place], [place] - [address] [place] [address] Phone: [phone] Fax: [place], [place] - [address] [place] [address] Phone: [phone] Fax: [phone]  [facility] [place], [place] [address] [place] [address] Phone: [phone] Fax: [phone]     Social Determinants of Health (SDOH) Interventions    Readmission Risk Interventions No flowsheet data found.

## 2020-06-12 NOTE — Progress Notes (Signed)
Renal Navigator received call back from [place] clinic Social Worker/[name] who states she has contacted [name] and was unaware until today that they are no longer willing to transport patient from [facility] to [facility] as his transportation is through [facility].  Patient is in need of either a new OP HD clinic in [facility] and it appears unlikely that he will be accepted due to hx of non-compliance, or he will need to find new transportation from [employer] to [facility] so he can continue dialyzing at his OP HD clinic/[facility]. Renal Navigator and Leisure centre manager evaluating options. Renal Navigator will continue to follow.  Alphonzo Cruise, [place] Renal Navigator [phone]

## 2020-06-12 NOTE — H&P (View-Only) (Signed)
Patient ID: Lucas [name], male   DOB: [birth year], 49 y.o.   MRN: [id number]         [facility] for Infectious Disease  Date of Admission:  06/09/2020           Day 3 vancomycin        Day 2 gentamicin ASSESSMENT: He has enterococcal bacteremia complicated by mitral valve endocarditis.  Repeat blood cultures are negative at 48 hours.  Unfortunately his enterococcal isolate has high-level gentamicin resistance.  I will continue vancomycin for now and see if the lab can obtain susceptibility testing against streptomycin.  We may need to switch him to ampicillin plus ceftriaxone which would require daily dosing and a new central line.  PLAN: 1. We will continue vancomycin  2. Discontinue gentamicin 3. Check with lab to see if daptomycin susceptibilities are possible  Principal Problem:   Bacteremia due to Enterococcus Active Problems:   Endocarditis of mitral valve   Hyperglycemia due to diabetes mellitus (HCC)   Anemia associated with stage 5 chronic renal failure (HCC)   ESRD on hemodialysis (HCC)   Scheduled Meds: . amiodarone  200 mg Oral Daily  . amLODipine  10 mg Oral Daily  . ARIPiprazole  5 mg Oral Daily  . atorvastatin  40 mg Oral Daily  . carvedilol  12.5 mg Oral BID  . Chlorhexidine Gluconate Cloth  6 each Topical Q0600  . Chlorhexidine Gluconate Cloth  6 each Topical Q0600  . [START ON 06/13/2020] darbepoetin (ARANESP) injection - DIALYSIS  150 mcg Intravenous Q Thu-HD  . doxercalciferol  4 mcg Oral Q T,Th,Sa-HD  . famotidine  20 mg Oral QHS  . heparin injection (subcutaneous)  5,000 Units Subcutaneous Q8H  . hydrALAZINE  100 mg Oral TID  . insulin aspart  0-15 Units Subcutaneous TID WC  . insulin glargine  20 Units Subcutaneous Daily  . mupirocin ointment  1 application Nasal BID  . pantoprazole  40 mg Oral Daily  . pregabalin  25 mg Oral QHS  . sevelamer carbonate  1,600 mg Oral TID with meals  . tamsulosin  0.4 mg Oral Daily   Continuous Infusions: .  [START ON 06/13/2020] gentamicin     PRN Meds:.acetaminophen, lidocaine   SUBJECTIVE: His hemodialysis catheter has been removed.  He is still having pain around the exit site.  Review of Systems: Review of Systems  Constitutional: Negative for chills, diaphoresis and fever.  HENT: Positive for tinnitus. Negative for hearing loss.   Respiratory: Negative for cough and shortness of breath.   Cardiovascular: Negative for chest pain.  Gastrointestinal: Negative for abdominal pain, diarrhea, nausea and vomiting.    No Known Allergies  OBJECTIVE: Vitals:   06/11/20 1649 06/11/20 2017 06/12/20 0448 06/12/20 0818  BP: 134/82 (!) 142/74 (!) 144/96 (!) 133/95  Pulse: 94 95 89 88  Resp: 20 20 18 18   Temp: 99.7 F (37.6 C) 97.8 F (36.6 C) 98.3 F (36.8 C) 98.6 F (37 C)  TempSrc:    Oral  SpO2: 96% 100% 100% 100%  Weight:      Height:       Body mass index is 34.42 kg/m.  Physical Exam Cardiovascular:     Rate and Rhythm: Normal rate and regular rhythm.     Heart sounds: No murmur heard.   Pulmonary:     Effort: Pulmonary effort is normal.     Breath sounds: Normal breath sounds.  Psychiatric:        Mood and Affect:  Mood normal.     Lab Results Lab Results  Component Value Date   WBC 16.1 (H) 06/12/2020   HGB 7.7 (L) 06/12/2020   HCT 25.3 (L) 06/12/2020   MCV 93.0 06/12/2020   PLT 240 06/12/2020    Lab Results  Component Value Date   CREATININE 6.38 (H) 06/12/2020   BUN 32 (H) 06/12/2020   NA 130 (L) 06/12/2020   K 3.6 06/12/2020   CL 97 (L) 06/12/2020   CO2 22 06/12/2020    Lab Results  Component Value Date   ALT 22 05/22/2020   AST 27 05/22/2020   ALKPHOS 243 (H) 05/22/2020   BILITOT 0.5 05/22/2020     Microbiology: Recent Results (from the past 240 hour(s))  SARS Coronavirus 2 by RT PCR (hospital order, performed in [place] hospital lab) Nasopharyngeal Nasopharyngeal Swab     Status: None   Collection Time: 06/09/20  3:11 PM   Specimen:  Nasopharyngeal Swab  Result Value Ref Range Status   SARS Coronavirus 2 NEGATIVE NEGATIVE Final    Comment: (NOTE) SARS-CoV-2 target nucleic acids are NOT DETECTED.  The SARS-CoV-2 RNA is generally detectable in upper and lower respiratory specimens during the acute phase of infection. The lowest concentration of SARS-CoV-2 viral copies this assay can detect is 250 copies / mL. A negative result does not preclude SARS-CoV-2 infection and should not be used as the sole basis for treatment or other patient management decisions.  A negative result may occur with improper specimen collection / handling, submission of specimen other than nasopharyngeal swab, presence of viral mutation(s) within the areas targeted by this assay, and inadequate number of viral copies (<250 copies / mL). A negative result must be combined with clinical observations, patient history, and epidemiological information.  Fact Sheet for Patients:   StrictlyIdeas.no  Fact Sheet for Healthcare Providers: BankingDealers.co.za  This test is not yet approved or  cleared by the [place] FDA and has been authorized for detection and/or diagnosis of SARS-CoV-2 by FDA under an Emergency Use Authorization (EUA).  This EUA will remain in effect (meaning this test can be used) for the duration of the COVID-19 declaration under Section 564(b)(1) of the Act, 21 U.S.C. section 360bbb-3(b)(1), unless the authorization is terminated or revoked sooner.  Performed at [facility], [place] [address] [place], [place] [address]   Culture, blood (single)     Status: Abnormal   Collection Time: 06/09/20  9:22 PM   Specimen: BLOOD RIGHT HAND  Result Value Ref Range Status   Specimen Description BLOOD RIGHT HAND  Final   Special Requests   Final    BOTTLES DRAWN AEROBIC AND ANAEROBIC Blood Culture adequate volume   Culture  Setup Time   Final    GRAM POSITIVE COCCI IN  CLUSTERS AEROBIC BOTTLE ONLY IN BOTH AEROBIC AND ANAEROBIC BOTTLES CRITICAL RESULT CALLED TO, READ BACK BY AND VERIFIED WITH: [place] C [phone] FCP Performed at [facility], [place] [address], [place], [place] [address]    Culture ENTEROCOCCUS FAECALIS (A)  Final   Report Status 06/12/2020 FINAL  Final   Organism ID, Bacteria ENTEROCOCCUS FAECALIS  Final      Susceptibility   Enterococcus faecalis - MIC*    AMPICILLIN <=2 SENSITIVE Sensitive     VANCOMYCIN 1 SENSITIVE Sensitive     GENTAMICIN SYNERGY RESISTANT Resistant     * ENTEROCOCCUS FAECALIS  Blood Culture ID Panel (Reflexed)     Status: Abnormal  Collection Time: 06/09/20  9:22 PM  Result Value Ref Range Status   Enterococcus species DETECTED (A) NOT DETECTED Final    Comment: CRITICAL RESULT CALLED TO, READ BACK BY AND VERIFIED WITH: PHARMD EMILY S [phone] FCP    Vancomycin resistance NOT DETECTED NOT DETECTED Final   Listeria monocytogenes NOT DETECTED NOT DETECTED Final   Staphylococcus species NOT DETECTED NOT DETECTED Final   Staphylococcus aureus (BCID) NOT DETECTED NOT DETECTED Final   Streptococcus species NOT DETECTED NOT DETECTED Final   Streptococcus agalactiae NOT DETECTED NOT DETECTED Final   Streptococcus pneumoniae NOT DETECTED NOT DETECTED Final   Streptococcus pyogenes NOT DETECTED NOT DETECTED Final   Acinetobacter baumannii NOT DETECTED NOT DETECTED Final   Enterobacteriaceae species NOT DETECTED NOT DETECTED Final   Enterobacter cloacae complex NOT DETECTED NOT DETECTED Final   Escherichia coli NOT DETECTED NOT DETECTED Final   Klebsiella oxytoca NOT DETECTED NOT DETECTED Final   Klebsiella pneumoniae NOT DETECTED NOT DETECTED Final   Proteus species NOT DETECTED NOT DETECTED Final   Serratia marcescens NOT DETECTED NOT DETECTED Final   Haemophilus influenzae NOT DETECTED NOT DETECTED Final   Neisseria meningitidis NOT DETECTED NOT DETECTED Final   Pseudomonas aeruginosa NOT  DETECTED NOT DETECTED Final   Candida albicans NOT DETECTED NOT DETECTED Final   Candida glabrata NOT DETECTED NOT DETECTED Final   Candida krusei NOT DETECTED NOT DETECTED Final   Candida parapsilosis NOT DETECTED NOT DETECTED Final   Candida tropicalis NOT DETECTED NOT DETECTED Final    Comment: Performed at [facility], [address], [place], [place] [address]  MRSA PCR Screening     Status: Abnormal   Collection Time: 06/09/20 11:29 PM   Specimen: Nasal Mucosa; Nasopharyngeal  Result Value Ref Range Status   MRSA by PCR POSITIVE (A) NEGATIVE Final    Comment:        The GeneXpert MRSA Assay (FDA approved for NASAL specimens only), is one component of a comprehensive MRSA colonization surveillance program. It is not intended to diagnose MRSA infection nor to guide or monitor treatment for MRSA infections. RESULT CALLED TO, READ BACK BY AND VERIFIED WITH: ISAACS,T RN 0104 06/10/2020 MITCHELL,L Performed at [facility], [place] [address], [place], [place] [address]   Culture, blood (single)     Status: None (Preliminary result)   Collection Time: 06/10/20  1:25 PM   Specimen: BLOOD  Result Value Ref Range Status   Specimen Description BLOOD HEMODIALYSIS CATHETER  Final   Special Requests   Final    BOTTLES DRAWN AEROBIC AND ANAEROBIC Blood Culture results may not be optimal due to an excessive volume of blood received in culture bottles   Culture  Setup Time   Final    IN BOTH AEROBIC AND ANAEROBIC BOTTLES GRAM POSITIVE COCCI CRITICAL VALUE NOTED.  VALUE IS CONSISTENT WITH PREVIOUSLY REPORTED AND CALLED VALUE.    Culture   Final    CULTURE REINCUBATED FOR BETTER GROWTH IDENTIFICATION TO FOLLOW Performed at [facility], [place] [address], [place],  [address]    Report Status PENDING  Incomplete    Michel Bickers, MD [facility] for Infectious [place] [facility] [phone] pager   [phone] cell 06/12/2020, 10:38 AM

## 2020-06-12 NOTE — Progress Notes (Addendum)
Patient ID: Lucas [name], male   DOB: [birth year], 49 y.o.   MRN: [id number]         [facility] for Infectious Disease  Date of Admission:  06/09/2020           Day 3 vancomycin        Day 2 gentamicin ASSESSMENT: He has enterococcal bacteremia complicated by mitral valve endocarditis.  Repeat blood cultures are negative at 48 hours.  Unfortunately his enterococcal isolate has high-level gentamicin resistance.  I will continue vancomycin for now and see if the lab can obtain susceptibility testing against streptomycin.  We may need to switch him to ampicillin plus ceftriaxone which would require daily dosing and a new central line.  PLAN: 1. We will continue vancomycin  2. Discontinue gentamicin 3. Check with lab to see if daptomycin susceptibilities are possible  Principal Problem:   Bacteremia due to Enterococcus Active Problems:   Endocarditis of mitral valve   Hyperglycemia due to diabetes mellitus (HCC)   Anemia associated with stage 5 chronic renal failure (HCC)   ESRD on hemodialysis (HCC)   Scheduled Meds: . amiodarone  200 mg Oral Daily  . amLODipine  10 mg Oral Daily  . ARIPiprazole  5 mg Oral Daily  . atorvastatin  40 mg Oral Daily  . carvedilol  12.5 mg Oral BID  . Chlorhexidine Gluconate Cloth  6 each Topical Q0600  . Chlorhexidine Gluconate Cloth  6 each Topical Q0600  . [START ON 06/13/2020] darbepoetin (ARANESP) injection - DIALYSIS  150 mcg Intravenous Q Thu-HD  . doxercalciferol  4 mcg Oral Q T,Th,Sa-HD  . famotidine  20 mg Oral QHS  . heparin injection (subcutaneous)  5,000 Units Subcutaneous Q8H  . hydrALAZINE  100 mg Oral TID  . insulin aspart  0-15 Units Subcutaneous TID WC  . insulin glargine  20 Units Subcutaneous Daily  . mupirocin ointment  1 application Nasal BID  . pantoprazole  40 mg Oral Daily  . pregabalin  25 mg Oral QHS  . sevelamer carbonate  1,600 mg Oral TID with meals  . tamsulosin  0.4 mg Oral Daily   Continuous Infusions: .  [START ON 06/13/2020] gentamicin     PRN Meds:.acetaminophen, lidocaine   SUBJECTIVE: His hemodialysis catheter has been removed.  He is still having pain around the exit site.  Review of Systems: Review of Systems  Constitutional: Negative for chills, diaphoresis and fever.  HENT: Positive for tinnitus. Negative for hearing loss.   Respiratory: Negative for cough and shortness of breath.   Cardiovascular: Negative for chest pain.  Gastrointestinal: Negative for abdominal pain, diarrhea, nausea and vomiting.    No Known Allergies  OBJECTIVE: Vitals:   06/11/20 1649 06/11/20 2017 06/12/20 0448 06/12/20 0818  BP: 134/82 (!) 142/74 (!) 144/96 (!) 133/95  Pulse: 94 95 89 88  Resp: 20 20 18 18   Temp: 99.7 F (37.6 C) 97.8 F (36.6 C) 98.3 F (36.8 C) 98.6 F (37 C)  TempSrc:    Oral  SpO2: 96% 100% 100% 100%  Weight:      Height:       Body mass index is 34.42 kg/m.  Physical Exam Cardiovascular:     Rate and Rhythm: Normal rate and regular rhythm.     Heart sounds: No murmur heard.   Pulmonary:     Effort: Pulmonary effort is normal.     Breath sounds: Normal breath sounds.  Psychiatric:        Mood and Affect:  Mood normal.     Lab Results Lab Results  Component Value Date   WBC 16.1 (H) 06/12/2020   HGB 7.7 (L) 06/12/2020   HCT 25.3 (L) 06/12/2020   MCV 93.0 06/12/2020   PLT 240 06/12/2020    Lab Results  Component Value Date   CREATININE 6.38 (H) 06/12/2020   BUN 32 (H) 06/12/2020   NA 130 (L) 06/12/2020   K 3.6 06/12/2020   CL 97 (L) 06/12/2020   CO2 22 06/12/2020    Lab Results  Component Value Date   ALT 22 05/22/2020   AST 27 05/22/2020   ALKPHOS 243 (H) 05/22/2020   BILITOT 0.5 05/22/2020     Microbiology: Recent Results (from the past 240 hour(s))  SARS Coronavirus 2 by RT PCR (hospital order, performed in [place] hospital lab) Nasopharyngeal Nasopharyngeal Swab     Status: None   Collection Time: 06/09/20  3:11 PM   Specimen:  Nasopharyngeal Swab  Result Value Ref Range Status   SARS Coronavirus 2 NEGATIVE NEGATIVE Final    Comment: (NOTE) SARS-CoV-2 target nucleic acids are NOT DETECTED.  The SARS-CoV-2 RNA is generally detectable in upper and lower respiratory specimens during the acute phase of infection. The lowest concentration of SARS-CoV-2 viral copies this assay can detect is 250 copies / mL. A negative result does not preclude SARS-CoV-2 infection and should not be used as the sole basis for treatment or other patient management decisions.  A negative result may occur with improper specimen collection / handling, submission of specimen other than nasopharyngeal swab, presence of viral mutation(s) within the areas targeted by this assay, and inadequate number of viral copies (<250 copies / mL). A negative result must be combined with clinical observations, patient history, and epidemiological information.  Fact Sheet for Patients:   StrictlyIdeas.no  Fact Sheet for Healthcare Providers: BankingDealers.co.za  This test is not yet approved or  cleared by the [place] FDA and has been authorized for detection and/or diagnosis of SARS-CoV-2 by FDA under an Emergency Use Authorization (EUA).  This EUA will remain in effect (meaning this test can be used) for the duration of the COVID-19 declaration under Section 564(b)(1) of the Act, 21 U.S.C. section 360bbb-3(b)(1), unless the authorization is terminated or revoked sooner.  Performed at [facility], [place] [address] [place], [place] [address]   Culture, blood (single)     Status: Abnormal   Collection Time: 06/09/20  9:22 PM   Specimen: BLOOD RIGHT HAND  Result Value Ref Range Status   Specimen Description BLOOD RIGHT HAND  Final   Special Requests   Final    BOTTLES DRAWN AEROBIC AND ANAEROBIC Blood Culture adequate volume   Culture  Setup Time   Final    GRAM POSITIVE COCCI IN  CLUSTERS AEROBIC BOTTLE ONLY IN BOTH AEROBIC AND ANAEROBIC BOTTLES CRITICAL RESULT CALLED TO, READ BACK BY AND VERIFIED WITH: [place] N [phone] [id number] FCP Performed at [facility], [place] [address] [place], [place] [address]    Culture ENTEROCOCCUS FAECALIS (A)  Final   Report Status 06/12/2020 FINAL  Final   Organism ID, Bacteria ENTEROCOCCUS FAECALIS  Final      Susceptibility   Enterococcus faecalis - MIC*    AMPICILLIN <=2 SENSITIVE Sensitive     VANCOMYCIN 1 SENSITIVE Sensitive     GENTAMICIN SYNERGY RESISTANT Resistant     * ENTEROCOCCUS FAECALIS  Blood Culture ID Panel (Reflexed)     Status: Abnormal  Collection Time: 06/09/20  9:22 PM  Result Value Ref Range Status   Enterococcus species DETECTED (A) NOT DETECTED Final    Comment: CRITICAL RESULT CALLED TO, READ BACK BY AND VERIFIED WITH: PHARMD EMILY S [id number] FCP    Vancomycin resistance NOT DETECTED NOT DETECTED Final   Listeria monocytogenes NOT DETECTED NOT DETECTED Final   Staphylococcus species NOT DETECTED NOT DETECTED Final   Staphylococcus aureus (BCID) NOT DETECTED NOT DETECTED Final   Streptococcus species NOT DETECTED NOT DETECTED Final   Streptococcus agalactiae NOT DETECTED NOT DETECTED Final   Streptococcus pneumoniae NOT DETECTED NOT DETECTED Final   Streptococcus pyogenes NOT DETECTED NOT DETECTED Final   Acinetobacter baumannii NOT DETECTED NOT DETECTED Final   Enterobacteriaceae species NOT DETECTED NOT DETECTED Final   Enterobacter cloacae complex NOT DETECTED NOT DETECTED Final   Escherichia coli NOT DETECTED NOT DETECTED Final   Klebsiella oxytoca NOT DETECTED NOT DETECTED Final   Klebsiella pneumoniae NOT DETECTED NOT DETECTED Final   Proteus species NOT DETECTED NOT DETECTED Final   Serratia marcescens NOT DETECTED NOT DETECTED Final   Haemophilus influenzae NOT DETECTED NOT DETECTED Final   Neisseria meningitidis NOT DETECTED NOT DETECTED Final   Pseudomonas aeruginosa NOT  DETECTED NOT DETECTED Final   Candida albicans NOT DETECTED NOT DETECTED Final   Candida glabrata NOT DETECTED NOT DETECTED Final   Candida krusei NOT DETECTED NOT DETECTED Final   Candida parapsilosis NOT DETECTED NOT DETECTED Final   Candida tropicalis NOT DETECTED NOT DETECTED Final    Comment: Performed at [facility], [address], [place], [place] [address]  MRSA PCR Screening     Status: Abnormal   Collection Time: 06/09/20 11:29 PM   Specimen: Nasal Mucosa; Nasopharyngeal  Result Value Ref Range Status   MRSA by PCR POSITIVE (A) NEGATIVE Final    Comment:        The GeneXpert MRSA Assay (FDA approved for NASAL specimens only), is one component of a comprehensive MRSA colonization surveillance program. It is not intended to diagnose MRSA infection nor to guide or monitor treatment for MRSA infections. RESULT CALLED TO, READ BACK BY AND VERIFIED WITH: ISAACS,T RN 0104 06/10/2020 MITCHELL,L Performed at [facility], [place] [address] [place], [place] [address]   Culture, blood (single)     Status: None (Preliminary result)   Collection Time: 06/10/20  1:25 PM   Specimen: BLOOD  Result Value Ref Range Status   Specimen Description BLOOD HEMODIALYSIS CATHETER  Final   Special Requests   Final    BOTTLES DRAWN AEROBIC AND ANAEROBIC Blood Culture results may not be optimal due to an excessive volume of blood received in culture bottles   Culture  Setup Time   Final    IN BOTH AEROBIC AND ANAEROBIC BOTTLES GRAM POSITIVE COCCI CRITICAL VALUE NOTED.  VALUE IS CONSISTENT WITH PREVIOUSLY REPORTED AND CALLED VALUE.    Culture   Final    CULTURE REINCUBATED FOR BETTER GROWTH IDENTIFICATION TO FOLLOW Performed at [facility], [place] [address] [place], [place] [address]    Report Status PENDING  Incomplete    Michel Bickers, MD [facility] for Infectious [place] [facility] [phone] pager   [phone] cell 06/12/2020, 10:38 AM

## 2020-06-12 NOTE — Progress Notes (Signed)
Patient refuses his cardiac monitor. He states that it is in the way and that it itches, it causes him discomfort. Patient was informed of the importance of having the monitor on.

## 2020-06-12 NOTE — Progress Notes (Signed)
    [facility] has been requested to perform a transesophageal echocardiogram on Lucas [name] for Bacteremia.  After careful review of history and examination, the risks and benefits of transesophageal echocardiogram have been explained including risks of esophageal damage, perforation (1:10,000 risk), bleeding, pharyngeal hematoma as well as other potential complications associated with conscious sedation including aspiration, arrhythmia, respiratory failure and death. Alternatives to treatment were discussed, questions were answered. Patient is willing to proceed.  TEE - Dr. Sallyanne Kuster @ 8am 06/13/20. NPO after midnight. Meds with sips.   Leanor Kail, PA-C 06/12/2020 1:26 PM

## 2020-06-12 NOTE — Progress Notes (Signed)
[facility] Progress Note  Name: Ramiro Pangilinan MRN: [id number] DOB: 1971/01/24   Subjective:  Had HD on 6/29 with 3.8 kg UF.  TTE noted a mitral valve vegetation.  He states today (for the first time) that he has a court appearance for today and that he wants to leave.  Encouraged him to stay as has blood stream infection which now involves his heart.  Team is going to discuss with case management.     Review of systems: denies nausea denies shortness of breath.  discomfort around catheter site better     ------- Background on consult:  Melissa Pulido is an 49 y.o. male DM HTN CHF GERD Polysubstance abuse HLD aflutter s/ ablation (on amiodarone and apixaban) ESRD TTS in [facility] presenting with pain starting past 24hrs in the right side of chest over port radiating to the neck and worse with palpation or movement. No associated with exertion but he states he's had intermittent fevers/ chills. Denies n/v/ abdominal pain. He also denies headaches, syncopal episodes or sick contacts. He is homeless and recently lost his housing now going to move in with friend [name] in [place]. His last dialysis treatment was on Thursday in [place]. He has recently had drainage of a buttocks abscess x2 and is supposed to be on doxycycline for that wound.    Intake/Output Summary (Last 24 hours) at 06/12/2020 0734 Last data filed at 06/12/2020 0448 Gross per 24 hour  Intake 680 ml  Output 3750 ml  Net -3070 ml    Vitals:  Vitals:   06/11/20 1237 06/11/20 1649 06/11/20 2017 06/12/20 0448  BP: (!) 141/89 134/82 (!) 142/74 (!) 144/96  Pulse: 95 94 95 89  Resp: 16 20 20 18   Temp: 98.9 F (37.2 C) 99.7 F (37.6 C) 97.8 F (36.6 C) 98.3 F (36.8 C)  TempSrc: Oral     SpO2: 98% 96% 100% 100%  Weight:      Height:         Physical Exam: General adult male in bed in no acute distress HEENT normocephalic atraumatic extraocular movements intact sclera anicteric Neck supple trachea  midline Lungs clear to auscultation bilaterally normal work of breathing at rest  Heart S1S2 no rub Abdomen soft nontender nondistended Extremities 1+ edema resid limbs bilateral lower extremities Psych normal mood and affect Neuro - alert and oriented x3 conversant and follows commands Access RIJ tunneled catheter has been removed    Medications reviewed   Labs:  BMP Latest Ref Rng & Units 06/11/2020 06/10/2020 06/10/2020  Glucose 70 - 99 mg/dL 111(H) 210(H) 227(H)  BUN 6 - 20 mg/dL 41(H) 57(H) 56(H)  Creatinine 0.61 - 1.24 mg/dL 8.22(H) 11.15(H) 10.81(H)  Sodium 135 - 145 mmol/L 133(L) 131(L) 131(L)  Potassium 3.5 - 5.1 mmol/L 3.5 4.0 4.1  Chloride 98 - 111 mmol/L 99 98 98  CO2 22 - 32 mmol/L 22 19(L) 18(L)  Calcium 8.9 - 10.3 mg/dL 8.1(L) 8.2(L) 8.3(L)    Outpatient HD orders:   [facility]) Phone [phone] Scheduled TTS 4 hours His outpatient unit reports noncompliance BF 400; DF 800 F250 dialyzer  2/2.5 ca bath EDW 250 lbs Last weight 272.2 lbs on 6/24 (signed off early and had skipped the treatment prior) aranesp 120 mcg weekly  hectoral 4 mcg each tx    Assessment/Plan:   1. Enterococcus bacteremia  - Will plan for HD on 6/29 first shift and then IR to remove tunneled dialysis catheter  for a line holiday.  Will then assess dialysis needs daily and plan for replacement of tunneled catheter at new site vs. nontunneled catheter followed by a tunneled catheter per clinical course.  - ID was consulted - appreciate ID and primary team  - on vanc, gent per primary team and ID - would hold eliquis 5 mg BID for now given need for replacing dialysis catheter   2. Mitral valve endocarditis  - mitral valve vegetation noted on TTE on 6/29  3 ESRD: - Last last HD on 6/29 per TTS schedule.  Tunneled catheter was removed for line holiday for bacteremia  - Will discuss timing and type of dialysis catheter to be replaced with ID   - He has  been informed that he has to go to dialysis at his home unit and if he wishes to change to a different unit they will need to get him set up  4. Anemia of ESRD:  For ESA every Thursday - increased aranesp to 150 mcg every thursday. Normally aranesp as outpatient as above  5. Metabolic Bone Disease:On sevelamer 1600 mg 3 times daily at home - continue. Continue hectorol   6. Hypertension:  - continue on current regimen   7DM - per primary team   8. buttock abscess: Status post drainage in the ER x2 previously; is supposed to be on doxycycline per charting.  Per primary team. Needs to follow-up with wound carein [place].   Disposition - continue inpatient monitoring - team is contacting case management to discuss his reported court appearance for today  Claudia Desanctis, MD 06/12/2020 7:53 AM

## 2020-06-12 NOTE — Progress Notes (Addendum)
Renal Navigator spoke with [facility] Social Worker who states patient is known to their clinic and will not be accepted for treatment due to extensive hx of non-compliance. Although [facility] is within the same group of HD clinics, their MD group is different from [facility] and there is not an MD at [facility] who will accept patient. The MD group at [facility] is the same at [facility] and [facility].  Renal Navigator contacted [facility] to request to speak with Education officer, museum. She is not in the office at this time, but message was left requesting a return call.  Lucas [name], [place] Renal Navigator [phone]

## 2020-06-12 NOTE — Progress Notes (Signed)
VAST consulted to obtain IV access for upcoming daily antibiotics. Attempted to place IV in pt's lower left arm, unsuccessfully. Pt requested IV be placed in his upper arm "in the muscle". Despite education with patient regarding IV placement, he demanded IV be placed in upper arm. 20g angiocath placed in anterior upper arm.

## 2020-06-12 NOTE — Plan of Care (Signed)
  Problem: Health Behavior/Discharge Planning: Goal: Ability to manage health-related needs will improve 06/12/2020 1130 by Arlyss Repress, RN Outcome: Progressing 06/12/2020 1129 by Arlyss Repress, RN Outcome: Progressing

## 2020-06-12 NOTE — Progress Notes (Signed)
Family Medicine Teaching Service Daily Progress Note Intern Pager: [phone]  Patient name: Lucas [name] Medical record number: [id number] Date of birth: Dec 17, 1970 Age: 49 y.o. Gender: male  Primary Care Provider: System, Pcp Not In Consultants: Nephrology, infectious disease Code Status: DNR  Pt Overview and Major Events to Date:  6/27-patient admitted for hyperglycemia and port pain 6/28-blood cultures grew Enterococcus  Assessment and Plan: Lucas [name] is a 49 y.o. male presenting with hyperglycemia and pain in his chest surrounding the right subclavian port which radiates into his neck. PMH is significant for atrial flutter s/p ablation, T2DM, ESRD, hyperlipidemia, anemia, CHF, GERD, polysubstance abuse, homelessness.  Right-sided chest pain around the port Patient initially presented to the emergency department because he was having pain around his port site.  He has his port changed every Tuesday and reports that he has never had any issues with it but that he was told to watch out for signs of infection and he is concerned it is getting infected.  There is no erythema or edema around the port site.  Patient has remained afebrile over the last 24 hours.  His port was removed on his right side yesterday afternoon and blood cultures were collected.  TTE showed vegetations on his mitral valve.  He is planned for a TEE EEG on 7/1. Plan for port holiday, patient will receive dialysis tomorrow morning and then will be on holiday until Friday 7/2. -Nephrology consulted, appreciate recommendations -Infectious disease consulted appreciate recommendations -Continue vancomycin per infectious disease -TTE to rule out endocarditis 7/1 -N.p.o. at midnight -Planning on imaging of right lower extremity to rule out osteomyelitis -Line holiday per nephrology pending dialysis tomorrow morning -Follow-up on repeat blood cultures from after his line was pulled -Follow-up on blood cultures for speciation of  Enterococcus -Tramadol twice daily as needed for pain  Type 2 diabetes Patient presented with blood glucoses in the high 600s.  He was initially started on insulin drip and on insulin drip was discontinued and he was transitioned to subcutaneous insulin at his home dose of 20 units with moderate sensitivity sliding scale.  Blood sugars have been high 200s to low 300s. -Diabetes coordinator has evaluated the patient, appreciate recommendations -Continue Lantus 20 units daily -Nightly coverage -Continue moderate sensitivity sliding scale insulin -Morning BMPs -Social work consult to ensure has resources for medications  Hypertension Patient's home medications include amlodipine 10 mg daily, hydralazine 100 mg 3 times daily.  Blood pressures over the last 24 hours have ranged from 115/78-163/87. -Continue home medications at this time  Atrial flutter s/p ablation Patient's home medication include amiodarone and apixaban -Continue home amiodarone  -Holding apixaban at this time due to removal of PICC -Continuous cardiac monitoring  Hyperlipidemia No recent lipid panel on file.  Patient is on atorvastatin 40 mg daily. -Continue statin  Bipolar disorder Patient on Abilify 5 mg daily -Continue Abilify  CHF Per chart review CHF is listed in the patient's problem list but I am unable to find records of CHF diagnosis.  There are no available echo results indicating whether he has HFpEF or HFrEF.  There are also no appointments visible with cardiology.  Patient does have crackles in lung bases on exam but given he has missed 2 dialysis appointments this is most likely the cause.  Current heart failure medications include Bumex 2 mg twice daily on all days not at dialysis, carvedilol 12.5 mg daily. -We will monitor fluid status -Reassess after dialysis appointment -Cardiac echo ordered due to bacteremia  GERD Home medications include Pepcid and Protonix -Continue home Pepcid and  Protonix  Polysubstance abuse Per chart review patient has history of positive UDS for cocaine and THC. -Social work consult -Monitor for signs of withdrawal  Hx of Homelessness Per chart review patient has history of homelessness.  Patient reports that he has not homeless at this time and that he is moving back to [facility] soon.  Patient does acknowledge that he missed his dialysis appointment because his ride to the dialysis appointment fell through. -Social work consulted for confirmation that patient has stable living situation  Buttocks wound Patient reports that he was prescribed doxycycline for this and that he is taking it.  He reports that it was drained by the emergency room doctors twice.  On exam it does not appear infected it appears to be healing well.  Of note the prescriptions for the doxycycline were not picked up from the pharmacy. -Wound care has been consulted, appreciate recommendations -Wet to dry -Holding antibiotics at this time  FEN/GI: Carb modified PPx: Heparin  Disposition: Discharge will be at least Friday.  Patient will take IV holiday starting tomorrow morning after dialysis, port will be reestablished planning for Friday.  Subjective:  Patient is doing well this morning sitting on side of the bed.  His port has been removed.  Patient is concerned because he has a court date that he says is today at 81 AM and he said that he needs to leave.  We highly recommend he does not leave and informed them that it will be him leaving [place] if he were to leave.  We tell the patient that we will work on getting a note for him for his court date.  He is agreeable to stay.  He does have questions regarding pain control because he says that his right chest is still hurting from where the port was placed.  Objective: Temp:  [97.8 F (36.6 C)-99.7 F (37.6 C)] 98.3 F (36.8 C) (06/30 0448) Pulse Rate:  [84-95] 89 (06/30 0448) Resp:  [12-20] 18 (06/30  0448) BP: (126-178)/(74-96) 144/96 (06/30 0448) SpO2:  [96 %-100 %] 100 % (06/30 0448) Weight:  [128.3 kg-132.2 kg] 128.3 kg (06/29 1128) Physical Exam: General: Sitting with legs hanging over the side of the bed.  No acute distress Cardiovascular: Regular rate and rhythm with no murmurs appreciated Respiratory: Lungs are clear to auscultation bilaterally with a normal work of breathing Abdomen: Soft, nontender, positive bowel sounds Extremities: Patient has healing wound on right lower extremity s/p bilateral BKA's.  Right lower extremity wound appears unchanged from initial evaluation.  No erythema or warmth around the healing wound  Laboratory: Recent Labs  Lab 06/09/20 1135 06/09/20 1430 06/10/20 0729  WBC 11.4*  --  12.4*  HGB 8.7* 10.5* 7.5*  HCT 29.7* 31.0* 24.3*  PLT 285  --  292   Recent Labs  Lab 06/10/20 0444 06/10/20 0729 06/11/20 0631  NA 131* 131* 133*  K 4.1 4.0 3.5  CL 98 98 99  CO2 18* 19* 22  BUN 56* 57* 41*  CREATININE 10.81* 11.15* 8.22*  CALCIUM 8.3* 8.2* 8.1*  GLUCOSE 227* 210* 111*   Blood cultures-positive for Enterococcus, awaiting speciation  Imaging/Diagnostic Tests: DG Tibia/Fibula Right  Result Date: 06/11/2020 CLINICAL DATA:  Fever.  Evaluation for osteomyelitis. EXAM: RIGHT TIBIA AND FIBULA - 2 VIEW COMPARISON:  06/10/2020. FINDINGS: Soft tissue swelling. Irregularity of the distal right tibial stump. It cannot be determined this is  postsurgical or related to an active process such as osteomyelitis. Distal fibula appears unremarkable. Peripheral vascular calcification. IMPRESSION: 1.  Soft tissue swelling. 2. Regular the distal tibial stump. It cannot be determined this is postsurgical related to an active process such as osteomyelitis. If further evaluation is needed MRI can be obtained. 3.  Peripheral vascular disease. Electronically Signed   By: Marcello Moores  Register   On: 06/11/2020 14:28   IR Removal Tun Cv Cath W/O FL  Result Date:  06/11/2020 INDICATION: Patient with a history of end-stage renal disease now admitted to the hospital with bacteremia. Interventional radiology asked to remove tunneled HD catheter for line holiday. EXAM: REMOVAL TUNNELED CENTRAL VENOUS CATHETER MEDICATIONS: 1% lidocaine 20 mL COMPLICATIONS: None immediate. PROCEDURE: Informed written consent was obtained from the patient after a thorough discussion of the procedural risks, benefits and alternatives. All questions were addressed. Maximal Sterile Barrier Technique was utilized including caps, mask, sterile gowns, sterile gloves, sterile drape, hand hygiene and skin antiseptic. A timeout was performed prior to the initiation of the procedure. The patient's right chest and catheter was prepped and draped in a normal sterile fashion. Heparin was removed from both ports of catheter. 1% lidocaine was used for local anesthesia. Using gentle blunt dissection the cuff of the catheter was exposed and the catheter was removed in it's entirety. Pressure was held till hemostasis was obtained. A sterile dressing was applied. The patient tolerated the procedure well with no immediate complications. IMPRESSION: Successful catheter removal as described above. Read by: Soyla Dryer, NP Electronically Signed   By: Corrie Mckusick D.O.   On: 06/11/2020 16:37   DG Knee Right Port  Result Date: 06/10/2020 CLINICAL DATA:  Right knee pain, history of below-knee amputation EXAM: PORTABLE RIGHT KNEE - 1-2 VIEW COMPARISON:  None. FINDINGS: Frontal and cross-table lateral views of the right knee are obtained. Heterotopic ossification is seen between the proximal tibia and fibula, compatible with postsurgical changes after below-knee amputation. No acute or destructive bony lesions. Mild 3 compartment osteoarthritis greatest in the patellofemoral compartment. No joint effusion. Mild diffuse subcutaneous edema. Extensive atherosclerosis. IMPRESSION: 1. Postsurgical changes from below-knee  amputation. 2. Mild osteoarthritis. 3. Diffuse subcutaneous edema. Electronically Signed   By: Randa Ngo M.D.   On: 06/10/2020 19:28   ECHOCARDIOGRAM COMPLETE  Result Date: 06/11/2020    ECHOCARDIOGRAM REPORT   Patient Name:   Shellie Kievit Date of Exam: 06/11/2020 Medical Rec #:  [id number]   Height:       76.0 in Accession #:    [id number]  Weight:       291.5 lb Date of Birth:  1971/03/07   BSA:          2.600 m Patient Age:    13 years    BP:           141/89 mmHg Patient Gender: M           HR:           95 bpm. Exam Location:  Inpatient Procedure: 2D Echo Indications:    Bacteremia R78.81  History:        Patient has no prior history of Echocardiogram examinations.                 Arrythmias:Atrial Fibrillation; Risk Factors:Diabetes.  Sonographer:    Mikki Santee RDCS (AE) Referring Phys: [id number] [place]  1. Left ventricular ejection fraction, by estimation, is 45 to 50%. The left ventricle has mildly decreased function.  The left ventricle demonstrates global hypokinesis. There is moderate concentric left ventricular hypertrophy. Left ventricular diastolic parameters are consistent with Grade II diastolic dysfunction (pseudonormalization). Elevated left atrial pressure.  2. Right ventricular systolic function is normal. The right ventricular size is mildly enlarged. Moderately increased right ventricular wall thickness. There is severely elevated pulmonary artery systolic pressure. The estimated right ventricular systolic pressure is 01.6 mmHg.  3. Left atrial size was severely dilated.  4. A slightly mobile echodensity in the right atrium is likely the tip of a central venous catheter. Right atrial size was severely dilated.  5. Moderate vegetation on the mitral valve.  6. The mitral valve is normal in structure. Moderate to severe mitral valve regurgitation.  7. Tricuspid valve regurgitation is moderate.  8. The aortic valve is tricuspid. Aortic valve regurgitation is not  visualized. No aortic stenosis is present.  9. Aortic dilatation noted. There is borderline dilatation of the aortic root measuring 38 mm. 10. The inferior vena cava is dilated in size with <50% respiratory variability, suggesting right atrial pressure of 15 mmHg. Conclusion(s)/Recommendation(s): Findings are highly consistent with mitral valve infective endocarditis. The posterior leaflet vegetation is sessile and has limited mobility, therefore only moderate embolic risk. There is significant mitral insufficiency, Recommend follow-up TEE after completion of antibiotic therapy if he is a surgical candidate. Consult Cardiology sooner if clinically unstable. Critical finding discussed with Dr. Megan Salon. FINDINGS  Left Ventricle: Left ventricular ejection fraction, by estimation, is 45 to 50%. The left ventricle has mildly decreased function. The left ventricle demonstrates global hypokinesis. The left ventricular internal cavity size was normal in size. There is  moderate concentric left ventricular hypertrophy. Left ventricular diastolic parameters are consistent with Grade II diastolic dysfunction (pseudonormalization). Elevated left atrial pressure. Right Ventricle: The right ventricular size is mildly enlarged. Moderately increased right ventricular wall thickness. Right ventricular systolic function is normal. There is severely elevated pulmonary artery systolic pressure. The tricuspid regurgitant  velocity is 3.53 m/s, and with an assumed right atrial pressure of 15 mmHg, the estimated right ventricular systolic pressure is 01.0 mmHg. Left Atrium: Left atrial size was severely dilated. Right Atrium: A slightly mobile echodensity in the right atrium is likely the tip of a central venous catheter. Right atrial size was severely dilated. Pericardium: There is no evidence of pericardial effusion. Mitral Valve: The mitral valve is normal in structure. A moderate vegetation is seen on the posterior mitral leaflet.  The MV vegetation measures 12 mm x 15 mm. Moderate to severe mitral valve regurgitation, with centrally-directed jet. Tricuspid Valve: The tricuspid valve is normal in structure. Tricuspid valve regurgitation is moderate. Aortic Valve: The aortic valve is tricuspid. Aortic valve regurgitation is not visualized. No aortic stenosis is present. Pulmonic Valve: The pulmonic valve was normal in structure. Pulmonic valve regurgitation is not visualized. Aorta: Aortic dilatation noted. There is borderline dilatation of the aortic root measuring 38 mm. Venous: The inferior vena cava is dilated in size with less than 50% respiratory variability, suggesting right atrial pressure of 15 mmHg. IAS/Shunts: No atrial level shunt detected by color flow Doppler.  LEFT VENTRICLE PLAX 2D LVIDd:         4.80 cm  Diastology LVIDs:         3.90 cm  LV e' lateral: 11.60 cm/s LV PW:         1.70 cm  LV e' medial:  6.31 cm/s LV IVS:        1.80 cm LVOT diam:  2.20 cm LV SV:         77 LV SV Index:   30 LVOT Area:     3.80 cm  RIGHT VENTRICLE RV S prime:     10.44 cm/s TAPSE (M-mode): 1.9 cm LEFT ATRIUM              Index       RIGHT ATRIUM           Index LA diam:        5.20 cm  2.00 cm/m  RA Area:     26.20 cm LA Vol (A2C):   150.0 ml 57.70 ml/m RA Volume:   92.90 ml  35.73 ml/m LA Vol (A4C):   91.6 ml  35.23 ml/m LA Biplane Vol: 118.0 ml 45.39 ml/m  AORTIC VALVE LVOT Vmax:   123.00 cm/s LVOT Vmean:  75.600 cm/s LVOT VTI:    0.203 m  AORTA Ao Root diam: 3.80 cm MR Peak grad: 62.9 mmHg   TRICUSPID VALVE MR Vmax:      396.50 cm/s TR Peak grad:   49.8 mmHg                           TR Vmax:        353.00 cm/s                            SHUNTS                           Systemic VTI:  0.20 m                           Systemic Diam: 2.20 cm Sanda Klein MD Electronically signed by Sanda Klein MD Signature Date/Time: 06/11/2020/2:54:47 PM    Final      Gifford Shave, MD 06/12/2020, 5:52 AM PGY-1, [place] Intern pager: [phone], text pages welcome

## 2020-06-12 NOTE — Plan of Care (Signed)
  Problem: Pain Managment: Goal: General experience of comfort will improve Outcome: Progressing   Problem: Safety: Goal: Ability to remain free from injury will improve Outcome: Progressing   

## 2020-06-13 ENCOUNTER — Encounter (HOSPITAL_COMMUNITY): Payer: Self-pay | Admitting: Family Medicine

## 2020-06-13 ENCOUNTER — Inpatient Hospital Stay (HOSPITAL_COMMUNITY): Payer: Medicaid Other

## 2020-06-13 ENCOUNTER — Inpatient Hospital Stay (HOSPITAL_COMMUNITY): Payer: Medicaid Other | Admitting: Anesthesiology

## 2020-06-13 ENCOUNTER — Encounter (HOSPITAL_COMMUNITY)
Admission: AD | Disposition: A | Discharge status: Home / Self Care | Payer: Self-pay | Source: Home / Self Care | Attending: Family Medicine

## 2020-06-13 DIAGNOSIS — R7881 Bacteremia: Secondary | ICD-10-CM

## 2020-06-13 DIAGNOSIS — I340 Nonrheumatic mitral (valve) insufficiency: Secondary | ICD-10-CM

## 2020-06-13 DIAGNOSIS — I339 Acute and subacute endocarditis, unspecified: Secondary | ICD-10-CM

## 2020-06-13 DIAGNOSIS — N185 Chronic kidney disease, stage 5: Secondary | ICD-10-CM

## 2020-06-13 DIAGNOSIS — E118 Type 2 diabetes mellitus with unspecified complications: Secondary | ICD-10-CM

## 2020-06-13 HISTORY — PX: TEE WITHOUT CARDIOVERSION: SHX5443

## 2020-06-13 LAB — CULTURE, BLOOD (SINGLE)

## 2020-06-13 LAB — BASIC METABOLIC PANEL
Anion gap: 13 (ref 5–15)
BUN: 43 mg/dL — ABNORMAL HIGH (ref 6–20)
CO2: 21 mmol/L — ABNORMAL LOW (ref 22–32)
Calcium: 8.2 mg/dL — ABNORMAL LOW (ref 8.9–10.3)
Chloride: 97 mmol/L — ABNORMAL LOW (ref 98–111)
Creatinine, Ser: 7.55 mg/dL — ABNORMAL HIGH (ref 0.61–1.24)
GFR calc Af Amer: 9 mL/min — ABNORMAL LOW (ref >60)
GFR calc non Af Amer: 8 mL/min — ABNORMAL LOW (ref >60)
Glucose, Bld: 206 mg/dL — ABNORMAL HIGH (ref 70–99)
Potassium: 4.1 mmol/L (ref 3.5–5.1)
Sodium: 131 mmol/L — ABNORMAL LOW (ref 135–145)

## 2020-06-13 LAB — CBC
HCT: 25.3 % — ABNORMAL LOW (ref 39.0–52.0)
Hemoglobin: 7.8 g/dL — ABNORMAL LOW (ref 13.0–17.0)
MCH: 28.4 pg (ref 26.0–34.0)
MCHC: 30.8 g/dL (ref 30.0–36.0)
MCV: 92 fL (ref 80.0–100.0)
Platelets: 250 10*3/uL (ref 150–400)
RBC: 2.75 MIL/uL — ABNORMAL LOW (ref 4.22–5.81)
RDW: 17.2 % — ABNORMAL HIGH (ref 11.5–15.5)
WBC: 14.5 10*3/uL — ABNORMAL HIGH (ref 4.0–10.5)
nRBC: 0 % (ref 0.0–0.2)

## 2020-06-13 LAB — GLUCOSE, CAPILLARY
Glucose-Capillary: 176 mg/dL — ABNORMAL HIGH (ref 70–99)
Glucose-Capillary: 197 mg/dL — ABNORMAL HIGH (ref 70–99)
Glucose-Capillary: 234 mg/dL — ABNORMAL HIGH (ref 70–99)
Glucose-Capillary: 241 mg/dL — ABNORMAL HIGH (ref 70–99)

## 2020-06-13 SURGERY — ECHOCARDIOGRAM, TRANSESOPHAGEAL
Anesthesia: Monitor Anesthesia Care

## 2020-06-13 MED ORDER — PROPOFOL 500 MG/50ML IV EMUL
INTRAVENOUS | Status: DC | PRN
  Administered 2020-06-13: 100 ug/kg/min via INTRAVENOUS

## 2020-06-13 MED ORDER — CHLORHEXIDINE GLUCONATE CLOTH 2 % EX PADS
6.0000 | MEDICATED_PAD | Freq: Every day | CUTANEOUS | Status: DC
  Administered 2020-06-26: 6 via TOPICAL

## 2020-06-13 MED ORDER — SODIUM CHLORIDE 0.9 % IV SOLN
850.0000 mg | INTRAVENOUS | Status: DC
  Administered 2020-06-13 – 2020-06-19 (×4): 850 mg via INTRAVENOUS
  Filled 2020-06-13 (×4): qty 17

## 2020-06-13 MED ORDER — DARBEPOETIN ALFA 150 MCG/0.3ML IJ SOSY
150.0000 ug | PREFILLED_SYRINGE | INTRAMUSCULAR | Status: DC
  Administered 2020-06-14: 150 ug via INTRAVENOUS
  Filled 2020-06-13 (×2): qty 0.3

## 2020-06-13 MED ORDER — LINEZOLID 600 MG PO TABS
600.0000 mg | ORAL_TABLET | Freq: Two times a day (BID) | ORAL | Status: DC
  Administered 2020-06-13 – 2020-07-07 (×46): 600 mg via ORAL
  Filled 2020-06-13 (×52): qty 1

## 2020-06-13 MED ORDER — LIDOCAINE 2% (20 MG/ML) 5 ML SYRINGE
INTRAMUSCULAR | Status: DC | PRN
  Administered 2020-06-13: 60 mg via INTRAVENOUS

## 2020-06-13 MED ORDER — INSULIN GLARGINE 100 UNIT/ML ~~LOC~~ SOLN
25.0000 [IU] | Freq: Every day | SUBCUTANEOUS | Status: DC
  Administered 2020-06-14 – 2020-06-18 (×5): 25 [IU] via SUBCUTANEOUS
  Filled 2020-06-13 (×6): qty 0.25

## 2020-06-13 MED ORDER — INSULIN GLARGINE 100 UNIT/ML ~~LOC~~ SOLN
5.0000 [IU] | Freq: Once | SUBCUTANEOUS | Status: AC
  Administered 2020-06-13: 5 [IU] via SUBCUTANEOUS
  Filled 2020-06-13: qty 0.05

## 2020-06-13 NOTE — Anesthesia Postprocedure Evaluation (Signed)
Anesthesia Post Note  Patient: Lucas [name]  Procedure(s) Performed: TRANSESOPHAGEAL ECHOCARDIOGRAM (TEE) (N/A )     Patient location during evaluation: Endoscopy Anesthesia Type: MAC Level of consciousness: awake and alert Pain management: pain level controlled Vital Signs Assessment: post-procedure vital signs reviewed and stable Respiratory status: spontaneous breathing, nonlabored ventilation and respiratory function stable Cardiovascular status: stable and blood pressure returned to baseline Postop Assessment: no apparent nausea or vomiting Anesthetic complications: no   No complications documented.  Last Vitals:  Vitals:   06/13/20 1041 06/13/20 1050  BP: 132/75 114/72  Pulse: 82 80  Resp: 20 (!) 23  Temp: 36.7 C   SpO2: 100% 98%    Last Pain:  Vitals:   06/13/20 1050  TempSrc:   PainSc: 0-No pain                 Dashauna Heymann,W. EDMOND

## 2020-06-13 NOTE — Progress Notes (Signed)
Patient ID: Lucas [name], male   DOB: [birth year], 49 y.o.   MRN: [id number]         [facility] for Infectious Disease  Date of Admission:  06/09/2020           Day 4 vancomycin ASSESSMENT: TEE this morning confirmed a mitral valve endocarditis and showed:  FINDINGS:  Mildly depressed left ventricular systolic function. Dilated right ventricle with preserved systolic function. Severe biatrial dilation. Medium sized, mostly fixed, broad based vegetation on the posterior mitral leaflet, at the P2-P3 junction. There is a small component of the vegetation which is more mobile. The posterior leaflet is perforated in the center of the vegetation. There is moderate to severe mitral insufficiency through the perforation (flow reversal in the RUPV, not in the LUPV). The tricuspid valve does not have vegetations, but there is severe tricuspid regurgitation. No vegetations are seen on the aortic or pulmonic valves.  I am in agreement with evaluation by cardiac surgery.  His enterococcal isolate is resistant to gentamicin.  Standard therapy in his situation would be daily IV ceftriaxone plus ampicillin but he is not a candidate for outpatient IV antibiotic therapy given his unstable living situation.  We will treat him with IV daptomycin dosed after hemodialysis and oral linezolid.  PLAN: 1. Change vancomycin to IV daptomycin and oral linezolid 2. Await results of repeat blood cultures  Principal Problem:   Bacteremia due to Enterococcus Active Problems:   Endocarditis of mitral valve   Hyperglycemia due to diabetes mellitus (HCC)   Anemia associated with stage 5 chronic renal failure (HCC)   ESRD on hemodialysis (HCC)   Scheduled Meds: . amiodarone  200 mg Oral Daily  . amLODipine  10 mg Oral Daily  . ARIPiprazole  5 mg Oral Daily  . atorvastatin  40 mg Oral Daily  . carvedilol  12.5 mg Oral BID  . Chlorhexidine Gluconate Cloth  6 each Topical Q0600  . Chlorhexidine Gluconate  Cloth  6 each Topical Q0600  . [START ON 06/14/2020] Chlorhexidine Gluconate Cloth  6 each Topical Q0600  . [START ON 06/14/2020] darbepoetin (ARANESP) injection - DIALYSIS  150 mcg Intravenous Q Fri-HD  . doxercalciferol  4 mcg Oral Q T,Th,Sa-HD  . heparin injection (subcutaneous)  5,000 Units Subcutaneous Q8H  . hydrALAZINE  100 mg Oral TID  . insulin aspart  0-15 Units Subcutaneous TID WC  . insulin aspart  0-5 Units Subcutaneous QHS  . [START ON 06/14/2020] insulin glargine  25 Units Subcutaneous Daily  . insulin glargine  5 Units Subcutaneous Once  . mupirocin ointment  1 application Nasal BID  . pantoprazole  40 mg Oral Daily  . pregabalin  25 mg Oral QHS  . ramelteon  8 mg Oral QHS  . sevelamer carbonate  1,600 mg Oral TID with meals  . tamsulosin  0.4 mg Oral Daily  . vancomycin variable dose per unstable renal function (pharmacist dosing)   Does not apply See admin instructions   Continuous Infusions: PRN Meds:.acetaminophen, lidocaine, traMADol   Review of Systems: Review of Systems  Unable to perform ROS: Mental acuity    No Known Allergies  OBJECTIVE: Vitals:   06/13/20 0907 06/13/20 1041 06/13/20 1050 06/13/20 1059  BP: 135/80 132/75 114/72 116/70  Pulse: 83 82 80 79  Resp: 12 20 (!) 23 16  Temp: 98.8 F (37.1 C) 98 F (36.7 C)    TempSrc: Oral Axillary    SpO2: 99% 100% 98% 100%  Weight:  Height:       Body mass index is 35.99 kg/m.  Physical Exam Constitutional:      Comments: He is very groggy following TEE this morning and falls off to sleep repeatedly.     Lab Results Lab Results  Component Value Date   WBC 14.5 (H) 06/13/2020   HGB 7.8 (L) 06/13/2020   HCT 25.3 (L) 06/13/2020   MCV 92.0 06/13/2020   PLT 250 06/13/2020    Lab Results  Component Value Date   CREATININE 7.55 (H) 06/13/2020   BUN 43 (H) 06/13/2020   NA 131 (L) 06/13/2020   K 4.1 06/13/2020   CL 97 (L) 06/13/2020   CO2 21 (L) 06/13/2020    Lab Results  Component Value  Date   ALT 22 05/22/2020   AST 27 05/22/2020   ALKPHOS 243 (H) 05/22/2020   BILITOT 0.5 05/22/2020     Microbiology: Recent Results (from the past 240 hour(s))  SARS Coronavirus 2 by RT PCR (hospital order, performed in [place] hospital lab) Nasopharyngeal Nasopharyngeal Swab     Status: None   Collection Time: 06/09/20  3:11 PM   Specimen: Nasopharyngeal Swab  Result Value Ref Range Status   SARS Coronavirus 2 NEGATIVE NEGATIVE Final    Comment: (NOTE) SARS-CoV-2 target nucleic acids are NOT DETECTED.  The SARS-CoV-2 RNA is generally detectable in upper and lower respiratory specimens during the acute phase of infection. The lowest concentration of SARS-CoV-2 viral copies this assay can detect is 250 copies / mL. A negative result does not preclude SARS-CoV-2 infection and should not be used as the sole basis for treatment or other patient management decisions.  A negative result may occur with improper specimen collection / handling, submission of specimen other than nasopharyngeal swab, presence of viral mutation(s) within the areas targeted by this assay, and inadequate number of viral copies (<250 copies / mL). A negative result must be combined with clinical observations, patient history, and epidemiological information.  Fact Sheet for Patients:   StrictlyIdeas.no  Fact Sheet for Healthcare Providers: BankingDealers.co.za  This test is not yet approved or  cleared by the [place] FDA and has been authorized for detection and/or diagnosis of SARS-CoV-2 by FDA under an Emergency Use Authorization (EUA).  This EUA will remain in effect (meaning this test can be used) for the duration of the COVID-19 declaration under Section 564(b)(1) of the Act, 21 U.S.C. section 360bbb-3(b)(1), unless the authorization is terminated or revoked sooner.  Performed at [facility], [place] [address], [place], [place] [address]     Culture, blood (single)     Status: Abnormal (Preliminary result)   Collection Time: 06/09/20  9:22 PM   Specimen: BLOOD RIGHT HAND  Result Value Ref Range Status   Specimen Description BLOOD RIGHT HAND  Final   Special Requests   Final    BOTTLES DRAWN AEROBIC AND ANAEROBIC Blood Culture adequate volume   Culture  Setup Time   Final    GRAM POSITIVE COCCI IN CLUSTERS AEROBIC BOTTLE ONLY IN BOTH AEROBIC AND ANAEROBIC BOTTLES CRITICAL RESULT CALLED TO, READ BACK BY AND VERIFIED WITH: PHARMD EMILY S [phone] [id number] FCP    Culture (A)  Final    ENTEROCOCCUS FAECALIS Sent to [facility] for further susceptibility testing. Performed at [facility], [place] [address] [place], [place] [address]    Report Status PENDING  Incomplete   Organism ID, Bacteria ENTEROCOCCUS FAECALIS  Final      Susceptibility  Enterococcus faecalis - MIC*    AMPICILLIN <=2 SENSITIVE Sensitive     VANCOMYCIN 1 SENSITIVE Sensitive     GENTAMICIN SYNERGY RESISTANT Resistant     * ENTEROCOCCUS FAECALIS  Blood Culture ID Panel (Reflexed)     Status: Abnormal   Collection Time: 06/09/20  9:22 PM  Result Value Ref Range Status   Enterococcus species DETECTED (A) NOT DETECTED Final    Comment: CRITICAL RESULT CALLED TO, READ BACK BY AND VERIFIED WITH: PHARMD EMILY S [id number] FCP    Vancomycin resistance NOT DETECTED NOT DETECTED Final   Listeria monocytogenes NOT DETECTED NOT DETECTED Final   Staphylococcus species NOT DETECTED NOT DETECTED Final   Staphylococcus aureus (BCID) NOT DETECTED NOT DETECTED Final   Streptococcus species NOT DETECTED NOT DETECTED Final   Streptococcus agalactiae NOT DETECTED NOT DETECTED Final   Streptococcus pneumoniae NOT DETECTED NOT DETECTED Final   Streptococcus pyogenes NOT DETECTED NOT DETECTED Final   Acinetobacter baumannii NOT DETECTED NOT DETECTED Final   Enterobacteriaceae species NOT DETECTED NOT DETECTED Final   Enterobacter cloacae complex NOT DETECTED NOT DETECTED  Final   Escherichia coli NOT DETECTED NOT DETECTED Final   Klebsiella oxytoca NOT DETECTED NOT DETECTED Final   Klebsiella pneumoniae NOT DETECTED NOT DETECTED Final   Proteus species NOT DETECTED NOT DETECTED Final   Serratia marcescens NOT DETECTED NOT DETECTED Final   Haemophilus influenzae NOT DETECTED NOT DETECTED Final   Neisseria meningitidis NOT DETECTED NOT DETECTED Final   Pseudomonas aeruginosa NOT DETECTED NOT DETECTED Final   Candida albicans NOT DETECTED NOT DETECTED Final   Candida glabrata NOT DETECTED NOT DETECTED Final   Candida krusei NOT DETECTED NOT DETECTED Final   Candida parapsilosis NOT DETECTED NOT DETECTED Final   Candida tropicalis NOT DETECTED NOT DETECTED Final    Comment: Performed at [facility], [address] [place], [place] [address]  MRSA PCR Screening     Status: Abnormal   Collection Time: 06/09/20 11:29 PM   Specimen: Nasal Mucosa; Nasopharyngeal  Result Value Ref Range Status   MRSA by PCR POSITIVE (A) NEGATIVE Final    Comment:        The GeneXpert MRSA Assay (FDA approved for NASAL specimens only), is one component of a comprehensive MRSA colonization surveillance program. It is not intended to diagnose MRSA infection nor to guide or monitor treatment for MRSA infections. RESULT CALLED TO, READ BACK BY AND VERIFIED WITH: ISAACS,T RN 0104 06/10/2020 MITCHELL,L Performed at [facility], [place] [address], [place], [place] [address]   Culture, blood (single)     Status: Abnormal   Collection Time: 06/10/20  1:25 PM   Specimen: BLOOD  Result Value Ref Range Status   Specimen Description BLOOD HEMODIALYSIS CATHETER  Final   Special Requests   Final    BOTTLES DRAWN AEROBIC AND ANAEROBIC Blood Culture results may not be optimal due to an excessive volume of blood received in culture bottles   Culture  Setup Time   Final    IN BOTH AEROBIC AND ANAEROBIC BOTTLES GRAM POSITIVE COCCI CRITICAL VALUE NOTED.  VALUE IS CONSISTENT WITH  PREVIOUSLY REPORTED AND CALLED VALUE.    Culture (A)  Final    ENTEROCOCCUS FAECALIS SUSCEPTIBILITIES PERFORMED ON PREVIOUS CULTURE WITHIN THE LAST 5 DAYS. Performed at [facility], [place] [address], [place], [place] [address]    Report Status 06/13/2020 FINAL  Final  Culture, blood (routine x 2)     Status:  None (Preliminary result)   Collection Time: 06/12/20  6:44 AM   Specimen: BLOOD LEFT HAND  Result Value Ref Range Status   Specimen Description BLOOD LEFT HAND  Final   Special Requests   Final    BOTTLES DRAWN AEROBIC ONLY Blood Culture adequate volume   Culture   Final    NO GROWTH < 24 HOURS Performed at [facility], [address], [place], [place] [address]    Report Status PENDING  Incomplete  Culture, blood (routine x 2)     Status: None (Preliminary result)   Collection Time: 06/12/20  6:49 AM   Specimen: BLOOD  Result Value Ref Range Status   Specimen Description BLOOD SITE NOT SPECIFIED  Final   Special Requests   Final    BOTTLES DRAWN AEROBIC ONLY Blood Culture adequate volume   Culture   Final    NO GROWTH < 24 HOURS Performed at [facility], [place] [address] [place], [place] [address]    Report Status PENDING  Incomplete    Michel Bickers, MD [facility] for Infectious [place] [phone] pager   [phone] cell 06/13/2020, 1:56 PM

## 2020-06-13 NOTE — Transfer of Care (Signed)
Immediate Anesthesia Transfer of Care Note  Patient: Lucas [name]  Procedure(s) Performed: TRANSESOPHAGEAL ECHOCARDIOGRAM (TEE) (N/A )  Patient Location: Endoscopy Unit  Anesthesia Type:MAC  Level of Consciousness: awake, alert , oriented and patient cooperative  Airway & Oxygen Therapy: Patient Spontanous Breathing and Patient connected to nasal cannula oxygen  Post-op Assessment: Report given to RN, Post -op Vital signs reviewed and stable and Patient moving all extremities  Post vital signs: Reviewed and stable  Last Vitals:  Vitals Value Taken Time  BP    Temp    Pulse    Resp    SpO2      Last Pain:  Vitals:   06/13/20 0907  TempSrc: Oral  PainSc: 7          Complications: No complications documented.

## 2020-06-13 NOTE — Progress Notes (Signed)
Renal Navigator met with patient at bedside to inform him that he has been declined at all three OP HD clinics in [facility] due to extreme non-compliance. He seems frustrated that people "don't understand" homelessness, etc. Navigator acknowledged that it is difficult to stay on a schedule when basic needs are not met. Navigator asked patient again today if he has the address of his home with his friend in [place], as he told CM yesterday. He says that they still don't have it. Navigator asked if this is because they don't actually have the residence and he confirmed. He states they are applying for a home and thinks they will have it next week. Navigator asked if applying for housing in [place] is an option since that is where his HD clinic is. He said, "I don't want to live in [place]." He thinks he will continue to get Medicaid Transportation even though I have stated that I do not think this is the case. Navigator asked patient to contact Medicaid Transportation to confirm that he can continue to be transported to another county. He started to dial the phone immediately.  Navigator updated CM.  Alphonzo Cruise, [place] Renal Navigator [phone]

## 2020-06-13 NOTE — Progress Notes (Signed)
[facility] Progress Note  Name: Lucas [name] MRN: [id number] DOB: 22-Dec-1970   Subjective:  Last had HD on 6/29 with 3.8 kg UF.  States has test on his heart today and has been NPO for same.     Review of systems: denies nausea denies shortness of breath.  discomfort around catheter site better but still there.    ------- Background on consult:  Lucas [name] is an 49 y.o. male DM HTN CHF GERD Polysubstance abuse HLD aflutter s/ ablation (on amiodarone and apixaban) ESRD TTS in [facility] presenting with pain starting past 24hrs in the right side of chest over port radiating to the neck and worse with palpation or movement. No associated with exertion but he states he's had intermittent fevers/ chills. Denies n/v/ abdominal pain. He also denies headaches, syncopal episodes or sick contacts. He is homeless and recently lost his housing now going to move in with friend Lucas [name] in [place]. His last dialysis treatment was on Thursday in [place]. He has recently had drainage of a buttocks abscess x2 and is supposed to be on doxycycline for that wound.    Intake/Output Summary (Last 24 hours) at 06/13/2020 0656 Last data filed at 06/13/2020 0440 Gross per 24 hour  Intake 1100 ml  Output 0 ml  Net 1100 ml    Vitals:  Vitals:   06/12/20 0818 06/12/20 1628 06/12/20 2027 06/13/20 0440  BP: (!) 133/95 118/64 129/76 134/89  Pulse: 88 87 88 85  Resp: 18 18 18 19   Temp: 98.6 F (37 C) 98 F (36.7 C) 97.6 F (36.4 C) 98.2 F (36.8 C)  TempSrc: Oral Oral    SpO2: 100% 98% 100% 100%  Weight:    134.1 kg  Height:         Physical Exam:  General adult male in bed in no acute distress HEENT normocephalic atraumatic extraocular movements intact sclera anicteric Neck supple trachea midline Lungs clear to auscultation bilaterally normal work of breathing at rest  Heart S1S2 no rub Abdomen soft nontender nondistended Extremities trace to 1+ edema resid limbs bilateral lower  extremities Psych normal mood and affect Neuro - alert and oriented x3 conversant and follows commands Access RIJ tunneled catheter has been removed    Medications reviewed   Labs:  BMP Latest Ref Rng & Units 06/13/2020 06/12/2020 06/11/2020  Glucose 70 - 99 mg/dL 206(H) 335(H) 111(H)  BUN 6 - 20 mg/dL 43(H) 32(H) 41(H)  Creatinine 0.61 - 1.24 mg/dL 7.55(H) 6.38(H) 8.22(H)  Sodium 135 - 145 mmol/L 131(L) 130(L) 133(L)  Potassium 3.5 - 5.1 mmol/L 4.1 3.6 3.5  Chloride 98 - 111 mmol/L 97(L) 97(L) 99  CO2 22 - 32 mmol/L 21(L) 22 22  Calcium 8.9 - 10.3 mg/dL 8.2(L) 8.1(L) 8.1(L)    Outpatient HD orders:   [facility]) Phone [phone] Scheduled TTS 4 hours His outpatient unit reports noncompliance BF 400; DF 800 F250 dialyzer  2/2.5 ca bath EDW 250 lbs Last weight 272.2 lbs on 6/24 (signed off early and had skipped the treatment prior) aranesp 120 mcg weekly  hectoral 4 mcg each tx    Assessment/Plan:   1. Enterococcus bacteremia  - s/p removal of tunneled catheter on 6/29 for line holiday - ID was consulted - appreciate ID and primary team  - on abx per primary team and ID - would hold eliquis 5 mg BID for now given need for replacing dialysis catheter   2. Mitral valve endocarditis  -  mitral valve vegetation noted on TTE on 6/29  3 ESRD: - Last last HD on 6/29 per TTS schedule.  Tunneled catheter was removed for line holiday for bacteremia  - Assess dialysis needs daily  - Will consult IR for replacement of dialysis catheter on 7/2 with IR - nontunneled     - He has been informed that he has to go to dialysis at his home unit ([facility] unit in [place]) and if he wishes to change to a different unit they will need to get him set up. HD SW is working on transportation issue identified in getting to his home clinic  4. Anemia of ESRD:  For ESA every Thursday - increased aranesp to 150 mcg every thursday. Normally aranesp as  outpatient as above  5. Metabolic Bone Disease:On sevelamer 1600 mg 3 times daily at home - continue. Continue hectorol   6. Hypertension:  - continue on current regimen   7DM - per primary team   8. buttock abscess: Status post drainage in the ER x2 previously; is supposed to be on doxycycline per charting.  Per primary team. Needs to follow-up with wound carein [place].   Disposition - continue inpatient monitoring  Lucas Desanctis, MD 06/13/2020 7:09 AM

## 2020-06-13 NOTE — Anesthesia Preprocedure Evaluation (Signed)
Anesthesia Evaluation  Patient identified by MRN, date of birth, ID band Patient awake    Reviewed: Allergy & Precautions, H&P , NPO status , Patient's Chart, lab work & pertinent test results, reviewed documented beta blocker date and time   Airway Mallampati: II  TM Distance: >3 FB Neck ROM: Full    Dental no notable dental hx. (+) Teeth Intact, Dental Advisory Given   Pulmonary neg pulmonary ROS,    Pulmonary exam normal breath sounds clear to auscultation       Cardiovascular hypertension, Pt. on medications and Pt. on home beta blockers + dysrhythmias Atrial Fibrillation + Valvular Problems/Murmurs MR  Rhythm:Regular Rate:Normal     Neuro/Psych negative neurological ROS  negative psych ROS   GI/Hepatic negative GI ROS, Neg liver ROS,   Endo/Other  diabetes, Insulin DependentMorbid obesity  Renal/GU ESRF and DialysisRenal disease  negative genitourinary   Musculoskeletal   Abdominal   Peds  Hematology  (+) Blood dyscrasia, anemia ,   Anesthesia Other Findings   Reproductive/Obstetrics negative OB ROS                             Anesthesia Physical Anesthesia Plan  ASA: III  Anesthesia Plan: MAC   Post-op Pain Management:    Induction: Intravenous  PONV Risk Score and Plan: 1 and Propofol infusion  Airway Management Planned: Nasal Cannula  Additional Equipment:   Intra-op Plan:   Post-operative Plan:   Informed Consent: I have reviewed the patients History and Physical, chart, labs and discussed the procedure including the risks, benefits and alternatives for the proposed anesthesia with the patient or authorized representative who has indicated his/her understanding and acceptance.   Patient has DNR.  Discussed DNR with patient and Suspend DNR.   Dental advisory given  Plan Discussed with: CRNA  Anesthesia Plan Comments:         Anesthesia Quick  Evaluation

## 2020-06-13 NOTE — Progress Notes (Signed)
Renal Navigator discussed patient situation with Clinic Manager at [facility], who spoke with Medical Director. He will not be accepted at [facility] either.  It appears that patient's only option at this point is to remain at his current OP HD clinic[facility] and figure out transportation to this center from wherever he is residing. Navigator will update CM and patient.   Alphonzo Cruise, [place] Renal Navigator [phone]

## 2020-06-13 NOTE — Progress Notes (Signed)
Inpatient Diabetes Program Recommendations  AACE/ADA: New Consensus Statement on Inpatient Glycemic Control   Target Ranges:  Prepandial:   less than 140 mg/dL      Peak postprandial:   less than 180 mg/dL (1-2 hours)      Critically ill patients:  140 - 180 mg/dL   Results for Needham, Biggins Khamari (MRN [id number]) as of 06/13/2020 10:40  Ref. Range 06/12/2020 06:53 06/12/2020 11:16 06/12/2020 16:26 06/12/2020 19:49 06/13/2020 06:39  Glucose-Capillary Latest Ref Range: 70 - 99 mg/dL 293 (H) 262 (H) 239 (H) 261 (H) 176 (H)   Review of Glycemic Control  Diabetes history: DM2 Outpatient Diabetes medications: Lantus 25 units daily, Humalog 2-12 unitsi TID with meals Current orders for Inpatient glycemic control: Lantus 20 units daily, Novolog 0-15 units TID with meals, Novolog 0-5 units QHS  Inpatient Diabetes Program Recommendations:   Insulin - Meal Coverage: Please consider ordering Novolog 4 units TID with meals for meal coverage if patient eats at least 50% of meals.  Thanks, Barnie Alderman, RN, MSN, CDE Diabetes Coordinator Inpatient Diabetes Program [phone] (Team Pager from 8am to 5pm)

## 2020-06-13 NOTE — Interval H&P Note (Signed)
History and Physical Interval Note:  06/13/2020 9:02 AM  Lucas [name]  has presented today for surgery, with the diagnosis of BACTEREMIA,DRUG USE.  The various methods of treatment have been discussed with the patient and family. After consideration of risks, benefits and other options for treatment, the patient has consented to  Procedure(s): TRANSESOPHAGEAL ECHOCARDIOGRAM (TEE) (N/A) as a surgical intervention.  The patient's history has been reviewed, patient examined, no change in status, stable for surgery.  I have reviewed the patient's chart and labs.  Questions were answered to the patient's satisfaction.     Dent Plantz

## 2020-06-13 NOTE — Progress Notes (Signed)
Family Medicine Teaching Service Daily Progress Note Intern Pager: [phone]  Patient name: Lucas [name] Medical record number: [id number] Date of birth: 23-Sep-1971 Age: 49 y.o. Gender: male  Primary Care Provider: System, Pcp Not In Consultants: Nephrology, Infectious disease Code Status: DNR  Pt Overview and Major Events to Date:  6/27-patient admitted for hyperglycemia and port pain 6/28-blood cultures grew Enterococcus 6/29- TTE found mitral valve vegetation 6/29- Removal of right IJ HD catheter  Assessment and Plan: Lucas [name] is a 49 y.o. male presenting with hyperglycemia and pain in his chest surrounding the right subclavian port which radiates into his neck. PMH is significant for atrial flutter s/p ablation, T2DM, ESRD, hyperlipidemia, anemia, CHF, GERD, polysubstance abuse, homelessness.  Right-sided chest pain around the port Patient initially presented to the emergency department because he was having pain around his port site.  He has his port changed every Tuesday and reports that he has never had any issues with it but that he was told to watch out for signs of infection and he is concerned it is getting infected.  There is no erythema or edema around the port site.  Patient has remained afebrile over the last 24 hours.  His port was removed on his right side 6/29 afternoon and blood cultures were collected.  Cultures resulted in enterococcus.TTE showed vegetations on his mitral valve. Enterococcus was found to be Gentamycin resistant. Will look into daptomycin suscpetibility.  He is planned for a TEE EEG on 7/1. Plan for port holiday, patient will receive dialysis 7/1 morning and then will be on holiday until Friday 7/2. Discussed with patient limitation in available pain medications due ESRD. -Nephrology consulted, appreciate recommendations -Infectious disease consulted appreciate recommendations -Continue vancomycin per infectious disease - F/u Daptomycin  susceptibility -TEE  7/1 -Discontinued NPO after TEE, restarted renal diet with fluid restriction -Planning on imaging of right lower extremity to rule out osteomyelitis -Line holiday per nephrology pending dialysis  -Follow-up on repeat blood cultures from after his line was pulled -Follow-up on blood cultures for speciation of Enterococcus -Tramadol twice daily as needed for pain  Type 2 diabetes Patient presented with blood glucoses in the high 600s.  He was initially started on insulin drip and on insulin drip was discontinued and he was transitioned to subcutaneous insulin at his home dose of 20 units with moderate sensitivity sliding scale.  Blood sugars have been high 200s to low 300s. 7/1 BGL 206. -Diabetes coordinator has evaluated the patient, appreciate recommendations -Increased from Lantus 20 units daily to 25U daily -Nightly coverage -Continue moderate sensitivity sliding scale insulin -Morning BMPs -Social work consult to ensure has resources for medications  Hypertension Patient's home medications include amlodipine 10 mg daily, hydralazine 100 mg 3 times daily.  Blood pressures over the last 24 hours have ranged from 118/64-134/89. -Continue home medications at this time  Atrial flutter s/p ablation Patient's home medication include amiodarone and apixaban -Continue home amiodarone  -Holding apixaban at this time due to removal of PICC -Continuous cardiac monitoring  Hyperlipidemia No recent lipid panel on file. Patient is on atorvastatin 40 mg daily. -Continue statin  Bipolar disorder Patient on Abilify 5 mg daily -Continue Abilify  CHF Per chart review CHF is listed in the patient's problem list but I am unable to find records of CHF diagnosis. There are no available echo results indicating whether he has HFpEF or HFrEF. There are also no appointments visible with cardiology. Patient does have crackles in lung bases on exam but given  he has missed 2  dialysis appointments this is most likely the cause.Current heart failure medications include Bumex 2 mg twice daily on all days not at dialysis, carvedilol 12.5 mg daily. -We will monitor fluid status -Reassess after dialysis appointment -Cardiac echo ordered due to bacteremia, pending 7/1  GERD Home medications includePepcid and Protonix -Continue home Protonix - Stop Pepcid  Polysubstance abuse Per chart review patient has history of positive UDS for cocaine and THC. -Social work consult -Monitor for signs of withdrawal  Hx ofHomelessness Per chart review patient has history of homelessness. Patient reports that he has not homeless at this time and that he is moving back to [facility] soon. Patient does acknowledge that he missed his dialysis appointment because his ride to the dialysis appointment fell through. -Social work consulted for confirmation that patient has stable living situation  Buttocks wound Patient reports that he was prescribed doxycycline for this and that he is taking it. He reports that it was drained by the emergency room doctors twice. On exam it does not appear infected it appears to be healing well. Of note the prescriptions for the doxycycline were not picked up from the pharmacy. -Wound care has been consulted, appreciate recommendations -Wet to dry -Holding antibiotics at this time FEN/GI:  Renal diet with fluid restriction PPx: Heparin  Disposition: Discharge will be at least Friday.  Patient will take IV holiday starting today 06/13/2020, port will be reestablished planning for Friday.  Subjective:  Patient reports that he continues to have pain at the site of port. He request tramadol after his procedure. Patient would like "something stronger than tramadol" for his pain.  Patient is aware of the limited avable pain medications due to ESRD, but reports that his kidneys are "already messed up," and would like to try other medications.  Patient  would also like to switch his pregabalin to gabapentin for his neuropathy. He reports that he has done better on gabapentin in the past.   Objective: Temp:  [97.6 F (36.4 C)-98.6 F (37 C)] 98.2 F (36.8 C) (07/01 0440) Pulse Rate:  [85-88] 85 (07/01 0440) Resp:  [18-19] 19 (07/01 0440) BP: (118-134)/(64-95) 134/89 (07/01 0440) SpO2:  [98 %-100 %] 100 % (07/01 0440) Weight:  [134.1 kg] 134.1 kg (07/01 0440) Physical Exam: General: Patient is lying in bed. Pain around port site with movement. Cardiovascular: s3 Murmur heard on auscultation with regular rate. Respiratory: No noted extra work of breathing, Equal breath sounds to bilateral ausc. Abdomen: Pain to palpation in the RLQ around site of previously dx hernia. Extremities: RLE clean bandage noted on BKA. Bilateral BKA. Skin: Port site bandage cleans.  Laboratory: Recent Labs  Lab 06/10/20 0729 06/12/20 0644 06/13/20 0433  WBC 12.4* 16.1* 14.5*  HGB 7.5* 7.7* 7.8*  HCT 24.3* 25.3* 25.3*  PLT 292 240 250   Recent Labs  Lab 06/11/20 0631 06/12/20 0644 06/13/20 0433  NA 133* 130* 131*  K 3.5 3.6 4.1  CL 99 97* 97*  CO2 22 22 21*  BUN 41* 32* 43*  CREATININE 8.22* 6.38* 7.55*  CALCIUM 8.1* 8.1* 8.2*  GLUCOSE 111* 335* 206*      Imaging/Diagnostic Tests: Awaiting sensitivity of Enterococcus to Daptomycin.  Freida Busman, MD 06/13/2020, 5:55 AM PGY-1, [place] Intern pager: [phone], text pages welcome

## 2020-06-13 NOTE — Consult Note (Addendum)
[address]       [place] [address]             [phone]        Jaelon Mollica Pickens Medical Record #[id number] Date of Birth: [birth year]  Referring: No ref. provider found Primary Care: System, Pcp Not In Primary Cardiologist:No primary care provider on file.  Chief Complaint:    Chief Complaint  Patient presents with  . Hyperglycemia  . Vascular Access Problem   Mitral valve endocarditis on TEE   History of Present Illness:    The patient is a 49 year old male who presented to the hospital on 06/09/2020 to the emergency department.  He was admitted to the family medicine teaching service.  Initial complaint included right-sided chest pain around the region of his port.  Initial presentation showed blood sugars above 600.  He is an insulin-dependent diabetes with multiple other comorbidities including type 2 diabetes mellitus, end-stage renal disease, hyperlipidemia, anemia, congestive heart failure, gastroesophageal reflux disease, polysubstance abuse and social situation is complicated by homelessness.  Reportedly he was recently seen at [facility] in the emergency department and his insulin dosing was adjusted at that time.  He was admitted for further evaluation and management.. Nephrology was consulted to assist with hemodialysis.  He was also noted to have findings of a wound in his buttock region that was previously treated in the emergency room twice and he was placed on doxycycline at that time.  On admission he reportedly did have some complaints of intermittent fevers and chills.  The wound care specialist nurse was consulted for assistance with management of the buttock wound.  Blood culture subsequently revealed enterococcal bacteremia and infectious disease consultation was also obtained.  An echocardiogram was felt to be required to evaluate for endocarditis and this was done showing findings as listed below consistent with mitral valve  endocarditis.  We are asked to see the patient for surgical options as it relates to this finding.  He did have his tunneled dialysis catheter removed on 6/29 and is scheduled for replacement by interventional radiology.    Current Activity/ Functional Status: Limited due to bilateral below-knee amputations.  He does have a left prosthetic limb.   Zubrod Score: At the time of surgery this patient's most appropriate activity status/level should be described as: []     0    Normal activity, no symptoms []     1    Restricted in physical strenuous activity but ambulatory, able to do out light work []     2    Ambulatory and capable of self care, unable to do work activities, up and about                 more than 50%  Of the time                            []     3    Only limited self care, in bed greater than 50% of waking hours []     4    Completely disabled, no self care, confined to bed or chair []     5    Moribund  Past Medical History:  Diagnosis Date  . Atrial fibrillation ([place])   . Diabetes mellitus without complication ([place])   . Renal disorder    end stage    Past Surgical History:  Procedure Laterality Date  . BELOW KNEE  LEG AMPUTATION Bilateral   . IR REMOVAL TUN CV CATH W/O FL  06/11/2020    Social History   Tobacco Use  Smoking Status Never Smoker  Smokeless Tobacco Never Used    Social History   Substance and Sexual Activity  Alcohol Use None     No Known Allergies  Current Facility-Administered Medications  Medication Dose Route Frequency Provider Last Rate Last Admin  . acetaminophen (TYLENOL) tablet 650 mg  650 mg Oral Q6H PRN Croitoru, Mihai, MD   650 mg at 06/10/20 2220  . amiodarone (PACERONE) tablet 200 mg  200 mg Oral Daily Croitoru, Mihai, MD   200 mg at 06/13/20 1126  . amLODipine (NORVASC) tablet 10 mg  10 mg Oral Daily Croitoru, Mihai, MD   10 mg at 06/13/20 1126  . ARIPiprazole (ABILIFY) tablet 5 mg  5 mg Oral Daily Croitoru, Mihai, MD   5 mg at  06/13/20 1128  . atorvastatin (LIPITOR) tablet 40 mg  40 mg Oral Daily Croitoru, Mihai, MD   40 mg at 06/13/20 1128  . carvedilol (COREG) tablet 12.5 mg  12.5 mg Oral BID Croitoru, Mihai, MD   12.5 mg at 06/13/20 1126  . Chlorhexidine Gluconate Cloth 2 % PADS 6 each  6 each Topical Q0600 Croitoru, Mihai, MD   6 each at 06/10/20 1203  . Chlorhexidine Gluconate Cloth 2 % PADS 6 each  6 each Topical Q0600 Croitoru, Mihai, MD   6 each at 06/13/20 0430  . [START ON 06/14/2020] Chlorhexidine Gluconate Cloth 2 % PADS 6 each  6 each Topical Q0600 Claudia Desanctis, MD      . DAPTOmycin (CUBICIN) 850 mg in sodium chloride 0.9 % IVPB  850 mg Intravenous Q48H Michel Bickers, MD      . Derrill Memo ON 06/14/2020] Darbepoetin Alfa (ARANESP) injection 150 mcg  150 mcg Intravenous Q Fri-HD McDiarmid, Blane Ohara, MD      . doxercalciferol (HECTOROL) capsule 4 mcg  4 mcg Oral Q T,Th,Sa-HD Croitoru, Mihai, MD      . heparin injection 5,000 Units  5,000 Units Subcutaneous Q8H Croitoru, Mihai, MD   5,000 Units at 06/12/20 2051  . hydrALAZINE (APRESOLINE) tablet 100 mg  100 mg Oral TID Croitoru, Mihai, MD   100 mg at 06/13/20 1127  . insulin aspart (novoLOG) injection 0-15 Units  0-15 Units Subcutaneous TID WC Croitoru, Mihai, MD   3 Units at 06/13/20 1138  . insulin aspart (novoLOG) injection 0-5 Units  0-5 Units Subcutaneous QHS Croitoru, Mihai, MD   3 Units at 06/12/20 2057  . [START ON 06/14/2020] insulin glargine (LANTUS) injection 25 Units  25 Units Subcutaneous Daily Beard, Samantha N, DO      . insulin glargine (LANTUS) injection 5 Units  5 Units Subcutaneous Once McDiarmid, Blane Ohara, MD      . lidocaine (XYLOCAINE) 1 % (with pres) injection    PRN Soyla Dryer R, NP   20 mL at 06/11/20 1627  . linezolid (ZYVOX) tablet 600 mg  600 mg Oral Q12H Michel Bickers, MD      . mupirocin ointment (BACTROBAN) 2 % 1 application  1 application Nasal BID Croitoru, Mihai, MD   1 application at 73/41/93 2052  . pantoprazole (PROTONIX) EC  tablet 40 mg  40 mg Oral Daily Croitoru, Mihai, MD   40 mg at 06/13/20 1127  . pregabalin (LYRICA) capsule 25 mg  25 mg Oral QHS Croitoru, Mihai, MD   25 mg at 06/12/20 2051  . ramelteon (  ROZEREM) tablet 8 mg  8 mg Oral QHS Croitoru, Mihai, MD   8 mg at 06/12/20 2050  . sevelamer carbonate (RENVELA) tablet 1,600 mg  1,600 mg Oral TID with meals Croitoru, Mihai, MD   1,600 mg at 06/13/20 1144  . tamsulosin (FLOMAX) capsule 0.4 mg  0.4 mg Oral Daily Croitoru, Mihai, MD   0.4 mg at 06/13/20 1127  . traMADol (ULTRAM) tablet 25 mg  25 mg Oral Q12H PRN Croitoru, Mihai, MD   25 mg at 06/12/20 2052    Medications Prior to Admission  Medication Sig Dispense Refill Last Dose  . amiodarone (PACERONE) 200 MG tablet Take 200 mg by mouth daily.   Past Week at Unknown time  . amLODipine (NORVASC) 10 MG tablet Take 10 mg by mouth daily.   Past Week at Unknown time  . ARIPiprazole (ABILIFY) 5 MG tablet Take 5 mg by mouth daily.   Past Week at Unknown time  . atorvastatin (LIPITOR) 40 MG tablet Take 1 tablet (40 mg total) by mouth daily. 30 tablet 0 Past Week at Unknown time  . bumetanide (BUMEX) 2 MG tablet Take 2 mg by mouth See admin instructions. Taking 2 mg twice daily except on HD days, Tues, Thurs, and Saturday not taking.   Past Week at Unknown time  . buPROPion (WELLBUTRIN SR) 150 MG 12 hr tablet Take 150 mg by mouth daily.   Past Week at Unknown time  . carvedilol (COREG) 12.5 MG tablet Take 1 tablet (12.5 mg total) by mouth 2 (two) times daily with a meal. 30 tablet 0 Past Week at 0600 or 1800  . doxycycline (VIBRAMYCIN) 100 MG capsule Take 1 capsule (100 mg total) by mouth 2 (two) times daily. 14 capsule 0 Past Week at Unknown time  . ELIQUIS 5 MG TABS tablet Take 5 mg by mouth 2 (two) times daily.   Past Week at 0600 or 1800  . HUMALOG 100 UNIT/ML injection Inject 2-12 Units into the skin 3 (three) times daily before meals. Per sliding scale:  BG 180-200= 2 units, 201-250= 5 units, 251-300= 7 units,  301-350= 10 units, 351-400 = 12 units, 400 Call MD   Past Week at Unknown time  . hydrALAZINE (APRESOLINE) 100 MG tablet Take 100 mg by mouth 3 (three) times daily.   Past Week at Unknown time  . insulin glargine (LANTUS) 100 UNIT/ML injection Inject 0.2 mLs (20 Units total) into the skin daily. (Patient taking differently: Inject 25 Units into the skin at bedtime. ) 10 mL 11 Past Week at Unknown time  . pantoprazole (PROTONIX) 40 MG tablet Take 40 mg by mouth daily.   Past Week at Unknown time  . pregabalin (LYRICA) 25 MG capsule Take 25 mg by mouth at bedtime.   Past Week at Unknown time  . QUEtiapine (SEROQUEL) 200 MG tablet Take 200 mg by mouth at bedtime.   Past Week at Unknown time  . sevelamer carbonate (RENVELA) 800 MG tablet Take 1,600 mg by mouth 3 (three) times daily.   Past Week at Unknown time  . tamsulosin (FLOMAX) 0.4 MG CAPS capsule Take 0.4 mg by mouth daily.   Past Week at Unknown time  . Insulin Pen Needle 29G X 12.7MM MISC 30 Containers by Does not apply route daily. 30 each 1 Unknown at Unknown time    History reviewed. No pertinent family history.   Review of Systems:   Review of Systems  Constitutional: Positive for chills, diaphoresis, fever, malaise/fatigue and weight  loss.  HENT: Positive for congestion, sinus pain and sore throat. Negative for ear discharge, ear pain, hearing loss, nosebleeds and tinnitus.   Eyes: Positive for blurred vision and double vision. Negative for photophobia, pain, discharge and redness.  Respiratory: Positive for cough, sputum production, shortness of breath and wheezing. Negative for hemoptysis and stridor.   Cardiovascular: Positive for chest pain, palpitations, orthopnea, claudication and leg swelling. Negative for PND.  Gastrointestinal: Positive for abdominal pain, blood in stool, diarrhea, heartburn, nausea and vomiting. Negative for constipation and melena.       Some BRBPR  Genitourinary: Positive for dysuria, frequency and  urgency.  Musculoskeletal: Positive for joint pain, myalgias and neck pain.       Bilat BKA  Skin: Negative for itching and rash.  Neurological: Positive for dizziness, tingling, sensory change, weakness and headaches. Negative for tremors, focal weakness and seizures.  Endo/Heme/Allergies: Negative for environmental allergies and polydipsia. Does not bruise/bleed easily.  Psychiatric/Behavioral: Positive for depression, substance abuse and suicidal ideas. Negative for memory loss. The patient is nervous/anxious and has insomnia.      Physical Exam  HENT:  Nose: No nasal discharge.  Mouth/Throat: Dentition is normal. No dental caries. Oropharynx is clear. Pharynx is normal.  Eyes: Pupils are equal, round, and reactive to light. Conjunctivae are normal.  Neck: Thyroid normal. No JVD present. No neck adenopathy. No thyromegaly present.  Pulmonary/Chest: Breath sounds normal. He has no wheezes. He has no rales. He exhibits no tenderness.  Abdominal: Soft. Bowel sounds are normal. He exhibits no distension and no mass. There is no hepatomegaly. There is no abdominal tenderness.  + umbilical hernia obese  Musculoskeletal:        General: No edema.     Cervical back: Normal range of motion and neck supple.     Comments: bilat BKA, has left prosthetic limb Right BKA incis wound, currently dressed, healing well per patient report  Neurological: He is alert and oriented to person, place, and time.  Skin: Skin is warm and dry. No cyanosis. No jaundice, pallor or plethora. Nails show no clubbing.   Diagnostic Studies & Laboratory data:     Recent Radiology Findings:   ECHO TEE  Result Date: 06/13/2020    TRANSESOPHOGEAL ECHO REPORT   Patient Name:   Bayard Protzman Date of Exam: 06/13/2020 Medical Rec #:  [id number]   Height:       76.0 in Accession #:    [id number]  Weight:       295.6 lb Date of Birth:  01-Oct-1971   BSA:          2.615 m Patient Age:    49 years    BP:           116/70 mmHg Patient  Gender: M           HR:           85 bpm. Exam Location:  Inpatient Procedure: Transesophageal Echo, Color Doppler and Cardiac Doppler Indications:     Bacteremia  History:         Patient has prior history of Echocardiogram examinations, most                  recent 06/11/2020. Mitral Valve Disease and Endocarditis; Risk                  Factors:Diabetes and IV Drug Abuse.  Sonographer:     Dustin Flock Referring Phys:  [id number] Leanor Kail Diagnosing Phys:  Mihai Croitoru MD PROCEDURE: After discussion of the risks and benefits of a TEE, an informed consent was obtained. The transesophogeal probe was passed without difficulty through the esophogus of the patient. Sedation performed by different physician. The patient was monitored while under deep sedation. Anesthestetic sedation was provided intravenously by Anesthesiology: 168.97mg  of Propofol. The patient developed no complications during the procedure. IMPRESSIONS  1. Left ventricular ejection fraction, by estimation, is 45 to 50%. The left ventricle has mildly decreased function. The left ventricle demonstrates global hypokinesis. There is moderate concentric left ventricular hypertrophy.  2. Right ventricular systolic function is normal. The right ventricular size is mildly enlarged. There is mildly elevated pulmonary artery systolic pressure. The estimated right ventricular systolic pressure is 98.9 mmHg.  3. Left atrial size was severely dilated. No left atrial/left atrial appendage thrombus was detected.  4. Right atrial size was severely dilated.  5. Moderate vegetation on the mitral valve.  6. There is a perforation in the posterior mitral leaflet, at the P2-P3 scallop junction. The atrial surface of the perforation is surrounded by the vegetation. While the vegetation is sessile and minimally mobile, it has some small mobile components. Pulmonary vein flow reversal is seen in the right upper pulmonary vein, but there is diastolic dominant  antegrade flow in the left upper pulmonary vein. By PISA, the effective regurgitant orifice area is 0.32 cm sq, regurgitant volume 40 mL. The mitral valve is otherwise normal in structure. Moderate to severe mitral valve regurgitation.  7. Dilated tricuspid annulus. Tricuspid valve regurgitation is severe.  8. The aortic valve is tricuspid. Aortic valve regurgitation is not visualized. No aortic stenosis is present. FINDINGS  Left Ventricle: Left ventricular ejection fraction, by estimation, is 45 to 50%. The left ventricle has mildly decreased function. The left ventricle demonstrates global hypokinesis. The left ventricular internal cavity size was normal in size. There is  moderate concentric left ventricular hypertrophy. Right Ventricle: The right ventricular size is mildly enlarged. No increase in right ventricular wall thickness. Right ventricular systolic function is normal. There is mildly elevated pulmonary artery systolic pressure. The tricuspid regurgitant velocity is 2.80 m/s, and with an assumed right atrial pressure of 8 mmHg, the estimated right ventricular systolic pressure is 21.1 mmHg. Left Atrium: Left atrial size was severely dilated. No left atrial/left atrial appendage thrombus was detected. Right Atrium: Right atrial size was severely dilated. Pericardium: A small pericardial effusion is present. The pericardial effusion is circumferential. Mitral Valve: There is a perforation in the posterior mitral leaflet, at the P2-P3 scallop junction. The atrial surface of the perforation is surrounded by the vegetation. While the vegetation is sessile and minimally mobile, it has some small mobile components. Pulmonary vein flow reversal is seen in the right upper pulmonary vein, but there is diastolic dominant antegrade flow in the left upper pulmonary vein. By PISA, the effective regurgitant orifice area is 0.32 cm sq, regurgitant volume 40 mL. The mitral valve is normal in structure. A moderate  vegetation is seen on the posterior mitral leaflet. The MV vegetation measures 12 mm x 7 mm. Moderate to severe mitral valve regurgitation, with anteriorly-directed jet. Tricuspid Valve: Dilated tricuspid annulus. The tricuspid valve is normal in structure. Tricuspid valve regurgitation is severe. Aortic Valve: The aortic valve is tricuspid. Aortic valve regurgitation is not visualized. No aortic stenosis is present. Pulmonic Valve: The pulmonic valve was normal in structure. Pulmonic valve regurgitation is not visualized. Aorta: The aortic root, ascending aorta, aortic arch and descending aorta are  all structurally normal, with no evidence of dilitation or obstruction. IAS/Shunts: No atrial level shunt detected by color flow Doppler.  MITRAL VALVE                 TRICUSPID VALVE MV VTI:      1.30 m          TR Peak grad:   31.4 mmHg MR Peak grad:    91.6 mmHg   TR Vmax:        280.00 cm/s MR Vmax:         478.58 cm/s MR PISA Nyquist: 0.4 m/s MR PISA:         1.96 cm MR PISA Radius:  0.56 cm Mihai Croitoru MD Electronically signed by Sanda Klein MD Signature Date/Time: 06/13/2020/12:51:54 PM    Final      I have independently reviewed the above radiologic studies and discussed with the patient   Recent Lab Findings: Lab Results  Component Value Date   WBC 14.5 (H) 06/13/2020   HGB 7.8 (L) 06/13/2020   HCT 25.3 (L) 06/13/2020   PLT 250 06/13/2020   GLUCOSE 206 (H) 06/13/2020   ALT 22 05/22/2020   AST 27 05/22/2020   NA 131 (L) 06/13/2020   K 4.1 06/13/2020   CL 97 (L) 06/13/2020   CREATININE 7.55 (H) 06/13/2020   BUN 43 (H) 06/13/2020   CO2 21 (L) 06/13/2020   INR 1.5 (H) 06/09/2020   HGBA1C 11.5 (H) 06/09/2020    Results for orders placed or performed during the hospital encounter of 06/09/20  SARS Coronavirus 2 by RT PCR (hospital order, performed in [facility] hospital lab) Nasopharyngeal Nasopharyngeal Swab     Status: None   Collection Time: 06/09/20  3:11 PM   Specimen:  Nasopharyngeal Swab  Result Value Ref Range Status   SARS Coronavirus 2 NEGATIVE NEGATIVE Final    Comment: (NOTE) SARS-CoV-2 target nucleic acids are NOT DETECTED.  The SARS-CoV-2 RNA is generally detectable in upper and lower respiratory specimens during the acute phase of infection. The lowest concentration of SARS-CoV-2 viral copies this assay can detect is 250 copies / mL. A negative result does not preclude SARS-CoV-2 infection and should not be used as the sole basis for treatment or other patient management decisions.  A negative result may occur with improper specimen collection / handling, submission of specimen other than nasopharyngeal swab, presence of viral mutation(s) within the areas targeted by this assay, and inadequate number of viral copies (<250 copies / mL). A negative result must be combined with clinical observations, patient history, and epidemiological information.  Fact Sheet for Patients:   StrictlyIdeas.no  Fact Sheet for Healthcare Providers: BankingDealers.co.za  This test is not yet approved or  cleared by the [place] FDA and has been authorized for detection and/or diagnosis of SARS-CoV-2 by FDA under an Emergency Use Authorization (EUA).  This EUA will remain in effect (meaning this test can be used) for the duration of the COVID-19 declaration under Section 564(b)(1) of the Act, 21 U.S.C. section 360bbb-3(b)(1), unless the authorization is terminated or revoked sooner.  Performed at [facility], [place] [address] [place], [place] [address]   Culture, blood (single)     Status: Abnormal (Preliminary result)   Collection Time: 06/09/20  9:22 PM   Specimen: BLOOD RIGHT HAND  Result Value Ref Range Status   Specimen Description BLOOD RIGHT HAND  Final   Special Requests   Final    BOTTLES DRAWN  AEROBIC AND ANAEROBIC Blood Culture adequate volume   Culture  Setup Time   Final    GRAM  POSITIVE COCCI IN CLUSTERS AEROBIC BOTTLE ONLY IN BOTH AEROBIC AND ANAEROBIC BOTTLES CRITICAL RESULT CALLED TO, READ BACK BY AND VERIFIED WITH: PHARMD EMILY S [id number] FCP    Culture (A)  Final    ENTEROCOCCUS FAECALIS Sent to [place] for further susceptibility testing. Performed at [facility], [place] [address], [place], [place] [address]    Report Status PENDING  Incomplete   Organism ID, Bacteria ENTEROCOCCUS FAECALIS  Final      Susceptibility   Enterococcus faecalis - MIC*    AMPICILLIN <=2 SENSITIVE Sensitive     VANCOMYCIN 1 SENSITIVE Sensitive     GENTAMICIN SYNERGY RESISTANT Resistant     * ENTEROCOCCUS FAECALIS  Blood Culture ID Panel (Reflexed)     Status: Abnormal   Collection Time: 06/09/20  9:22 PM  Result Value Ref Range Status   Enterococcus species DETECTED (A) NOT DETECTED Final    Comment: CRITICAL RESULT CALLED TO, READ BACK BY AND VERIFIED WITH: PHARMD EMILY S [id number] FCP    Vancomycin resistance NOT DETECTED NOT DETECTED Final   Listeria monocytogenes NOT DETECTED NOT DETECTED Final   Staphylococcus species NOT DETECTED NOT DETECTED Final   Staphylococcus aureus (BCID) NOT DETECTED NOT DETECTED Final   Streptococcus species NOT DETECTED NOT DETECTED Final   Streptococcus agalactiae NOT DETECTED NOT DETECTED Final   Streptococcus pneumoniae NOT DETECTED NOT DETECTED Final   Streptococcus pyogenes NOT DETECTED NOT DETECTED Final   Acinetobacter baumannii NOT DETECTED NOT DETECTED Final   Enterobacteriaceae species NOT DETECTED NOT DETECTED Final   Enterobacter cloacae complex NOT DETECTED NOT DETECTED Final   Escherichia coli NOT DETECTED NOT DETECTED Final   Klebsiella oxytoca NOT DETECTED NOT DETECTED Final   Klebsiella pneumoniae NOT DETECTED NOT DETECTED Final   Proteus species NOT DETECTED NOT DETECTED Final   Serratia marcescens NOT DETECTED NOT DETECTED Final   Haemophilus influenzae NOT DETECTED NOT DETECTED Final   Neisseria  meningitidis NOT DETECTED NOT DETECTED Final   Pseudomonas aeruginosa NOT DETECTED NOT DETECTED Final   Candida albicans NOT DETECTED NOT DETECTED Final   Candida glabrata NOT DETECTED NOT DETECTED Final   Candida krusei NOT DETECTED NOT DETECTED Final   Candida parapsilosis NOT DETECTED NOT DETECTED Final   Candida tropicalis NOT DETECTED NOT DETECTED Final    Comment: Performed at [facility], [address] [place], [place] [address]  MRSA PCR Screening     Status: Abnormal   Collection Time: 06/09/20 11:29 PM   Specimen: Nasal Mucosa; Nasopharyngeal  Result Value Ref Range Status   MRSA by PCR POSITIVE (A) NEGATIVE Final    Comment:        The GeneXpert MRSA Assay (FDA approved for NASAL specimens only), is one component of a comprehensive MRSA colonization surveillance program. It is not intended to diagnose MRSA infection nor to guide or monitor treatment for MRSA infections. RESULT CALLED TO, READ BACK BY AND VERIFIED WITH: ISAACS,T RN 0104 06/10/2020 MITCHELL,L Performed at [facility], [place] [address] [place], [place] [address]   Culture, blood (single)     Status: Abnormal   Collection Time: 06/10/20  1:25 PM   Specimen: BLOOD  Result Value Ref Range Status   Specimen Description BLOOD HEMODIALYSIS CATHETER  Final   Special Requests   Final    BOTTLES DRAWN AEROBIC AND ANAEROBIC Blood  Culture results may not be optimal due to an excessive volume of blood received in culture bottles   Culture  Setup Time   Final    IN BOTH AEROBIC AND ANAEROBIC BOTTLES GRAM POSITIVE COCCI CRITICAL VALUE NOTED.  VALUE IS CONSISTENT WITH PREVIOUSLY REPORTED AND CALLED VALUE.    Culture (A)  Final    ENTEROCOCCUS FAECALIS SUSCEPTIBILITIES PERFORMED ON PREVIOUS CULTURE WITHIN THE LAST 5 DAYS. Performed at [facility], [place] [address], [place], [place] [address]    Report Status 06/13/2020 FINAL  Final  Culture, blood (routine x 2)     Status: None (Preliminary  result)   Collection Time: 06/12/20  6:44 AM   Specimen: BLOOD LEFT HAND  Result Value Ref Range Status   Specimen Description BLOOD LEFT HAND  Final   Special Requests   Final    BOTTLES DRAWN AEROBIC ONLY Blood Culture adequate volume   Culture   Final    NO GROWTH < 24 HOURS Performed at [facility], [place] [address], [place], [place] [address]    Report Status PENDING  Incomplete  Culture, blood (routine x 2)     Status: None (Preliminary result)   Collection Time: 06/12/20  6:49 AM   Specimen: BLOOD  Result Value Ref Range Status   Specimen Description BLOOD SITE NOT SPECIFIED  Final   Special Requests   Final    BOTTLES DRAWN AEROBIC ONLY Blood Culture adequate volume   Culture   Final    NO GROWTH < 24 HOURS Performed at [facility], [place] [address] [address] [place], [place] [address]    Report Status PENDING  Incomplete    Assessment / Plan: Mitral valve endocarditis.  TEE will be reviewed by Dr. Kipp Brood to determine whether surgical intervention versus long-term intravenous antibiotics is the best way to move forward.     End-stage renal disease Insulin-dependent diabetes mellitus.  Type II Atrial fibrillation Bilateral below-knee amputations Hypertension Bipolar disorder Congestive heart failure Polysubstance abuse History of homelessness although he does report he currently has a place to stay.  I  spent 40 minutes counseling the patient face to face.   John Giovanni, PA-C 06/13/2020 4:44 PM  This is a 49 year old male who was admitted with mitral valve endocarditis with moderate to severe mitral valve regurgitation due to posterior leaflet perforation.  He also has a history of end-stage renal disease and was noted to have a vegetation on his dialysis catheter.  He has undergone bilateral amputations and has had a chronic wound on his right lower extremity, and also has a decubitus ulcer which is currently being debrided and treated by wound care.  He has  moderate tricuspid valve regurgitation, normal RV and LV function.  There is a history of polysubstance abuse but he denies any IV drug use, he states that he only uses marijuana and occasionally cocaine.  He presents a very difficult surgical challenge and his ability to rehab given his bilateral amputations.  Additionally recovery would also be difficult given his social situation.  I do not think that surgery is a good option for him right now and agree that with this should be reassessed following completion of his antibiotic therapy.  His most recent blood cultures have been negative.  If he is to undergo surgery sometime in the future, I think a minimally invasive option would offer him the best chance to to recover from this.  Once he has completed his antibiotic therapy  he can follow-up with me in clinic to discuss further options.  Shristi Scheib Bary Leriche

## 2020-06-13 NOTE — Op Note (Signed)
INDICATIONS: infective endocarditis  PROCEDURE:   Informed consent was obtained prior to the procedure. The risks, benefits and alternatives for the procedure were discussed and the patient comprehended these risks.  Risks include, but are not limited to, cough, sore throat, vomiting, nausea, somnolence, esophageal and stomach trauma or perforation, bleeding, low blood pressure, aspiration, pneumonia, infection, trauma to the teeth and death.    After a procedural time-out, the oropharynx was anesthetized with 20% benzocaine spray.   During this procedure the patient was administered IV propofol by Anesthesiology, Dr. Therisa Doyne.  The transesophageal probe was inserted in the esophagus and stomach without difficulty and multiple views were obtained.  The patient was kept under observation until the patient left the procedure room.  The patient left the procedure room in stable condition.   Agitated microbubble saline contrast was not administered.  COMPLICATIONS:    There were no immediate complications.  FINDINGS:  Mildly depressed left ventricular systolic function. Dilated right ventricle with preserved systolic function. Severe biatrial dilation. Medium sized, mostly fixed, broad based vegetation on the posterior mitral leaflet, at the P2-P3 junction. There is a small component of the vegetation which is more mobile. The posterior leaflet is perforated in the center of the vegetation. There is moderate to severe mitral insufficiency through the perforation (flow reversal in the RUPV, not in the LUPV). The tricuspid valve does not have vegetations, but there is severe tricuspid regurgitation. No vegetations are seen on the aortic or pulmonic valves.  RECOMMENDATIONS:     Repeat evaluation for MR severity after completion of antibiotic therapy.  Time Spent Directly with the Patient:  45 minutes   Lucas [name] 06/13/2020, 10:34 AM

## 2020-06-13 NOTE — Progress Notes (Signed)
Patient ID: Lucas [name], male   DOB: 23-Nov-1971, 49 y.o.   MRN: [id number] Pt scheduled for placement of nontunneled HD cath on 7/2. Hx ESRD with enterococcal bacteremia, removal of tunneled HD cath on 6/29. Plans d/w pt with his understanding and consent.

## 2020-06-13 NOTE — Progress Notes (Signed)
*  PRELIMINARY RESULTS* Echocardiogram Echocardiogram Transesophageal has been performed.  Lucas [name] 06/13/2020, 10:44 AM

## 2020-06-14 ENCOUNTER — Inpatient Hospital Stay (HOSPITAL_COMMUNITY): Payer: Medicaid Other

## 2020-06-14 ENCOUNTER — Encounter (HOSPITAL_COMMUNITY): Payer: Self-pay | Admitting: Cardiovascular Disease

## 2020-06-14 DIAGNOSIS — T80219A Unspecified infection due to central venous catheter, initial encounter: Secondary | ICD-10-CM

## 2020-06-14 DIAGNOSIS — E1129 Type 2 diabetes mellitus with other diabetic kidney complication: Secondary | ICD-10-CM | POA: Diagnosis present

## 2020-06-14 HISTORY — PX: IR FLUORO GUIDE CV LINE LEFT: IMG2282

## 2020-06-14 HISTORY — PX: IR US GUIDE VASC ACCESS LEFT: IMG2389

## 2020-06-14 LAB — BASIC METABOLIC PANEL
Anion gap: 13 (ref 5–15)
BUN: 52 mg/dL — ABNORMAL HIGH (ref 6–20)
CO2: 21 mmol/L — ABNORMAL LOW (ref 22–32)
Calcium: 8.3 mg/dL — ABNORMAL LOW (ref 8.9–10.3)
Chloride: 96 mmol/L — ABNORMAL LOW (ref 98–111)
Creatinine, Ser: 8.45 mg/dL — ABNORMAL HIGH (ref 0.61–1.24)
GFR calc Af Amer: 8 mL/min — ABNORMAL LOW (ref >60)
GFR calc non Af Amer: 7 mL/min — ABNORMAL LOW (ref >60)
Glucose, Bld: 206 mg/dL — ABNORMAL HIGH (ref 70–99)
Potassium: 4.2 mmol/L (ref 3.5–5.1)
Sodium: 130 mmol/L — ABNORMAL LOW (ref 135–145)

## 2020-06-14 LAB — PROTIME-INR
INR: 1.3 — ABNORMAL HIGH (ref 0.8–1.2)
Prothrombin Time: 16 seconds — ABNORMAL HIGH (ref 11.4–15.2)

## 2020-06-14 LAB — CBC
HCT: 24.7 % — ABNORMAL LOW (ref 39.0–52.0)
Hemoglobin: 7.7 g/dL — ABNORMAL LOW (ref 13.0–17.0)
MCH: 28.5 pg (ref 26.0–34.0)
MCHC: 31.2 g/dL (ref 30.0–36.0)
MCV: 91.5 fL (ref 80.0–100.0)
Platelets: 261 10*3/uL (ref 150–400)
RBC: 2.7 MIL/uL — ABNORMAL LOW (ref 4.22–5.81)
RDW: 17.1 % — ABNORMAL HIGH (ref 11.5–15.5)
WBC: 12.8 10*3/uL — ABNORMAL HIGH (ref 4.0–10.5)
nRBC: 0 % (ref 0.0–0.2)

## 2020-06-14 LAB — GLUCOSE, CAPILLARY
Glucose-Capillary: 165 mg/dL — ABNORMAL HIGH (ref 70–99)
Glucose-Capillary: 140 mg/dL — ABNORMAL HIGH (ref 70–99)
Glucose-Capillary: 220 mg/dL — ABNORMAL HIGH (ref 70–99)
Glucose-Capillary: 238 mg/dL — ABNORMAL HIGH (ref 70–99)

## 2020-06-14 LAB — CK: Total CK: 66 U/L (ref 49–397)

## 2020-06-14 MED ORDER — TRAMADOL HCL 50 MG PO TABS
ORAL_TABLET | ORAL | Status: AC
  Filled 2020-06-14: qty 1

## 2020-06-14 MED ORDER — LIDOCAINE HCL (PF) 1 % IJ SOLN
INTRAMUSCULAR | Status: AC | PRN
  Administered 2020-06-14: 5 mL

## 2020-06-14 MED ORDER — CYCLOBENZAPRINE HCL 5 MG PO TABS
5.0000 mg | ORAL_TABLET | Freq: Three times a day (TID) | ORAL | Status: DC | PRN
  Administered 2020-06-17 – 2020-07-02 (×12): 5 mg via ORAL
  Filled 2020-06-14 (×12): qty 1

## 2020-06-14 MED ORDER — GABAPENTIN 100 MG PO CAPS
100.0000 mg | ORAL_CAPSULE | ORAL | Status: DC
  Administered 2020-06-15 – 2020-06-22 (×4): 100 mg via ORAL
  Filled 2020-06-14 (×4): qty 1

## 2020-06-14 MED ORDER — HEPARIN SODIUM (PORCINE) 1000 UNIT/ML IJ SOLN
INTRAMUSCULAR | Status: AC
  Administered 2020-06-14: 1000 [IU]
  Filled 2020-06-14: qty 4

## 2020-06-14 MED ORDER — MELATONIN 5 MG PO TABS
5.0000 mg | ORAL_TABLET | Freq: Every day | ORAL | Status: DC
  Administered 2020-06-14 – 2020-07-06 (×22): 5 mg via ORAL
  Filled 2020-06-14 (×23): qty 1

## 2020-06-14 MED ORDER — LIDOCAINE HCL 1 % IJ SOLN
INTRAMUSCULAR | Status: AC
  Filled 2020-06-14: qty 20

## 2020-06-14 MED ORDER — CHLORHEXIDINE GLUCONATE CLOTH 2 % EX PADS
6.0000 | MEDICATED_PAD | Freq: Every day | CUTANEOUS | Status: DC
  Administered 2020-06-15 – 2020-07-07 (×16): 6 via TOPICAL

## 2020-06-14 MED ORDER — DARBEPOETIN ALFA 150 MCG/0.3ML IJ SOSY
PREFILLED_SYRINGE | INTRAMUSCULAR | Status: AC
  Filled 2020-06-14: qty 0.3

## 2020-06-14 MED ORDER — HEPARIN SODIUM (PORCINE) 1000 UNIT/ML IJ SOLN
INTRAMUSCULAR | Status: AC
  Filled 2020-06-14: qty 1

## 2020-06-14 NOTE — Progress Notes (Signed)
Patient ID: Lucas [name], male   DOB: Oct 16, 1971, 49 y.o.   MRN: [id number]         [facility] for Infectious Disease  Date of Admission:  06/09/2020   Total days of antibiotics 5        Day 2 daptomycin        Day 2 linezolid  ASSESSMENT: Mr. Schreck is improving on therapy for enterococcal bacteremia complicated by mitral valve endocarditis.  His isolate is resistant to gentamicin so we have settled on a second line regimen of IV daptomycin dosed after hemodialysis and oral linezolid.  PLAN: 1. Continue IV daptomycin for 6 weeks through 07/22/2020 2. Continue oral linezolid for 4 weeks 3. He will follow-up with me on 07/08/2020.  I will sign off now  Diagnosis: Bacteremia and mitral valve endocarditis  Culture Result: Enterococcus  No Known Allergies  OPAT Orders Discharge antibiotics to be given via PICC line Discharge antibiotics: Per pharmacy protocol and oral linezolid  Duration: 6 weeks daptomycin, 4 weeks of linezolid End Date: 07/22/2020  [facility] Care Per Protocol:  Home health RN for IV administration and teaching; PICC line care and labs.    Labs weekly while on IV antibiotics: _x_ CBC with differential _x_ BMP __ CMP __ CRP __ ESR __ Vancomycin trough _x_ CK  _NA_ Please pull PIC at completion of IV antibiotics __ Please leave PIC in place until doctor has seen patient or been notified  Fax weekly labs to [phone]  Clinic Follow Up Appt: 07/08/2020  Principal Problem:   Central line infection Active Problems:   Bacteremia due to Enterococcus   Endocarditis of mitral valve   Hyperglycemia due to diabetes mellitus ([place])   Anemia associated with stage 5 chronic renal failure (HCC)   End stage renal disease (HCC)   DM (diabetes mellitus), type 2 with renal complications ([place])   Scheduled Meds: . amiodarone  200 mg Oral Daily  . amLODipine  10 mg Oral Daily  . ARIPiprazole  5 mg Oral Daily  . atorvastatin  40 mg Oral Daily  . carvedilol   12.5 mg Oral BID  . Chlorhexidine Gluconate Cloth  6 each Topical Q0600  . darbepoetin (ARANESP) injection - DIALYSIS  150 mcg Intravenous Q Fri-HD  . doxercalciferol  4 mcg Oral Q T,Th,Sa-HD  . [START ON 06/15/2020] gabapentin  100 mg Oral Q T,Th,Sat-1800  . heparin injection (subcutaneous)  5,000 Units Subcutaneous Q8H  . heparin sodium (porcine)      . hydrALAZINE  100 mg Oral TID  . insulin aspart  0-15 Units Subcutaneous TID WC  . insulin aspart  0-5 Units Subcutaneous QHS  . insulin glargine  25 Units Subcutaneous Daily  . lidocaine      . linezolid  600 mg Oral Q12H  . mupirocin ointment  1 application Nasal BID  . pantoprazole  40 mg Oral Daily  . ramelteon  8 mg Oral QHS  . sevelamer carbonate  1,600 mg Oral TID with meals  . tamsulosin  0.4 mg Oral Daily   Continuous Infusions: . DAPTOmycin (CUBICIN)  IV 850 mg (06/13/20 2238)   PRN Meds:.acetaminophen, cyclobenzaprine, lidocaine (PF), lidocaine, traMADol   SUBJECTIVE: He is feeling better.  He tells me that no surgery is planned at this time for his mitral valve endocarditis.  Review of Systems: Review of Systems  Constitutional: Negative for chills, diaphoresis and fever.  Respiratory: Negative for cough and shortness of breath.   Cardiovascular: Negative for chest pain.  Gastrointestinal: Negative for abdominal pain, diarrhea, nausea and vomiting.    No Known Allergies  OBJECTIVE: Vitals:   06/13/20 1628 06/13/20 2106 06/14/20 0523 06/14/20 1150  BP: 124/82 111/75 134/86 (!) 125/93  Pulse: 79 84 86 87  Resp: 18 18 18 18   Temp: 98.2 F (36.8 C) 98.4 F (36.9 C) 98 F (36.7 C)   TempSrc: Oral Oral Oral   SpO2: 100% 98% 91% 97%  Weight:      Height:       Body mass index is 35.99 kg/m.  Physical Exam Constitutional:      Comments: He is pleasant and in no distress.  Cardiovascular:     Rate and Rhythm: Normal rate and regular rhythm.     Heart sounds: No murmur heard.      Comments: Distant heart  sounds. Pulmonary:     Effort: Pulmonary effort is normal.     Breath sounds: Normal breath sounds.  Psychiatric:        Mood and Affect: Mood normal.     Lab Results Lab Results  Component Value Date   WBC 12.8 (H) 06/14/2020   HGB 7.7 (L) 06/14/2020   HCT 24.7 (L) 06/14/2020   MCV 91.5 06/14/2020   PLT 261 06/14/2020    Lab Results  Component Value Date   CREATININE 8.45 (H) 06/14/2020   BUN 52 (H) 06/14/2020   NA 130 (L) 06/14/2020   K 4.2 06/14/2020   CL 96 (L) 06/14/2020   CO2 21 (L) 06/14/2020    Lab Results  Component Value Date   ALT 22 05/22/2020   AST 27 05/22/2020   ALKPHOS 243 (H) 05/22/2020   BILITOT 0.5 05/22/2020     Microbiology: Recent Results (from the past 240 hour(s))  SARS Coronavirus 2 by RT PCR (hospital order, performed in [place] hospital lab) Nasopharyngeal Nasopharyngeal Swab     Status: None   Collection Time: 06/09/20  3:11 PM   Specimen: Nasopharyngeal Swab  Result Value Ref Range Status   SARS Coronavirus 2 NEGATIVE NEGATIVE Final    Comment: (NOTE) SARS-CoV-2 target nucleic acids are NOT DETECTED.  The SARS-CoV-2 RNA is generally detectable in upper and lower respiratory specimens during the acute phase of infection. The lowest concentration of SARS-CoV-2 viral copies this assay can detect is 250 copies / mL. A negative result does not preclude SARS-CoV-2 infection and should not be used as the sole basis for treatment or other patient management decisions.  A negative result may occur with improper specimen collection / handling, submission of specimen other than nasopharyngeal swab, presence of viral mutation(s) within the areas targeted by this assay, and inadequate number of viral copies (<250 copies / mL). A negative result must be combined with clinical observations, patient history, and epidemiological information.  Fact Sheet for Patients:   StrictlyIdeas.no  Fact Sheet for Healthcare  Providers: BankingDealers.co.za  This test is not yet approved or  cleared by the [place] FDA and has been authorized for detection and/or diagnosis of SARS-CoV-2 by FDA under an Emergency Use Authorization (EUA).  This EUA will remain in effect (meaning this test can be used) for the duration of the COVID-19 declaration under Section 564(b)(1) of the Act, 21 U.S.C. section 360bbb-3(b)(1), unless the authorization is terminated or revoked sooner.  Performed at [facility], [place] [address], [place],  [address]   Culture, blood (single)     Status: Abnormal (Preliminary result)   Collection Time: 06/09/20  9:22  PM   Specimen: BLOOD RIGHT HAND  Result Value Ref Range Status   Specimen Description BLOOD RIGHT HAND  Final   Special Requests   Final    BOTTLES DRAWN AEROBIC AND ANAEROBIC Blood Culture adequate volume   Culture  Setup Time   Final    GRAM POSITIVE COCCI IN CLUSTERS AEROBIC BOTTLE ONLY IN BOTH AEROBIC AND ANAEROBIC BOTTLES CRITICAL RESULT CALLED TO, READ BACK BY AND VERIFIED WITH: PHARMD Wolverine S [id number] FCP    Culture (A)  Final    ENTEROCOCCUS FAECALIS Sent to [facility] for further susceptibility testing. Performed at [facility], [place] [address] [place], [place] [address]    Report Status PENDING  Incomplete   Organism ID, Bacteria ENTEROCOCCUS FAECALIS  Final      Susceptibility   Enterococcus faecalis - MIC*    AMPICILLIN <=2 SENSITIVE Sensitive     VANCOMYCIN 1 SENSITIVE Sensitive     GENTAMICIN SYNERGY RESISTANT Resistant     * ENTEROCOCCUS FAECALIS  Blood Culture ID Panel (Reflexed)     Status: Abnormal   Collection Time: 06/09/20  9:22 PM  Result Value Ref Range Status   Enterococcus species DETECTED (A) NOT DETECTED Final    Comment: CRITICAL RESULT CALLED TO, READ BACK BY AND VERIFIED WITH: PHARMD EMILY S [id number] FCP    Vancomycin resistance NOT DETECTED NOT DETECTED Final   Listeria monocytogenes  NOT DETECTED NOT DETECTED Final   Staphylococcus species NOT DETECTED NOT DETECTED Final   Staphylococcus aureus (BCID) NOT DETECTED NOT DETECTED Final   Streptococcus species NOT DETECTED NOT DETECTED Final   Streptococcus agalactiae NOT DETECTED NOT DETECTED Final   Streptococcus pneumoniae NOT DETECTED NOT DETECTED Final   Streptococcus pyogenes NOT DETECTED NOT DETECTED Final   Acinetobacter baumannii NOT DETECTED NOT DETECTED Final   Enterobacteriaceae species NOT DETECTED NOT DETECTED Final   Enterobacter cloacae complex NOT DETECTED NOT DETECTED Final   Escherichia coli NOT DETECTED NOT DETECTED Final   Klebsiella oxytoca NOT DETECTED NOT DETECTED Final   Klebsiella pneumoniae NOT DETECTED NOT DETECTED Final   Proteus species NOT DETECTED NOT DETECTED Final   Serratia marcescens NOT DETECTED NOT DETECTED Final   Haemophilus influenzae NOT DETECTED NOT DETECTED Final   Neisseria meningitidis NOT DETECTED NOT DETECTED Final   Pseudomonas aeruginosa NOT DETECTED NOT DETECTED Final   Candida albicans NOT DETECTED NOT DETECTED Final   Candida glabrata NOT DETECTED NOT DETECTED Final   Candida krusei NOT DETECTED NOT DETECTED Final   Candida parapsilosis NOT DETECTED NOT DETECTED Final   Candida tropicalis NOT DETECTED NOT DETECTED Final    Comment: Performed at [facility], [address], [place], [place] [address]  MRSA PCR Screening     Status: Abnormal   Collection Time: 06/09/20 11:29 PM   Specimen: Nasal Mucosa; Nasopharyngeal  Result Value Ref Range Status   MRSA by PCR POSITIVE (A) NEGATIVE Final    Comment:        The GeneXpert MRSA Assay (FDA approved for NASAL specimens only), is one component of a comprehensive MRSA colonization surveillance program. It is not intended to diagnose MRSA infection nor to guide or monitor treatment for MRSA infections. RESULT CALLED TO, READ BACK BY AND VERIFIED WITH: ISAACS,T RN 0104 06/10/2020 MITCHELL,L Performed at [facility], [place] [address], [place], [place] [address]   Culture, blood (single)     Status: Abnormal   Collection Time: 06/10/20  1:25 PM  Specimen: BLOOD  Result Value Ref Range Status   Specimen Description BLOOD HEMODIALYSIS CATHETER  Final   Special Requests   Final    BOTTLES DRAWN AEROBIC AND ANAEROBIC Blood Culture results may not be optimal due to an excessive volume of blood received in culture bottles   Culture  Setup Time   Final    IN BOTH AEROBIC AND ANAEROBIC BOTTLES GRAM POSITIVE COCCI CRITICAL VALUE NOTED.  VALUE IS CONSISTENT WITH PREVIOUSLY REPORTED AND CALLED VALUE.    Culture (A)  Final    ENTEROCOCCUS FAECALIS SUSCEPTIBILITIES PERFORMED ON PREVIOUS CULTURE WITHIN THE LAST 5 DAYS. Performed at [facility], [place] [address] [place], [place] [address]    Report Status 06/13/2020 FINAL  Final  Culture, blood (routine x 2)     Status: None (Preliminary result)   Collection Time: 06/12/20  6:44 AM   Specimen: BLOOD LEFT HAND  Result Value Ref Range Status   Specimen Description BLOOD LEFT HAND  Final   Special Requests   Final    BOTTLES DRAWN AEROBIC ONLY Blood Culture adequate volume   Culture   Final    NO GROWTH 2 DAYS Performed at [facility], [place] [address], [place], [place] [address]    Report Status PENDING  Incomplete  Culture, blood (routine x 2)     Status: None (Preliminary result)   Collection Time: 06/12/20  6:49 AM   Specimen: BLOOD  Result Value Ref Range Status   Specimen Description BLOOD SITE NOT SPECIFIED  Final   Special Requests   Final    BOTTLES DRAWN AEROBIC ONLY Blood Culture adequate volume   Culture   Final    NO GROWTH 2 DAYS Performed at [facility], [address] [place], [place] [address]    Report Status PENDING  Incomplete    Michel Bickers, MD [facility] for Infectious [place] [phone] pager   [phone] cell 06/14/2020, 1:12 PM

## 2020-06-14 NOTE — Progress Notes (Signed)
Family Medicine Teaching Service Daily Progress Note Intern Pager: [phone]  Patient name: Lucas [name] Medical record number: [id number] Date of birth: 1971/09/16 Age: 49 y.o. Gender: male  Primary Care Provider: System, Pcp Not In Consultants: Nephrology and Infectious Disease Code Status: DNR  Pt Overview and Major Events to Date:  6/27-patient admitted for hyperglycemia and port pain 6/28-blood cultures grew Enterococcus 6/29- TTE found mitral valve vegetation 6/29- Removal of right IJ HD catheter 7/1- TEE completed  Assessment and Plan: Isam Alfordis a 49 y.o.malepresenting with hyperglycemia and pain in his chest surrounding the right subclavian port which radiates into his neck. PMH is significant foratrial flutter s/p ablation, T2DM, ESRD, hyperlipidemia, anemia, CHF, GERD, polysubstance abuse, homelessness.  Right-sided chest pain around the port Patient initially presented to the emergency department because he was having pain around his port site. He has his port changed every Tuesday and reported that he has never had any issues with it but that he was told to watch out for signs of infection and he was concerned it was getting infected. There is no erythema or edema around the port site. Patient has remained afebrile over the last 24 hours. His port was removed on his right side 6/29 afternoon and blood cultures were collected. Cultures resulted in enterococcus.TTE and TEE showed vegetations on his mitral valve and perforation in the center of the vegetation. Enterococcus was found to be Gentamycin resistant. Patient was NPO ant midnight due to procedure to place catheter for HD 7/2, morning. Patient was complaining of what appears to be unilateral MSK pain on the right side of his neck, pain was worse with movement and no lymph nodes could be appreciated. Skin did not appear red, irritated, nor warm. Discussed with patient limitation in available pain medications due  ESRD. -Nephrology consulted, appreciate recommendations  - Restart HD today, 7/2 -Infectious disease consulted appreciate recommendations - Per Infectious Disease, discontinue vancomycin and start IV daptomycin and oral linezolid to be redosed after HD, patient is not a candidate for outpatient IV abx due to unstable living situation -TEE resulted  -Continue renal diet with fluid restriction - CBTS consulted , recommendations appreciated -Follow-up on repeat blood cultures from after his line was pulled -Tramadol twice daily as needed for pain - Flexeril 5mg  q 8hrs PRN for muscle pain   Type 2 diabetes Patient presented with blood glucoses in the high 600s. He was initially started on insulin drip and on insulin drip was discontinued and he was transitioned to subcutaneous insulin at his home dose of 20 units with moderate sensitivity sliding scale, but due to increased need for SAI was increased to 25 U of LAI. Blood sugars have been high 200s to low 300s. 72  BGL 206 on 7/2. -Diabetes coordinator has evaluated the patient, appreciate recommendations -Continue Lantus 25U daily -Nightly coverage -Continue moderate sensitivity sliding scale insulin -Morning BMPs -Social work consult to ensure has resources for medications  Hypertension Patient's home medications include amlodipine 10 mg daily, hydralazine 100 mg 3 times daily. Blood pressures over the last 24 hours have ranged from 118/64-134/89. -Continue home medications at this time  Atrial flutter s/p ablation Patient's home medication include amiodarone and apixaban -Continue home amiodarone  -Holding apixaban at this time due to removal of PICC -Continuous cardiac monitoring  Hyperlipidemia No recent lipid panel on file. Patient is on atorvastatin 40 mg daily. -Continue statin  Bipolar disorder Patient on Abilify 5 mg daily -Continue Abilify  CHF Per chart review CHF is  listed in the patient's problem list but  I am unable to find records of CHF diagnosis. There are no available echo results indicating whether he has HFpEF or HFrEF. There are also no appointments visible with cardiology. Patient does have crackles in lung bases on exam but given he has missed 2 dialysis appointments this is most likely the cause.Current heart failure medications include Bumex 2 mg twice daily on all days not at dialysis, carvedilol 12.5 mg daily. -We will monitor fluid status -Reassess after dialysis appointment -Cardiac echo completed  GERD Home medications includePepcid and Protonix -Continue home Protonix - Holding Pepcid, patient likely does not need both  Polysubstance abuse Per chart review patient has history of positive UDS for cocaine and THC. -Social work consult -Monitor for signs of withdrawal  Hx ofHomelessness Per chart review patient has history of homelessness. Patient reports that he has not homeless at this time and that he is moving back to [facility] soon. Patient does acknowledge that he missed his dialysis appointment because his ride to the dialysis appointment fell through. -Social work consulted for confirmation that patient has stable living situation  Buttocks wound Patient reports that he was prescribed doxycycline for this and that he is taking it. He reports that it was drained by the emergency room doctors twice. On exam it does not appear infected it appears to be healing well. Of note the prescriptions for the doxycycline were not picked up from the pharmacy. -Wound care has been consulted, appreciate recommendations -Wet to dry -Holding antibiotics at this time   FEN/GI: Renal diet with fluid restriction PPx: Heparin  Disposition: Inpatient  Subjective:  Patient reports that he feels about the same, and that he would like to eat. Patient is aware that he is NPO until his new catheter is placed. Patient reports that he has new pain associated with the right  side of his neck, the pain is worse with touch or movement.  Objective: Temp:  [98 F (36.7 C)-98.8 F (37.1 C)] 98 F (36.7 C) (07/02 0523) Pulse Rate:  [79-86] 86 (07/02 0523) Resp:  [12-23] 18 (07/02 0523) BP: (111-135)/(70-86) 134/86 (07/02 0523) SpO2:  [91 %-100 %] 91 % (07/02 0523) Physical Exam: General: Patient appears not to be in distress Cardiovascular: s3 murmur heard, no arrhythmia, Impressive JVD noted on the right side Respiratory: Clear to asucltation Abdomen: Normoactive bowel sounds, no pain to palaption Extremities: Clean wound under bandage on the RLE, Bilateral BKA (pre hospitalization)  Laboratory: Recent Labs  Lab 06/12/20 0644 06/13/20 0433 06/14/20 0420  WBC 16.1* 14.5* 12.8*  HGB 7.7* 7.8* 7.7*  HCT 25.3* 25.3* 24.7*  PLT 240 250 261   Recent Labs  Lab 06/12/20 0644 06/13/20 0433 06/14/20 0420  NA 130* 131* 130*  K 3.6 4.1 4.2  CL 97* 97* 96*  CO2 22 21* 21*  BUN 32* 43* 52*  CREATININE 6.38* 7.55* 8.45*  CALCIUM 8.1* 8.2* 8.3*  GLUCOSE 335* 206* 206*      Imaging/Diagnostic Tests: 06/13/2020- TEE Findings: Mildly depressed left ventricular systolic function. Dilated right ventricle with preserved systolic function. Severe biatrial dilation. Medium sized, mostly fixed, broad based vegetation on the posterior mitral leaflet, at the P2-P3 junction. There is a small component of the vegetation which is more mobile. The posterior leaflet is perforated in the center of the vegetation. There is moderate to severe mitral insufficiency through the perforation (flow reversal in the RUPV, not in the LUPV). The tricuspid valve does not have  vegetations, but there is severe tricuspid regurgitation. No vegetations are seen on the aortic or pulmonic valves.  Freida Busman, MD 06/14/2020, 6:27 AM PGY-1, [place] Intern pager: [phone], text pages welcome

## 2020-06-14 NOTE — Plan of Care (Signed)
  Problem: Clinical Measurements: Goal: Will remain free from infection Outcome: Progressing   Problem: Pain Managment: Goal: General experience of comfort will improve Outcome: Progressing   

## 2020-06-14 NOTE — Progress Notes (Signed)
Patient asking for code status to be changed to full code.Patient also refusing cardiac monitoring and requesting sleeping pill.Call placed to Dr. Jeani Hawking. New orders received and carried out.

## 2020-06-14 NOTE — Progress Notes (Signed)
[facility] Progress Note  Name: Lucas [name] MRN: [id number] DOB: 1971-10-02   Subjective:  HD SW has met with patient as he is homeless and per charting he has been informed he's not able to get transportation to his prior outpatient unit.  Last had HD on 6/29 with 3.8 kg UF.  Note CT surgery consulted re: endocarditis/vegetation.  He was ordered for aranesp on 7/1 but never got it.    Review of systems: denies nausea or vomiting; has been NPO.   denies shortness of breath.  discomfort around catheter site better    ------- Background on consult:  Lucas [name] is an 49 y.o. male DM HTN CHF GERD Polysubstance abuse HLD aflutter s/ ablation (on amiodarone and apixaban) ESRD TTS in [facility] presenting with pain starting past 24hrs in the right side of chest over port radiating to the neck and worse with palpation or movement. No associated with exertion but he states he's had intermittent fevers/ chills. Denies n/v/ abdominal pain. He also denies headaches, syncopal episodes or sick contacts. He is homeless and recently lost his housing now going to move in with friend [name] in [place]. His last dialysis treatment was on Thursday in [place]. He has recently had drainage of a buttocks abscess x2 and is supposed to be on doxycycline for that wound.    Intake/Output Summary (Last 24 hours) at 06/14/2020 0626 Last data filed at 06/13/2020 2238 Gross per 24 hour  Intake 680 ml  Output 300 ml  Net 380 ml    Vitals:  Vitals:   06/13/20 1059 06/13/20 1628 06/13/20 2106 06/14/20 0523  BP: 116/70 124/82 111/75 134/86  Pulse: 79 79 84 86  Resp: 16 18 18 18   Temp:  98.2 F (36.8 C) 98.4 F (36.9 C) 98 F (36.7 C)  TempSrc:  Oral Oral Oral  SpO2: 100% 100% 98% 91%  Weight:      Height:         Physical Exam:  General adult male in bed in no acute distress  HEENT normocephalic atraumatic extraocular movements intact sclera anicteric Neck supple trachea midline Lungs clear to  auscultation bilaterally normal work of breathing at rest  Heart S1S2 no rub Abdomen soft nontender nondistended Extremities 1+ edema resid limbs bilateral lower extremities Psych normal mood and affect Neuro - alert and oriented x3 conversant and follows commands Access RIJ tunneled catheter has been removed   Medications reviewed   Labs:  BMP Latest Ref Rng & Units 06/14/2020 06/13/2020 06/12/2020  Glucose 70 - 99 mg/dL 206(H) 206(H) 335(H)  BUN 6 - 20 mg/dL 52(H) 43(H) 32(H)  Creatinine 0.61 - 1.24 mg/dL 8.45(H) 7.55(H) 6.38(H)  Sodium 135 - 145 mmol/L 130(L) 131(L) 130(L)  Potassium 3.5 - 5.1 mmol/L 4.2 4.1 3.6  Chloride 98 - 111 mmol/L 96(L) 97(L) 97(L)  CO2 22 - 32 mmol/L 21(L) 21(L) 22  Calcium 8.9 - 10.3 mg/dL 8.3(L) 8.2(L) 8.1(L)    Outpatient HD orders:   [facility]) Phone [phone] Scheduled TTS 4 hours His outpatient unit reports noncompliance BF 400; DF 800 F250 dialyzer  2/2.5 ca bath EDW 250 lbs Last weight 272.2 lbs on 6/24 (signed off early and had skipped the treatment prior) aranesp 120 mcg weekly  hectoral 4 mcg each tx    Assessment/Plan:   1. Enterococcus bacteremia  - s/p removal of tunneled catheter on 6/29 for line holiday - on abx per primary team and ID - would  hold eliquis 5 mg BID for now given need for replacing dialysis catheter   2. Mitral valve endocarditis  - mitral valve vegetation noted on TTE on 6/29  3 ESRD: - Last last HD on 6/29 per TTS schedule.  Tunneled catheter was removed for line holiday for bacteremia  - Consulted IR for replacement of dialysis catheter on 7/2 with IR - nontunneled for now      - HD today 7/2 and then transition back to TTS schedule  - He has been informed that he has to go to dialysis at his home unit ([facility] in [place]) and if he wishes to change to a different unit they will need to get him set up. HD SW is working on transportation issue identified  in getting to his home clinic  4. Anemia of ESRD:  For ESA weekly (was on Thursdays).  Have increased aranesp to 150 mcg every thursday. He never got his 7/1 dose here - I have asked RN to give  5. Metabolic Bone Disease:continue sevelamer. Continue hectorol.  Phos in AM  6. Hypertension:  - acceptable on current regimen   7DM type 2 - per primary team   8. buttock abscess: Status post drainage in the ER x2 previously; is supposed to be on doxycycline per charting.  Per primary team. Needs to follow-up with wound carein [place].   Disposition - continue inpatient monitoring  Claudia Desanctis, MD 06/14/2020 6:43 AM

## 2020-06-14 NOTE — Procedures (Signed)
Interventional Radiology Procedure Note  Procedure: US guided left IJ temp HD tri-alysis.  41UL   Complications: None Recommendations:  - Ok to use - Do not submerge - Routine line care   Signed,  Dulcy Fanny. Earleen Newport, DO

## 2020-06-15 DIAGNOSIS — Z794 Long term (current) use of insulin: Secondary | ICD-10-CM

## 2020-06-15 DIAGNOSIS — E1122 Type 2 diabetes mellitus with diabetic chronic kidney disease: Secondary | ICD-10-CM

## 2020-06-15 LAB — BASIC METABOLIC PANEL
Anion gap: 11 (ref 5–15)
BUN: 41 mg/dL — ABNORMAL HIGH (ref 6–20)
CO2: 24 mmol/L (ref 22–32)
Calcium: 8.2 mg/dL — ABNORMAL LOW (ref 8.9–10.3)
Chloride: 96 mmol/L — ABNORMAL LOW (ref 98–111)
Creatinine, Ser: 7.23 mg/dL — ABNORMAL HIGH (ref 0.61–1.24)
GFR calc Af Amer: 9 mL/min — ABNORMAL LOW (ref >60)
GFR calc non Af Amer: 8 mL/min — ABNORMAL LOW (ref >60)
Glucose, Bld: 171 mg/dL — ABNORMAL HIGH (ref 70–99)
Potassium: 3.7 mmol/L (ref 3.5–5.1)
Sodium: 131 mmol/L — ABNORMAL LOW (ref 135–145)

## 2020-06-15 LAB — CBC
HCT: 24.5 % — ABNORMAL LOW (ref 39.0–52.0)
Hemoglobin: 7.4 g/dL — ABNORMAL LOW (ref 13.0–17.0)
MCH: 28.5 pg (ref 26.0–34.0)
MCHC: 30.2 g/dL (ref 30.0–36.0)
MCV: 94.2 fL (ref 80.0–100.0)
Platelets: 242 10*3/uL (ref 150–400)
RBC: 2.6 MIL/uL — ABNORMAL LOW (ref 4.22–5.81)
RDW: 17.1 % — ABNORMAL HIGH (ref 11.5–15.5)
WBC: 10 10*3/uL (ref 4.0–10.5)
nRBC: 0 % (ref 0.0–0.2)

## 2020-06-15 LAB — GLUCOSE, CAPILLARY
Glucose-Capillary: 285 mg/dL — ABNORMAL HIGH (ref 70–99)
Glucose-Capillary: 155 mg/dL — ABNORMAL HIGH (ref 70–99)
Glucose-Capillary: 245 mg/dL — ABNORMAL HIGH (ref 70–99)
Glucose-Capillary: 339 mg/dL — ABNORMAL HIGH (ref 70–99)

## 2020-06-15 LAB — PHOSPHORUS: Phosphorus: 4.8 mg/dL — ABNORMAL HIGH (ref 2.5–4.6)

## 2020-06-15 MED ORDER — HEPARIN SODIUM (PORCINE) 1000 UNIT/ML IJ SOLN
INTRAMUSCULAR | Status: AC
  Administered 2020-06-15: 3000 [IU]
  Filled 2020-06-15: qty 3

## 2020-06-15 MED ORDER — SODIUM CHLORIDE 0.9% FLUSH
10.0000 mL | Freq: Two times a day (BID) | INTRAVENOUS | Status: DC
  Administered 2020-06-20 – 2020-07-04 (×6): 10 mL

## 2020-06-15 MED ORDER — DOXERCALCIFEROL 4 MCG/2ML IV SOLN
INTRAVENOUS | Status: AC
  Filled 2020-06-15: qty 2

## 2020-06-15 MED ORDER — SODIUM CHLORIDE 0.9% FLUSH
10.0000 mL | INTRAVENOUS | Status: DC | PRN
  Administered 2020-06-15: 10 mL

## 2020-06-15 MED ORDER — DARBEPOETIN ALFA 150 MCG/0.3ML IJ SOSY: 150.0000 ug | PREFILLED_SYRINGE | INTRAMUSCULAR | Status: DC

## 2020-06-15 NOTE — Progress Notes (Signed)
[facility] Progress Note  Name: Lucas [name] MRN: [id number] DOB: March 06, 1971   Subjective:  Had nontunneled catheter with IR on 7/2.  Last had HD on 7/2 with 4 kg UF.  States he's been told they are working on setting up transportation to HD.  Has been on 3 liters oxygen   Review of systems: denies nausea or vomiting Reports some shortness of breath intermittently.  discomfort around catheter site better but still there  ------- Background on consult:  Lucas [name] is an 49 y.o. male DM HTN CHF GERD Polysubstance abuse HLD aflutter s/ ablation (on amiodarone and apixaban) ESRD TTS in [facility] presenting with pain starting past 24hrs in the right side of chest over port radiating to the neck and worse with palpation or movement. No associated with exertion but he states he's had intermittent fevers/ chills. Denies n/v/ abdominal pain. He also denies headaches, syncopal episodes or sick contacts. He is homeless and recently lost his housing now going to move in with friend [name] in [place]. His last dialysis treatment was on Thursday in [place]. He has recently had drainage of a buttocks abscess x2 and is supposed to be on doxycycline for that wound.    Intake/Output Summary (Last 24 hours) at 06/15/2020 0745 Last data filed at 06/15/2020 0542 Gross per 24 hour  Intake 365 ml  Output 4012 ml  Net -3647 ml    Vitals:  Vitals:   06/14/20 2100 06/14/20 2120 06/14/20 2137 06/15/20 0521  BP: (!) 137/95 127/77 139/81 (!) 149/95  Pulse:  95 91 96  Resp:  18 20 20   Temp:  98.1 F (36.7 C) 98.7 F (37.1 C) 98 F (36.7 C)  TempSrc:  Oral Oral Oral  SpO2:  93% 97% 100%  Weight:  130.8 kg 132.9 kg   Height:         Physical Exam:   General adult male in bed in no acute distress  HEENT normocephalic atraumatic extraocular movements intact sclera anicteric Neck supple trachea midline Lungs clear to auscultation bilaterally normal work of breathing at rest  Heart S1S2 no  rub Abdomen soft nontender nondistended Extremities 1+ edema resid limbs bilateral lower extremities Psych normal mood and affect Neuro - alert and oriented x3 conversant and follows commands Access left IJ nontunneled HD catheter  Medications reviewed   Labs:  BMP Latest Ref Rng & Units 06/14/2020 06/13/2020 06/12/2020  Glucose 70 - 99 mg/dL 206(H) 206(H) 335(H)  BUN 6 - 20 mg/dL 52(H) 43(H) 32(H)  Creatinine 0.61 - 1.24 mg/dL 8.45(H) 7.55(H) 6.38(H)  Sodium 135 - 145 mmol/L 130(L) 131(L) 130(L)  Potassium 3.5 - 5.1 mmol/L 4.2 4.1 3.6  Chloride 98 - 111 mmol/L 96(L) 97(L) 97(L)  CO2 22 - 32 mmol/L 21(L) 21(L) 22  Calcium 8.9 - 10.3 mg/dL 8.3(L) 8.2(L) 8.1(L)    Outpatient HD orders:   [facility]) Phone [phone] Scheduled TTS 4 hours His outpatient unit reports noncompliance BF 400; DF 800 F250 dialyzer  2/2.5 ca bath EDW 250 lbs Last weight 272.2 lbs on 6/24 (signed off early and had skipped the treatment prior) aranesp 120 mcg weekly  hectoral 4 mcg each tx    Assessment/Plan:   1. Enterococcus bacteremia  - s/p removal of tunneled catheter on 6/29 for line holiday - blood cultures 6/30 NGTD - on abx per primary team and ID - would hold eliquis 5 mg BID for now given need for replacing dialysis catheter  2. Mitral valve endocarditis  - mitral valve vegetation noted on TTE on 6/29 - abx [name]  3 ESRD: - Last last HD on 6/29 per TTS schedule.  Tunneled catheter was removed for line holiday for bacteremia.  nontunneled catheter placed with IR on 7/2 - Back on HD per TTS schedule  - Will need tunneled catheter replaced on 7/5 if blood cultures remain negative  - He has been informed that he has to go to dialysis at his home unit ([facility] unit in [place]) and if he wishes to change to a different unit they will need to get him set up. HD SW is working on transportation issue identified in getting to his home clinic  4.  Anemia of ESRD:  Have increased aranesp to 150 mcg every Thursday; got off-schedule on 7/2 after missing 7/1.  retimed for dosing every Thursday  5. Metabolic Bone Disease:Continue hectorol.  Phos acceptable on sevelamer  6. Hypertension:  - acceptable on current regimen; UF with HD   7DM type 2 - per primary team   8. buttock abscess: Status post drainage in the ER x2 previously; is supposed to be on doxycycline per charting.  Per primary team. Needs to follow-up with wound carein [place].   Disposition - continue inpatient monitoring. Endocarditis - on IV abx.  From a renal standpoint will need tunneled catheter replaced and needs to confirm has transportation to/from dialysis unit prior to discharge  Claudia Desanctis, MD 06/15/2020 8:03 AM

## 2020-06-15 NOTE — Progress Notes (Signed)
Family Medicine Teaching Service Daily Progress Note Intern Pager: [phone]  Patient name: Lucas [name] Medical record number: [id number] Date of birth: 1971/03/30 Age: 49 y.o. Gender: male  Primary Care Provider: System, Pcp Not In Consultants: Nephrology, Infectious Disease Code Status: FULL  Pt Overview and Major Events to Date:  6/27-patient admitted for hyperglycemia and port pain 6/28-blood cultures grew Enterococcus 6/29- TTE found mitral valve vegetation 6/29- Removal of right IJ HD catheter 7/1- TEE completed 7/2- Patient received Left IJ nontunneled, restarted HD  Antibiotics Gentamicin 6/29-6/30 Vancomycin 6/28-6/29 Linezolid 7/1-8/9 Daptomycin 7/1-7/28  Assessment and Plan: Lucas [name] a 49 y.o.malewith Enterococcus endocarditis. PMH is significant foratrial flutter s/p ablation, T2DM, ESRD, hyperlipidemia, anemia, CHF, GERD, polysubstance abuse, homelessness.  Enterococcus endocarditis-stable Lucas [name] has been compliant with his treatment so far.  He recognizes that he may be in the hospital for an extended period and is requested that there may be an allowance for him to spend some time outside.  CVTS noted that he is not a good surgical candidate due to his significant comorbidities and current social situation.  They advised following up in clinic following completion of his antibiotic course. -Nephrology following, appreciate recommendations  -Infectious disease sign off, 7/3, will f/u with patient 7/26 -Linezolid day 4/42 -Daptomycin day 4/28 -weekly labs: CBC w/ diff, BMP, CK -Follow-up on repeat blood cultures from after his line was pulled  Infected dialysis catheter Following a line holiday, and new left IJ catheter was placed on 7/2.  There is no safe for him to receive dialysis through this new catheter.  This is very likely the cause of his significant right upper chest pain on presentation. -Continue to monitor catheter site -Back on TTS  schedule for dialysis -Planning for tunneled catheter placement on 7/5 if blood cultures remain negative -Tramadol twice daily as needed for pain -Flexeril 5mg  q 8hrs PRN for muscle pain  Right chest/neck pain Lucas [name] has been noting some right chest/neck pain since his admission.  He notes that it is potentially worsened compared to admission and seems to interfere slightly with swallowing.  On exam today, his right clavicle seemed particularly tender to palpation.  This pain seems to be worse when he tries to move around to lift himself around the better off of the bed. -Continue to monitor -Consider CT abdomen for further assessment  Right forearm swelling He reports that his right forearm has been swollen since he had his right IJ removed, about 5 days ago.  It does not seem to be bothering him at all.  He assumed that it would improve with dialysis because he thought it was likely from volume overload but his recent dialysis session does not seem to have made any difference.  No focal tenderness suspicious for cellulitis.  Possible thrombus in the right upper extremity.  He has been off of his Eliquis due to changing out his dialysis catheters. -Follow-up upper extremity Doppler  Type 2 diabetes Blood glucose 155-414 in the past 24 hours.  25 units glargine, 17 units aspart given the past 24 hours. -Lantus 25U daily -SSI moderate -At bedtime coverage  Hypertension -Amlodipine 10 -Hydralazine 100 3 times daily  Atrial flutter s/p ablation Patient's home medication include amiodarone and apixaban -amiodarone  -Holding apixaban at this time due to removal of PICC -Continuous cardiac monitoring  Hyperlipidemia -Atorvastatin 40 mg  Bipolar disorder -Abilify 5 mg daily  CHF -Monitor fluid status -Dialysis TTS  GERD -Protonix  Polysubstance abuse We will ensure that  he is accompanied if he is allotted time outside.  Hx ofHomelessness Per chart review patient  has history of homelessness. Patient reports that he has not homeless at this time and that he is moving back to [facility] soon. Patient does acknowledge that he missed his dialysis appointment because his ride to the dialysis appointment fell through. -Social work consulted for confirmation that patient has stable living situation  Buttocks wound Monitor as signs or symptoms require  FEN/GI: Renal diet with fluid restriction PPx: Heparin  Disposition: Inpatient  Subjective:  No acute events overnight.  This morning, he feels generally well but notes that he continues to have this right upper chest and right neck pain.  He notes that he has some pain with swallowing him as told his head never a specific position to have less pain with swallowing.  Additionally, he has a moderately edematous right forearm which he thought improved with dialysis but does not seem to have improved.  Objective: Temp:  [97.8 F (36.6 C)-98.5 F (36.9 C)] 98.4 F (36.9 C) (07/03 1722) Pulse Rate:  [83-96] 95 (07/03 1722) Resp:  [18-20] 18 (07/03 1722) BP: (127-150)/(68-95) 150/78 (07/03 1722) SpO2:  [96 %-100 %] 96 % (07/03 1722) Weight:  [128.3 kg-133.2 kg] 128.3 kg (07/03 1644) Physical Exam: General: Sitting on the edge of his bed comfortably.  No acute distress. HEENT: Moderate tenderness with palpation of the right SCM.  Severe tenderness with palpation of his right clavicle.  Moderate tenderness around his upper chest.  No obvious lymphadenopathy along his cervical chain or axilla. Cardio: Normal S1 and S2, no S3 or S4. Rhythm is regular. No murmurs or rubs.   Pulm: Clear to auscultation bilaterally, no crackles, wheezing, or diminished breath sounds. Normal respiratory effort Abdomen: Bowel sounds normal. Abdomen soft and non-tender.  Extremities: Significant swelling noted on the right forearm.  No focal tenderness.  No apparent skin changes. Neuro: Cranial nerves grossly  intact   Laboratory: Recent Labs  Lab 06/13/20 0433 06/14/20 0420 06/15/20 0357  WBC 14.5* 12.8* 10.0  HGB 7.8* 7.7* 7.4*  HCT 25.3* 24.7* 24.5*  PLT 250 261 242   Recent Labs  Lab 06/13/20 0433 06/14/20 0420 06/15/20 1252  NA 131* 130* 131*  K 4.1 4.2 3.7  CL 97* 96* 96*  CO2 21* 21* 24  BUN 43* 52* 41*  CREATININE 7.55* 8.45* 7.23*  CALCIUM 8.2* 8.3* 8.2*  GLUCOSE 206* 206* 171*    Imaging/Diagnostic Tests: No results found.   Matilde Haymaker, MD 06/15/2020, 9:54 PM PGY-3, [place] Intern pager: [phone], text pages welcome

## 2020-06-15 NOTE — Progress Notes (Signed)
Family Medicine Teaching Service Daily Progress Note Intern Pager: [phone]  Patient name: Lucas [name] Medical record number: [id number] Date of birth: Sep 27, 1971 Age: 49 y.o. Gender: male  Primary Care Provider: System, Pcp Not In Consultants: Nephrology, Infectious Disease Code Status: FULL  Pt Overview and Major Events to Date:  6/27-patient admitted for hyperglycemia and port pain 6/28-blood cultures grew Enterococcus 6/29- TTE found mitral valve vegetation 6/29- Removal of right IJ HD catheter 7/1- TEE completed 7/2- Patient received Left IJ nontunneled, restarted HD  Assessment and Plan: Lucas Alfordis a 49 y.o.malepresenting with hyperglycemia and pain in his chest surrounding the right subclavian port which radiates into his neck. PMH is significant foratrial flutter s/p ablation, T2DM, ESRD, hyperlipidemia, anemia, CHF, GERD, polysubstance abuse, homelessness.  Right-sided chest pain around the port Patient initially presented to the emergency department because he was having pain around his port site. He has his port changed every Tuesday and reported that he has never had any issues with it but that he was told to watch out for signs of infection and he was concerned it was getting infected. There is no erythema or edema around the port site. Patient has remained afebrile over the last 24 hours. His port was removed on his right side6/29afternoon and blood cultures were collected. Cultures resulted in enterococcus.TTE and TEE showed vegetations on his mitral valve and perforation in the center of the vegetation.Enterococcus was found to be Gentamycin resistant.  Patient continues to report of what appears to be unilateral MSK pain on the right side of his neck, pain was worse with movement. . Skin did not appear red, irritated, nor warm. Patient reports that the pain around his right port site is still bad.  Discussed with patient limitation in available pain medications  due ESRD. Patient reports that he is aware his stay in the hospital is expected be the entire 6 week course of his abx treatment; however, he wants to be able to go outside at some points during his stay.  -Nephrology consulted, appreciate recommendations  - HD out of new site on Left IJD -Infectious disease sign off, 7/3, will f/u with patient 7/26 - Per Infectious Disease, weekly labs: CBC w/ diff, BMP, CK -Continue renal diet with fluid restriction - CBTS consulted , recommendations appreciated -Follow-up on repeat blood cultures from after his line was pulled -Tramadol twice daily as needed for pain - Flexeril 5mg  q 8hrs PRN for muscle pain   Type 2 diabetes Patient presented with blood glucoses in the high 600s. He was initially started on insulin drip and on insulin drip was discontinued and he was transitioned to subcutaneous insulin at his home dose of 20 units with moderate sensitivity sliding scale, but due to increased need for SAI was increased to 25 U of LAI. Blood sugars have been high 200s to low 300s.49  BGL 285 on 7/3. -Diabetes coordinator has evaluated the patient, appreciate recommendations -ContinueLantus 25U daily -Nightly coverage -Continue moderate sensitivity sliding scale insulin -Morning BMPs -Social work consult to ensure has resources for medications  Hypertension Patient's home medications include amlodipine 10 mg daily, hydralazine 100 mg 3 times daily. Blood pressures over the last 24 hours have ranged from 118/64-134/89. -Continue home medications at this time  Atrial flutter s/p ablation Patient's home medication include amiodarone and apixaban -Continue home amiodarone  -Holding apixaban at this time due to removal of PICC -Continuous cardiac monitoring  Hyperlipidemia No recent lipid panel on file. Patient is on atorvastatin 40 mg  daily. -Continue statin  Bipolar disorder Patient on Abilify 5 mg daily -Continue Abilify  CHF Per  chart review CHF is listed in the patient's problem list but I am unable to find records of CHF diagnosis. There are no available echo results indicating whether he has HFpEF or HFrEF. There are also no appointments visible with cardiology. Patient does have crackles in lung bases on exam but given he has missed 2 dialysis appointments this is most likely the cause.Current heart failure medications include Bumex 2 mg twice daily on all days not at dialysis, carvedilol 12.5 mg daily. -We will monitor fluid status -Reassess after dialysis appointment -Cardiac echo completed  GERD Home medications includePepcid and Protonix -Continue home Protonix - Holding Pepcid, patient likely does not need both  Polysubstance abuse Per chart review patient has history of positive UDS for cocaine and THC. -Social work consult -Monitor for signs of withdrawal  Hx ofHomelessness Per chart review patient has history of homelessness. Patient reports that he has not homeless at this time and that he is moving back to [facility] soon. Patient does acknowledge that he missed his dialysis appointment because his ride to the dialysis appointment fell through. -Social work consulted for confirmation that patient has stable living situation  Buttocks wound Patient reports that he was prescribed doxycycline for this and that he is taking it. He reports that it was drained by the emergency room doctors twice. On exam it does not appear infected it appears to be healing well. Of note the prescriptions for the doxycycline were not picked up from the pharmacy. -Will reassess weekly at this time -Wet to dry -Holding antibiotics at this time  FEN/GI: Renal diet with fluid restriction PPx: Heparin  Disposition: Inpatient  Subjective:  Overnight patient changed his code status to FULL. Patient reports that he "feels better," but he is still in pain on the right side of his neck and jaw. He reports that he  would like "something more than tramadol." Patient also reports that he would like to go outside, he reports that if he does not get to go outside soon he will likely leave AMA.  Objective: Temp:  [84 F (28.9 C)-98.7 F (37.1 C)] 97.8 F (36.6 C) (07/03 0857) Pulse Rate:  [79-96] 87 (07/03 0857) Resp:  [16-20] 18 (07/03 0857) BP: (106-149)/(68-95) 133/87 (07/03 0857) SpO2:  [92 %-100 %] 100 % (07/03 0857) Weight:  [130.8 kg-134.8 kg] 132.9 kg (07/02 2137) Physical Exam: General: Patient resting  in bed Cardiovascular: Distant heart sounds Respiratory: No extra work of breathing noted, equal and clear breath sounds on bilateral auscultation Abdomen: Normoactive bowel sounds, no pain to palpation Extremities: Bilateral BKA, clean bandage on right LLE Skin: New port looks clean, not red nor inflammed  Laboratory: Recent Labs  Lab 06/13/20 0433 06/14/20 0420 06/15/20 0357  WBC 14.5* 12.8* 10.0  HGB 7.8* 7.7* 7.4*  HCT 25.3* 24.7* 24.5*  PLT 250 261 242   Recent Labs  Lab 06/12/20 0644 06/13/20 0433 06/14/20 0420  NA 130* 131* 130*  K 3.6 4.1 4.2  CL 97* 97* 96*  CO2 22 21* 21*  BUN 32* 43* 52*  CREATININE 6.38* 7.55* 8.45*  CALCIUM 8.1* 8.2* 8.3*  GLUCOSE 335* 206* 206*      Imaging/Diagnostic Tests:   Freida Busman, MD 06/15/2020, 11:18 AM PGY-1, [place] Intern pager: [phone], text pages welcome

## 2020-06-15 NOTE — Progress Notes (Signed)
Pharmacy Antibiotic Note  Lucas [name] is a 49 y.o. male admitted on 06/09/2020 with enterococcal bacteremia. Now found to have a mitral valve vegetation. Pharmacy has been consulted for Daptomycin dosing along with Zyvox per MD for Gent-R Enterococcal MV IE.    The patient is ESRD - with a temp HD cath placed 7/2 and s/p HD for 3.5 hr BFR 400. Planning HD again 7/3 to get back on TTS schedule. Daptomcyin is currently scheduled q48h - last dose 7/1, next due 7/3. Adjusted body weight ~100 kg.    Plan: - Continue Daptomycin 850 mg IV every 48 hours for now, will consider transition to HD dosing soon  - Continue Zyvox 600 mg po every 12 hours - Will continue to monitor HD tolerance and shedule   Height: 6\' 4"  (193 cm) Weight: 132.9 kg (292 lb 15.9 oz) IBW/kg (Calculated) : 86.8  Temp (24hrs), Avg:95.4 F (35.2 C), Min:84 F (28.9 C), Max:98.7 F (37.1 C)  Recent Labs  Lab 06/10/20 0729 06/11/20 0631 06/12/20 0644 06/13/20 0433 06/14/20 0420 06/15/20 0357  WBC 12.4*  --  16.1* 14.5* 12.8* 10.0  CREATININE 11.15* 8.22* 6.38* 7.55* 8.45*  --     Estimated Creatinine Clearance: 15.9 mL/min (A) (by C-G formula based on SCr of 8.45 mg/dL (H)).    No Known Allergies   Linezolid 7/1>> Dapto 7/1> Vanc 6/28>>7/1 Gent 6/29>> 6/30  7/2 CK 66  6/27 MRSA PCR + 6/27 BCx: Enterococcus faecalis (BCID Enterococcus) (S-amp, vanc) Gent synergy resistant 6/28 BCx: Enterococcus faecalis 6/30 BCx: ngtd   Thank you for allowing pharmacy to be a part of this patient's care.  Alycia Rossetti, PharmD, BCPS Clinical Pharmacist Clinical phone for 06/15/2020: [phone] 06/15/2020 8:16 AM   **Pharmacist phone directory can now be found on [email or web address] ([email or web address]).  Listed under [place].

## 2020-06-15 NOTE — Progress Notes (Signed)
Patient insistent upon going to bathroom to have bowel movement this morning.Staff tried to encourage patient to get to bedside commode but refuses.Patient placed bilateral knee pads on and crawled on floor to bathroom with staff present in room.After getting patient back in bed dressing changed per orders.

## 2020-06-16 ENCOUNTER — Inpatient Hospital Stay (HOSPITAL_COMMUNITY): Payer: Medicaid Other

## 2020-06-16 DIAGNOSIS — M7989 Other specified soft tissue disorders: Secondary | ICD-10-CM

## 2020-06-16 LAB — GLUCOSE, CAPILLARY
Glucose-Capillary: 385 mg/dL — ABNORMAL HIGH (ref 70–99)
Glucose-Capillary: 328 mg/dL — ABNORMAL HIGH (ref 70–99)
Glucose-Capillary: 280 mg/dL — ABNORMAL HIGH (ref 70–99)
Glucose-Capillary: 190 mg/dL — ABNORMAL HIGH (ref 70–99)

## 2020-06-16 LAB — RENAL FUNCTION PANEL
Albumin: 2.3 g/dL — ABNORMAL LOW (ref 3.5–5.0)
Anion gap: 12 (ref 5–15)
BUN: 31 mg/dL — ABNORMAL HIGH (ref 6–20)
CO2: 24 mmol/L (ref 22–32)
Calcium: 8.3 mg/dL — ABNORMAL LOW (ref 8.9–10.3)
Chloride: 95 mmol/L — ABNORMAL LOW (ref 98–111)
Creatinine, Ser: 5.56 mg/dL — ABNORMAL HIGH (ref 0.61–1.24)
GFR calc Af Amer: 13 mL/min — ABNORMAL LOW (ref >60)
GFR calc non Af Amer: 11 mL/min — ABNORMAL LOW (ref >60)
Glucose, Bld: 414 mg/dL — ABNORMAL HIGH (ref 70–99)
Phosphorus: 3.7 mg/dL (ref 2.5–4.6)
Potassium: 4 mmol/L (ref 3.5–5.1)
Sodium: 131 mmol/L — ABNORMAL LOW (ref 135–145)

## 2020-06-16 LAB — MISC LABCORP TEST (SEND OUT): Labcorp test code: 96388

## 2020-06-16 LAB — CULTURE, BLOOD (SINGLE): Special Requests: ADEQUATE

## 2020-06-16 MED ORDER — CHLORHEXIDINE GLUCONATE CLOTH 2 % EX PADS: 6.0000 | MEDICATED_PAD | Freq: Every day | CUTANEOUS | Status: DC

## 2020-06-16 MED ORDER — DICLOFENAC SODIUM 1 % EX GEL
4.0000 g | Freq: Once | CUTANEOUS | Status: AC
  Administered 2020-06-17: 4 g via TOPICAL
  Filled 2020-06-16: qty 100

## 2020-06-16 MED ORDER — LIDOCAINE 5 % EX PTCH
1.0000 | MEDICATED_PATCH | CUTANEOUS | Status: DC
  Administered 2020-06-16 – 2020-07-06 (×15): 1 via TRANSDERMAL
  Filled 2020-06-16 (×18): qty 1

## 2020-06-16 MED ORDER — DARBEPOETIN ALFA 150 MCG/0.3ML IJ SOSY
150.0000 ug | PREFILLED_SYRINGE | INTRAMUSCULAR | Status: DC
  Administered 2020-06-29: 150 ug via INTRAVENOUS
  Filled 2020-06-16 (×2): qty 0.3

## 2020-06-16 MED ORDER — POLYETHYLENE GLYCOL 3350 17 G PO PACK
17.0000 g | PACK | Freq: Every day | ORAL | Status: DC
  Administered 2020-06-19 – 2020-07-06 (×8): 17 g via ORAL
  Filled 2020-06-16 (×16): qty 1

## 2020-06-16 MED ORDER — OXYCODONE HCL 5 MG PO TABS
5.0000 mg | ORAL_TABLET | Freq: Once | ORAL | Status: AC
  Administered 2020-06-17: 5 mg via ORAL
  Filled 2020-06-16: qty 1

## 2020-06-16 NOTE — Progress Notes (Signed)
[facility] Progress Note  Name: Lucas [name] MRN: [id number] DOB: December 13, 1971   Subjective:  Had HD on 7/3 with 4.5 kg UF.  He feels swollen and feels like he needs extra treatment on 7/5 to help get back on track.  We discussed plans for replacing his tunneled catheter.     Review of systems: denies nausea or vomiting  Reports some shortness of breath with exertion; no chest pain - discomfort around old catheter site  ------- Background on consult:  Alaa Eyerman is an 49 y.o. male DM HTN CHF GERD Polysubstance abuse HLD aflutter s/ ablation (on amiodarone and apixaban) ESRD TTS in [facility] presenting with pain starting past 24hrs in the right side of chest over port radiating to the neck and worse with palpation or movement. No associated with exertion but he states he's had intermittent fevers/ chills. Denies n/v/ abdominal pain. He also denies headaches, syncopal episodes or sick contacts. He is homeless and recently lost his housing now going to move in with friend Lucas [name] in [place]. His last dialysis treatment was on Thursday in [place]. He has recently had drainage of a buttocks abscess x2 and is supposed to be on doxycycline for that wound.    Intake/Output Summary (Last 24 hours) at 06/16/2020 0857 Last data filed at 06/16/2020 0534 Gross per 24 hour  Intake 840 ml  Output 4500 ml  Net -3660 ml    Vitals:  Vitals:   06/15/20 1722 06/15/20 2206 06/15/20 2206 06/16/20 0550  BP: (!) 150/78  139/90 (!) 131/92  Pulse: 95  96 91  Resp: 18  18 16   Temp: 98.4 F (36.9 C)  98.1 F (36.7 C) 98.4 F (36.9 C)  TempSrc: Oral  Oral Oral  SpO2: 96%  97% 99%  Weight:  131.9 kg    Height:         Physical Exam:   General adult male in bed in no acute distress  HEENT normocephalic atraumatic extraocular movements intact sclera anicteric Neck supple trachea midline Lungs clear to auscultation bilaterally normal work of breathing at rest  Heart S1S2 no rub Abdomen soft  nontender distended and obese habitus Extremities 1+ edema resid limbs bilateral lower extremities Psych normal mood and affect Neuro - alert and oriented x3 conversant and follows commands Access left IJ nontunneled HD catheter  Medications reviewed   Labs:  BMP Latest Ref Rng & Units 06/16/2020 06/15/2020 06/14/2020  Glucose 70 - 99 mg/dL 414(H) 171(H) 206(H)  BUN 6 - 20 mg/dL 31(H) 41(H) 52(H)  Creatinine 0.61 - 1.24 mg/dL 5.56(H) 7.23(H) 8.45(H)  Sodium 135 - 145 mmol/L 131(L) 131(L) 130(L)  Potassium 3.5 - 5.1 mmol/L 4.0 3.7 4.2  Chloride 98 - 111 mmol/L 95(L) 96(L) 96(L)  CO2 22 - 32 mmol/L 24 24 21(L)  Calcium 8.9 - 10.3 mg/dL 8.3(L) 8.2(L) 8.3(L)    Outpatient HD orders:   [facility]) Phone [phone] Scheduled TTS 4 hours His outpatient unit reports noncompliance BF 400; DF 800 F250 dialyzer  2/2.5 ca bath EDW 250 lbs Last weight 272.2 lbs on 6/24 (signed off early and had skipped the treatment prior) aranesp 120 mcg weekly  hectoral 4 mcg each tx    Assessment/Plan:   1. Enterococcus bacteremia  - s/p removal of tunneled catheter on 6/29 for line holiday - repeat blood cultures 6/30 NGTD - on abx per primary team and ID  2. Mitral valve endocarditis  - mitral valve vegetation noted  on TTE on 6/29 - abx [name]  3 ESRD: - ESRD on HD TTS.  [facility] unit in [place]).  Tunneled catheter was removed for line holiday for bacteremia.  nontunneled catheter placed with IR on 7/2 ------------ - HD per TTS schedule.  Will plan for extra tx on 7/5 for UF with overload.  - Will need tunneled catheter replaced on 7/5 - have ordered IR consult and will make NPO after midnight - would hold eliquis 5 mg BID for now given need for replacing dialysis catheter - then can resume - He has been informed that he has to go to dialysis at his home unit ([facility] in [place]) and if he wishes to change to a different unit  they will need to get him set up. HD SW is working on transportation issue identified in getting to his home clinic  4. Anemia of ESRD:  Have increased aranesp to 150 mcg every Thursday; got off-schedule on 7/2 after missing 7/1.  retimed for dosing every Thursday  5. Metabolic Bone Disease:Continue hectorol.  Phos acceptable on sevelamer   6. Hypertension: - UF with HD to optimize  7DM type 2 - per primary team   8. buttock abscess: Status post drainage in the ER x2 previously; is supposed to be on doxycycline per charting.  Per primary team. Needs to follow-up with wound carein [place].   Disposition - continue inpatient monitoring. Endocarditis - on IV abx.  From a renal standpoint will need tunneled catheter replaced and needs to confirm has transportation to/from dialysis unit prior to discharge  Lucas Desanctis, MD 06/16/2020 9:15 AM

## 2020-06-16 NOTE — Progress Notes (Addendum)
FPTS Interim Progress Note  S: paged for patient's R shoulder pain 10/10. At bedside to evaluate patient. He states it is 10/10 pain, worse with movement, worse than yesterday. No SOB. Not relieved with tylenol or tramadol.   O: BP (!) 153/103 (BP Location: Right Arm)   Pulse 90   Temp 98.8 F (37.1 C) (Oral)   Resp 18   Ht 6\' 4"  (1.93 m)   Wt 131.9 kg   SpO2 100%   BMI 35.40 kg/m   General: patient sleeping when enter room Chest: RRR, no murmur appreciated Pain to palpation of right shoulder, neck, and epigastric area  A/P: Vital signs stable. Non-anginal like symptoms. - treat pain with one time dose oxy IR and topical voltaren gel plus warm compress - bowel regimen added with narcotic use - pulse ox with vitals - reassess in am  Richarda Osmond, DO 06/16/2020, 11:38 PM PGY-3, [facility] pager [phone]

## 2020-06-16 NOTE — Progress Notes (Signed)
Right upper extremity venous duplex has been completed. Preliminary results can be found in CV Proc through chart review.   06/16/20 10:44 AM Lucas [name] RVT

## 2020-06-16 NOTE — Progress Notes (Signed)
Pt is c/o right shoulder, chest, and neck pain at 10/10. Pt has had Tylenol and it is too soon for Tramadol. Pt states that his pain feels worse than before and wants MD to come and assess. RN messaged MD on call to make aware. Awaiting on response.   Eleanora Neighbor, RN

## 2020-06-16 NOTE — Progress Notes (Signed)
Referring Physician(s): Katheren Puller  Supervising Physician: Arne Cleveland  Patient Status:  [facility] - In-pt  Chief Complaint:  Renal failure  Subjective: Patient familiar to IR service from removal right IJ hemodialysis catheter on 06/11/2020 secondary to enterococcal bacteremia and placement of temporary catheter on 06/14/2020.  He has a history of end-stage renal disease along with mitral valve endocarditis, atrial flutter with prior ablation, polysubstance abuse, anemia, metabolic bone disease, CHF, GERD, hypertension, diabetes, buttocks abscess.  He is afebrile, WBC normal, latest blood cultures negative today,  He is on IV daptomycin, and Zyvox.  Request now received from nephrology for replacement of tunneled hemodialysis catheter.  He currently denies fever, headache, abdominal pain, nausea, vomiting or bleeding. He does have some right upper chest/neck discomfort, occasional dyspnea and cough.  Recent right upper extremity duplex negative for DVT.  Past Medical History:  Diagnosis Date  . Atrial fibrillation ([place])   . Diabetes mellitus without complication ([place])   . Renal disorder    end stage   Past Surgical History:  Procedure Laterality Date  . BELOW KNEE LEG AMPUTATION Bilateral   . IR FLUORO GUIDE CV LINE LEFT  06/14/2020  . IR REMOVAL TUN CV CATH W/O FL  06/11/2020  . IR US GUIDE VASC ACCESS LEFT  06/14/2020  . TEE WITHOUT CARDIOVERSION N/A 06/13/2020   Procedure: TRANSESOPHAGEAL ECHOCARDIOGRAM (TEE);  Surgeon: Sanda Klein, MD;  Location: [facility] ENDOSCOPY;  Service: Cardiovascular;  Laterality: N/A;     Allergies: Patient has no known allergies.  Medications: Prior to Admission medications   Medication Sig Start Date End Date Taking? Authorizing Provider  amiodarone (PACERONE) 200 MG tablet Take 200 mg by mouth daily. 04/29/20  Yes [provider]  amLODipine (NORVASC) 10 MG tablet Take 10 mg by mouth daily. 03/25/20  Yes [provider]  ARIPiprazole  (ABILIFY) 5 MG tablet Take 5 mg by mouth daily. 04/16/20  Yes [provider]  atorvastatin (LIPITOR) 40 MG tablet Take 1 tablet (40 mg total) by mouth daily. 05/22/20  Yes Milton Ferguson, MD  bumetanide (BUMEX) 2 MG tablet Take 2 mg by mouth See admin instructions. Taking 2 mg twice daily except on HD days, Tues, Thurs, and Saturday not taking. 04/28/20  Yes [provider]  buPROPion (WELLBUTRIN SR) 150 MG 12 hr tablet Take 150 mg by mouth daily. 03/25/20  Yes [provider]  carvedilol (COREG) 12.5 MG tablet Take 1 tablet (12.5 mg total) by mouth 2 (two) times daily with a meal. 05/22/20  Yes Milton Ferguson, MD  doxycycline (VIBRAMYCIN) 100 MG capsule Take 1 capsule (100 mg total) by mouth 2 (two) times daily. 05/12/20  Yes Lajean Saver, MD  ELIQUIS 5 MG TABS tablet Take 5 mg by mouth 2 (two) times daily. 04/10/20  Yes [provider]  HUMALOG 100 UNIT/ML injection Inject 2-12 Units into the skin 3 (three) times daily before meals. Per sliding scale:  BG 180-200= 2 units, 201-250= 5 units, 251-300= 7 units, 301-350= 10 units, 351-400 = 12 units, 400 Call MD 02/21/20  Yes [provider]  hydrALAZINE (APRESOLINE) 100 MG tablet Take 100 mg by mouth 3 (three) times daily. 04/29/20  Yes [provider]  insulin glargine (LANTUS) 100 UNIT/ML injection Inject 0.2 mLs (20 Units total) into the skin daily. Patient taking differently: Inject 25 Units into the skin at bedtime.  05/22/20  Yes Milton Ferguson, MD  pantoprazole (PROTONIX) 40 MG tablet Take 40 mg by mouth daily. 02/06/20  Yes [provider]  pregabalin (LYRICA) 25 MG capsule Take 25 mg by mouth at bedtime. 04/22/20  Yes [provider]  QUEtiapine (SEROQUEL) 200 MG tablet Take 200 mg by mouth at bedtime. 03/25/20  Yes [provider]  sevelamer carbonate (RENVELA) 800 MG tablet Take 1,600 mg by mouth 3 (three) times daily. 02/06/20  Yes [provider]  tamsulosin (FLOMAX)  0.4 MG CAPS capsule Take 0.4 mg by mouth daily. 03/25/20  Yes [provider]  Insulin Pen Needle 29G X 12.7MM MISC 30 Containers by Does not apply route daily. 05/22/20   Milton Ferguson, MD     Vital Signs: BP (!) 131/92 (BP Location: Left Arm)   Pulse 91   Temp 98.4 F (36.9 C) (Oral)   Resp 16   Ht 6' 4"  (1.93 m)   Wt 290 lb 12.6 oz (131.9 kg)   SpO2 99%   BMI 35.40 kg/m   Physical Exam awake, alert.  Chest with slightly diminished breath sounds bases.  Heart with regular rate/ rhythm.  Abdomen obese, soft, positive bowel sounds, nontender.  Left IJ catheter in place.  Imaging: IR Fluoro Guide CV Line Left  Result Date: 06/14/2020 INDICATION: 49 year old male with a history of renal failure. He has been referred for placement of a temporary hemodialysis catheter EXAM: IMAGE GUIDED TEMPORARY HEMODIALYSIS CATHETER PLACEMENT MEDICATIONS: None ANESTHESIA/SEDATION: None FLUOROSCOPY TIME:  Fluoroscopy Time: 0 minutes 30 seconds (8 mGy). COMPLICATIONS: None PROCEDURE: Informed written consent was obtained from the patient's family after a discussion of the risks, benefits, and alternatives to treatment. Questions regarding the procedure were encouraged and answered. The left neck was prepped with chlorhexidine in a sterile fashion, and a sterile drape was applied covering the operative field. Maximum barrier sterile technique with sterile gowns and gloves were used for the procedure. A timeout was performed prior to the initiation of the procedure. A micropuncture kit was utilized to access the left internal jugular vein under direct, real-time ultrasound guidance after the overlying soft tissues were anesthetized with 1% lidocaine with epinephrine. Ultrasound image documentation was performed. The microwire was kinked to measure appropriate catheter length. A stiff glidewire was advanced to the level of the IVC. A 24 cm triple-lumen hemodialysis catheter was then placed over the wire. Final  catheter positioning was confirmed and documented with a spot radiographic image. The catheter aspirates and flushes normally. The catheter was flushed with appropriate volume heparin dwells. Dressings were applied. The patient tolerated the procedure well without immediate post procedural complication. IMPRESSION: Status post placement of left-sided IJ non tunneled Trialysis hemodialysis catheter. Catheter may be converted if needed. Signed, Dulcy Fanny. Dellia Nims, RPVI [facility] Radiology Electronically Signed   By: Corrie Mckusick D.O.   On: 06/14/2020 12:08   IR US Guide Vasc Access Left  Result Date: 06/14/2020 INDICATION: 49 year old male with a history of renal failure. He has been referred for placement of a temporary hemodialysis catheter EXAM: IMAGE GUIDED TEMPORARY HEMODIALYSIS CATHETER PLACEMENT MEDICATIONS: None ANESTHESIA/SEDATION: None FLUOROSCOPY TIME:  Fluoroscopy Time: 0 minutes 30 seconds (8 mGy). COMPLICATIONS: None PROCEDURE: Informed written consent was obtained from the patient's family after a discussion of the risks, benefits, and alternatives to treatment. Questions regarding the procedure were encouraged and answered. The left neck was prepped with chlorhexidine in a sterile fashion, and a sterile drape was applied covering the operative field. Maximum barrier sterile technique with sterile gowns and gloves were used for the procedure. A timeout was performed  prior to the initiation of the procedure. A micropuncture kit was utilized to access the left internal jugular vein under direct, real-time ultrasound guidance after the overlying soft tissues were anesthetized with 1% lidocaine with epinephrine. Ultrasound image documentation was performed. The microwire was kinked to measure appropriate catheter length. A stiff glidewire was advanced to the level of the IVC. A 24 cm triple-lumen hemodialysis catheter was then placed over the wire.  Final catheter positioning was confirmed and documented with a spot radiographic image. The catheter aspirates and flushes normally. The catheter was flushed with appropriate volume heparin dwells. Dressings were applied. The patient tolerated the procedure well without immediate post procedural complication. IMPRESSION: Status post placement of left-sided IJ non tunneled Trialysis hemodialysis catheter. Catheter may be converted if needed. Signed, Dulcy Fanny. Dellia Nims, RPVI [facility] Radiology Electronically Signed   By: Corrie Mckusick D.O.   On: 06/14/2020 12:08   ECHO TEE  Result Date: 06/13/2020    TRANSESOPHOGEAL ECHO REPORT   Patient Name:   Carmelo Chirino Date of Exam: 06/13/2020 Medical Rec #:  [id number]   Height:       76.0 in Accession #:    [id number]  Weight:       295.6 lb Date of Birth:  1971-05-17   BSA:          2.615 m Patient Age:    57 years    BP:           116/70 mmHg Patient Gender: M           HR:           85 bpm. Exam Location:  Inpatient Procedure: Transesophageal Echo, Color Doppler and Cardiac Doppler Indications:     Bacteremia  History:         Patient has prior history of Echocardiogram examinations, most                  recent 06/11/2020. Mitral Valve Disease and Endocarditis; Risk                  Factors:Diabetes and IV Drug Abuse.  Sonographer:     Dustin Flock Referring Phys:  [id number] Leanor Kail Diagnosing Phys: Sanda Klein MD PROCEDURE: After discussion of the risks and benefits of a TEE, an informed consent was obtained. The transesophogeal probe was passed without difficulty through the esophogus of the patient. Sedation performed by different physician. The patient was monitored while under deep sedation. Anesthestetic sedation was provided intravenously by Anesthesiology: 168.8m of Propofol. The patient developed no complications during the procedure. IMPRESSIONS  1. Left ventricular ejection fraction, by  estimation, is 45 to 50%. The left ventricle has mildly decreased function. The left ventricle demonstrates global hypokinesis. There is moderate concentric left ventricular hypertrophy.  2. Right ventricular systolic function is normal. The right ventricular size is mildly enlarged. There is mildly elevated pulmonary artery systolic pressure. The estimated right ventricular systolic pressure is 330.8mmHg.  3. Left atrial size was severely dilated. No left atrial/left atrial appendage thrombus was detected.  4. Right atrial size was severely dilated.  5. Moderate vegetation on the mitral valve.  6. There is a perforation in the posterior mitral leaflet, at the P2-P3 scallop junction. The atrial surface of the perforation is surrounded by the vegetation. While the vegetation is sessile and minimally mobile, it has some small mobile components. Pulmonary vein flow reversal is seen in the right upper pulmonary vein, but  there is diastolic dominant antegrade flow in the left upper pulmonary vein. By PISA, the effective regurgitant orifice area is 0.32 cm sq, regurgitant volume 40 mL. The mitral valve is otherwise normal in structure. Moderate to severe mitral valve regurgitation.  7. Dilated tricuspid annulus. Tricuspid valve regurgitation is severe.  8. The aortic valve is tricuspid. Aortic valve regurgitation is not visualized. No aortic stenosis is present. FINDINGS  Left Ventricle: Left ventricular ejection fraction, by estimation, is 45 to 50%. The left ventricle has mildly decreased function. The left ventricle demonstrates global hypokinesis. The left ventricular internal cavity size was normal in size. There is  moderate concentric left ventricular hypertrophy. Right Ventricle: The right ventricular size is mildly enlarged. No increase in right ventricular wall thickness. Right ventricular systolic function is normal. There is mildly elevated pulmonary artery systolic pressure. The tricuspid regurgitant velocity  is 2.80 m/s, and with an assumed right atrial pressure of 8 mmHg, the estimated right ventricular systolic pressure is 00.9 mmHg. Left Atrium: Left atrial size was severely dilated. No left atrial/left atrial appendage thrombus was detected. Right Atrium: Right atrial size was severely dilated. Pericardium: A small pericardial effusion is present. The pericardial effusion is circumferential. Mitral Valve: There is a perforation in the posterior mitral leaflet, at the P2-P3 scallop junction. The atrial surface of the perforation is surrounded by the vegetation. While the vegetation is sessile and minimally mobile, it has some small mobile components. Pulmonary vein flow reversal is seen in the right upper pulmonary vein, but there is diastolic dominant antegrade flow in the left upper pulmonary vein. By PISA, the effective regurgitant orifice area is 0.32 cm sq, regurgitant volume 40 mL. The mitral valve is normal in structure. A moderate vegetation is seen on the posterior mitral leaflet. The MV vegetation measures 12 mm x 7 mm. Moderate to severe mitral valve regurgitation, with anteriorly-directed jet. Tricuspid Valve: Dilated tricuspid annulus. The tricuspid valve is normal in structure. Tricuspid valve regurgitation is severe. Aortic Valve: The aortic valve is tricuspid. Aortic valve regurgitation is not visualized. No aortic stenosis is present. Pulmonic Valve: The pulmonic valve was normal in structure. Pulmonic valve regurgitation is not visualized. Aorta: The aortic root, ascending aorta, aortic arch and descending aorta are all structurally normal, with no evidence of dilitation or obstruction. IAS/Shunts: No atrial level shunt detected by color flow Doppler.  MITRAL VALVE                 TRICUSPID VALVE MV VTI:      1.30 m          TR Peak grad:   31.4 mmHg MR Peak grad:    91.6 mmHg   TR Vmax:        280.00 cm/s MR Vmax:         478.58 cm/s MR PISA Nyquist: 0.4 m/s MR PISA:         1.96 cm MR PISA  Radius:  0.56 cm Mihai Croitoru MD Electronically signed by Sanda Klein MD Signature Date/Time: 06/13/2020/12:51:54 PM    Final    VAS [place] UPPER EXTREMITY VENOUS DUPLEX  Result Date: 06/16/2020 UPPER VENOUS STUDY  Indications: Swelling Risk Factors: None identified. Limitations: Body habitus and poor ultrasound/tissue interface. Comparison Study: No prior studies. Performing Technologist: Oliver Hum RVT  Examination Guidelines: A complete evaluation includes B-mode imaging, spectral Doppler, color Doppler, and power Doppler as needed of all accessible portions of each vessel. Bilateral testing is considered an integral part of a complete  examination. Limited examinations for reoccurring indications may be performed as noted.  Right Findings: +----------+------------+---------+-----------+----------+-------+ RIGHT     CompressiblePhasicitySpontaneousPropertiesSummary +----------+------------+---------+-----------+----------+-------+ IJV           Full       Yes       Yes                      +----------+------------+---------+-----------+----------+-------+ Subclavian    Full       Yes       Yes                      +----------+------------+---------+-----------+----------+-------+ Axillary      Full       Yes       Yes                      +----------+------------+---------+-----------+----------+-------+ Brachial      Full       Yes       Yes                      +----------+------------+---------+-----------+----------+-------+ Radial        Full                                          +----------+------------+---------+-----------+----------+-------+ Ulnar         Full                                          +----------+------------+---------+-----------+----------+-------+ Cephalic      Full                                          +----------+------------+---------+-----------+----------+-------+ Basilic       Full                                           +----------+------------+---------+-----------+----------+-------+  Left Findings: +----------+------------+---------+-----------+----------+-------+ LEFT      CompressiblePhasicitySpontaneousPropertiesSummary +----------+------------+---------+-----------+----------+-------+ Subclavian    Full       Yes       Yes                      +----------+------------+---------+-----------+----------+-------+  Summary:  Right: No evidence of deep vein thrombosis in the upper extremity. No evidence of superficial vein thrombosis in the upper extremity.  Left: No evidence of thrombosis in the subclavian.  *See table(s) above for measurements and observations.  Diagnosing physician: Monica Martinez MD Electronically signed by Monica Martinez MD on 06/16/2020 at 1:18:17 PM.    Final     Labs:  CBC: Recent Labs    06/12/20 0644 06/13/20 0433 06/14/20 0420 06/15/20 0357  WBC 16.1* 14.5* 12.8* 10.0  HGB 7.7* 7.8* 7.7* 7.4*  HCT 25.3* 25.3* 24.7* 24.5*  PLT 240 250 261 242    COAGS: Recent Labs    06/09/20 1135 06/14/20 0420  INR 1.5* 1.3*    BMP: Recent Labs    06/13/20 0433 06/14/20 0420 06/15/20 1252 06/16/20 0500  NA 131* 130* 131* 131*  K 4.1 4.2 3.7 4.0  CL 97* 96* 96* 95*  CO2 21* 21* 24 24  GLUCOSE 206* 206* 171* 414*  BUN 43* 52* 41* 31*  CALCIUM 8.2* 8.3* 8.2* 8.3*  CREATININE 7.55* 8.45* 7.23* 5.56*  GFRNONAA 8* 7* 8* 11*  GFRAA 9* 8* 9* 13*    LIVER FUNCTION TESTS: Recent Labs    05/12/20 0614 05/12/20 0614 05/12/20 2105 05/22/20 0720 06/10/20 0729 06/16/20 0500  BILITOT 0.8  --   --  0.5  --   --   AST 21  --   --  27  --   --   ALT 19  --   --  22  --   --   ALKPHOS 178*  --   --  243*  --   --   PROT 6.7  --   --  7.4  --   --   ALBUMIN 2.5*   < > 2.6* 2.7* 2.3* 2.3*   < > = values in this interval not displayed.    Assessment and Plan:  49 yo male with history of end-stage renal disease along with mitral valve  endocarditis, atrial flutter with prior ablation, polysubstance abuse, anemia, metabolic bone disease, CHF, GERD, hypertension, diabetes, buttocks abscess.  He is status post removal of right IJ hemodialysis catheter on 06/11/2020 secondary to enterococcal bacteremia and placement of temporary catheter on 06/14/2020. He is afebrile, WBC normal, latest blood cultures negative today.  He is on IV daptomycin, and Zyvox.  Request now received from nephrology for replacement of tunneled hemodialysis catheter.  Details/risks of procedure, including but not limited to, internal bleeding, infection, injury to adjacent structures discussed with patient with his understanding and consent.  As 7/5 is a holiday for IR staff with on-call/emergent procedures only we will plan procedure on 7/6.   Electronically Signed: D. Rowe Robert, PA-C 06/16/2020, 1:18 PM   I spent a total of 20 minutes at the the patient's bedside AND on the patient's hospital floor or unit, greater than 50% of which was counseling/coordinating care for tunneled hemodialysis catheter placement    Patient ID: Lucas [name], male   DOB: 09-13-1971, 49 y.o.   MRN: [id number]

## 2020-06-17 ENCOUNTER — Inpatient Hospital Stay (HOSPITAL_COMMUNITY): Payer: Medicaid Other

## 2020-06-17 LAB — CBC WITH DIFFERENTIAL/PLATELET
Abs Immature Granulocytes: 0.09 10*3/uL — ABNORMAL HIGH (ref 0.00–0.07)
Basophils Absolute: 0 10*3/uL (ref 0.0–0.1)
Basophils Relative: 0 %
Eosinophils Absolute: 0.1 10*3/uL (ref 0.0–0.5)
Eosinophils Relative: 1 %
HCT: 24.1 % — ABNORMAL LOW (ref 39.0–52.0)
Hemoglobin: 7.3 g/dL — ABNORMAL LOW (ref 13.0–17.0)
Immature Granulocytes: 1 %
Lymphocytes Relative: 8 %
Lymphs Abs: 0.8 10*3/uL (ref 0.7–4.0)
MCH: 28.6 pg (ref 26.0–34.0)
MCHC: 30.3 g/dL (ref 30.0–36.0)
MCV: 94.5 fL (ref 80.0–100.0)
Monocytes Absolute: 1 10*3/uL (ref 0.1–1.0)
Monocytes Relative: 10 %
Neutro Abs: 8.2 10*3/uL — ABNORMAL HIGH (ref 1.7–7.7)
Neutrophils Relative %: 80 %
Platelets: 252 10*3/uL (ref 150–400)
RBC: 2.55 MIL/uL — ABNORMAL LOW (ref 4.22–5.81)
RDW: 17.1 % — ABNORMAL HIGH (ref 11.5–15.5)
WBC: 10.2 10*3/uL (ref 4.0–10.5)
nRBC: 0 % (ref 0.0–0.2)

## 2020-06-17 LAB — CULTURE, BLOOD (ROUTINE X 2)
Culture: NO GROWTH
Culture: NO GROWTH
Special Requests: ADEQUATE
Special Requests: ADEQUATE

## 2020-06-17 LAB — GLUCOSE, CAPILLARY
Glucose-Capillary: 166 mg/dL — ABNORMAL HIGH (ref 70–99)
Glucose-Capillary: 205 mg/dL — ABNORMAL HIGH (ref 70–99)
Glucose-Capillary: 190 mg/dL — ABNORMAL HIGH (ref 70–99)

## 2020-06-17 LAB — CK: Total CK: 78 U/L (ref 49–397)

## 2020-06-17 LAB — BASIC METABOLIC PANEL
Anion gap: 12 (ref 5–15)
BUN: 44 mg/dL — ABNORMAL HIGH (ref 6–20)
CO2: 24 mmol/L (ref 22–32)
Calcium: 8.8 mg/dL — ABNORMAL LOW (ref 8.9–10.3)
Chloride: 95 mmol/L — ABNORMAL LOW (ref 98–111)
Creatinine, Ser: 6.8 mg/dL — ABNORMAL HIGH (ref 0.61–1.24)
GFR calc Af Amer: 10 mL/min — ABNORMAL LOW (ref >60)
GFR calc non Af Amer: 9 mL/min — ABNORMAL LOW (ref >60)
Glucose, Bld: 223 mg/dL — ABNORMAL HIGH (ref 70–99)
Potassium: 4.2 mmol/L (ref 3.5–5.1)
Sodium: 131 mmol/L — ABNORMAL LOW (ref 135–145)

## 2020-06-17 MED ORDER — HEPARIN SODIUM (PORCINE) 5000 UNIT/ML IJ SOLN: 5000.0000 [IU] | Freq: Three times a day (TID) | INTRAMUSCULAR | Status: DC

## 2020-06-17 MED ORDER — DICLOFENAC SODIUM 1 % EX GEL
4.0000 g | Freq: Four times a day (QID) | CUTANEOUS | Status: DC
  Administered 2020-06-17 – 2020-06-30 (×29): 4 g via TOPICAL
  Filled 2020-06-17 (×2): qty 100

## 2020-06-17 NOTE — Evaluation (Signed)
Physical Therapy Evaluation Patient Details Name: Lucas [name] MRN: [id number] DOB: 06/29/1971 Today's Date: 06/17/2020   History of Present Illness  Lucas [name] is a 49 y.o. male with Enterococcus endocarditis. PMH is significant for atrial flutter s/p ablation, T2DM, ESRD, hyperlipidemia, anemia, CHF, GERD, polysubstance abuse, homelessness.  Clinical Impression   Pt admitted with above diagnosis. Comes from home where he lives in a single level house with his housemate; 3 stesp to enter; Uses wheelchair for mobility, and also crawls with knee pads and occasionally tips up on distal end of L residual limb to transfer; Has been fitted for L prosthesis by [facility] in [place]; Presents to PT with R UE pain limiting ability to perform transfers, decr functioanl independence; Pt currently with functional limitations due to the deficits listed below (see PT Problem List). Pt will benefit from skilled PT to increase their independence and safety with mobility to allow discharge to the venue listed below.       Follow Up Recommendations Outpatient PT (Would need to arrange for rides (on non HD days, likely))    Equipment Recommendations  3in1 (PT) (drop-arm BSC; consider RW if we begin prosthesis trng)    Recommendations for Other Services Other (comment) (Prosthetist consult)     Precautions / Restrictions Precautions Precautions: Fall Required Braces or Orthoses: Other Brace Other Brace: Has been fitted for a L prosthesis; reports L prosthesis is still at [facility] in [place]; Pt anticiaptes he will be able to be fitted for R prosthesis soon      Mobility  Bed Mobility Overal bed mobility: Needs Assistance Bed Mobility: Supine to Sit     Supine to sit: Min guard     General bed mobility comments: pulls sefl to long sit with bedrails  Transfers Overall transfer level: Needs assistance   Transfers: Anterior-Posterior Transfer       Anterior-Posterior  transfers: Mod assist;Max assist;+2 physical assistance   General transfer comment: Performed Anterior posterior transfer OOB to recliner and tehn back to bed; Painful RUE made use of R arm to perform scoots difficult, at times elbow-propped instead of hand propped; Good use of bedrails for pull-push to scoot; difficulty with weight shifts to unweigh hips for reciprocal scooting; Required mod/max assist frequently during transfer  Ambulation/Gait                Stairs            Wheelchair Mobility    Modified Rankin (Stroke Patients Only)       Balance                                             Pertinent Vitals/Pain Pain Assessment: Faces Faces Pain Scale: Hurts even more Pain Location: R shoulder/clavicle; limits shoulder ROM Pain Descriptors / Indicators: Grimacing;Guarding Pain Intervention(s): Repositioned    Home Living Family/patient expects to be discharged to:: Private residence Living Arrangements: Non-relatives/Friends Available Help at Discharge: Friend(s) (Housemate, [name]) Type of Home: House Home Access: Stairs to enter   Technical brewer of Steps: 3 Home Layout: One level Home Equipment: Wheelchair - manual Additional Comments: Need more information re: bathroom setup    Prior Function Level of Independence: Needs assistance   Gait / Transfers Assistance Needed: Uses wheelchair for mobility, and going to HD; has knee pads, and sounds like he "walks" on his knees, and occasionally tips  up on the distal end of his L residual limb for transfers  ADL's / [place] Needed: Need more info  Comments: reports he can use a sliding board, though did not indicate he has one     Hand Dominance   Dominant Hand: Left    Extremity/Trunk Assessment   Upper Extremity Assessment Upper Extremity Assessment: Defer to OT evaluation;RUE deficits/detail RUE Deficits / Details: Pain limiting shoulder flexion and abduction  range    Lower Extremity Assessment Lower Extremity Assessment: RLE deficits/detail;LLE deficits/detail RLE Deficits / Details: Distal end of residual limb dressed and wrapped; hip flexor tightness from sitting a lot; pt reports hip extensor weakness as well LLE Deficits / Details: Hip flexor tightness an dhip extensor weakness       Communication   Communication: No difficulties  Cognition Arousal/Alertness: Awake/alert Behavior During Therapy: WFL for tasks assessed/performed Overall Cognitive Status: Within Functional Limits for tasks assessed (for simple mobility)                                        General Comments      Exercises     Assessment/Plan    PT Assessment Patient needs continued PT services  PT Problem List Decreased strength;Decreased range of motion;Decreased activity tolerance;Decreased balance;Decreased mobility;Decreased knowledge of use of DME;Decreased safety awareness;Decreased knowledge of precautions       PT Treatment Interventions DME instruction;Functional mobility training;Therapeutic activities;Therapeutic exercise;Balance training;Patient/family education;Wheelchair mobility training    PT Goals (Current goals can be found in the Care Plan section)  Acute Rehab PT Goals Patient Stated Goal: less pain R UE PT Goal Formulation: With patient Time For Goal Achievement: 07/01/20 Potential to Achieve Goals: Good    Frequency Min 3X/week   Barriers to discharge        Co-evaluation               AM-PAC PT "6 Clicks" Mobility  Outcome Measure Help needed turning from your back to your side while in a flat bed without using bedrails?: None Help needed moving from lying on your back to sitting on the side of a flat bed without using bedrails?: None Help needed moving to and from a bed to a chair (including a wheelchair)?: A Lot Help needed standing up from a chair using your arms (e.g., wheelchair or bedside chair)?:  Total Help needed to walk in hospital room?: Total Help needed climbing 3-5 steps with a railing? : Total 6 Click Score: 13    End of Session Equipment Utilized During Treatment: Other (comment) (bed pad) Activity Tolerance: Patient tolerated treatment well Patient left: in bed;with call bell/phone within reach Nurse Communication: Mobility status PT Visit Diagnosis: Other abnormalities of gait and mobility (R26.89);Muscle weakness (generalized) (M62.81);Pain Pain - Right/Left: Right Pain - part of body: Shoulder    Time: 1115-1150 PT Time Calculation (min) (ACUTE ONLY): 35 min   Charges:   PT Evaluation $PT Eval Moderate Complexity: 1 Mod PT Treatments $Therapeutic Activity: 8-22 mins        Roney [place], PT  Acute Rehabilitation Services Pager [phone] Office [phone]   Colletta [place] 06/17/2020, 7:17 PM

## 2020-06-17 NOTE — Progress Notes (Signed)
OT Cancellation Note  Patient Details Name: Lucas [name] MRN: [id number] DOB: October 28, 1971   Cancelled Treatment:    Reason Eval/Treat Not Completed: Patient declined, no reason specified.  Patient refusing therapy stating he "moves just fine" and is "not getting up".  Patient then ignored therapist and kept eyes closed.  Will attempt again at later time.  August Luz, OTR/L   Phylliss Bob 06/17/2020, 1:01 PM

## 2020-06-17 NOTE — Progress Notes (Signed)
Patient ID: Lucas [name], male   DOB: 1971-09-04, 49 y.o.   MRN: [id number]  [facility] Progress Note    Subjective:   Feels well, no new complaints   Objective:   BP 138/88 (BP Location: Left Arm)   Pulse 77   Temp 97.7 F (36.5 C) (Oral)   Resp 18   Ht 6\' 4"  (1.93 m)   Wt (!) 136.3 kg   SpO2 96%   BMI 36.58 kg/m   Intake/Output: I/O last 3 completed shifts: In: 1240 [P.O.:1240] Out: 0    Intake/Output this shift:  Total I/O In: 565 [P.O.:565] Out: 0  Weight change: 3.1 kg  Physical Exam: Gen: NAD CVS: no rub Resp: cta Abd: +BS, soft, Nt/ND Ext: s/p bilateral BKA's, +2 edema  LIJ untunneled HD catheter  Labs: BMET Recent Labs  Lab 06/11/20 0631 06/12/20 0644 06/13/20 0433 06/14/20 0420 06/15/20 0357 06/15/20 1252 06/16/20 0500 06/17/20 0432  NA 133* 130* 131* 130*  --  131* 131* 131*  K 3.5 3.6 4.1 4.2  --  3.7 4.0 4.2  CL 99 97* 97* 96*  --  96* 95* 95*  CO2 22 22 21* 21*  --  24 24 24   GLUCOSE 111* 335* 206* 206*  --  171* 414* 223*  BUN 41* 32* 43* 52*  --  41* 31* 44*  CREATININE 8.22* 6.38* 7.55* 8.45*  --  7.23* 5.56* 6.80*  ALBUMIN  --   --   --   --   --   --  2.3*  --   CALCIUM 8.1* 8.1* 8.2* 8.3*  --  8.2* 8.3* 8.8*  PHOS  --   --   --   --  4.8*  --  3.7  --    CBC Recent Labs  Lab 06/13/20 0433 06/14/20 0420 06/15/20 0357 06/17/20 0432  WBC 14.5* 12.8* 10.0 10.2  NEUTROABS  --   --   --  8.2*  HGB 7.8* 7.7* 7.4* 7.3*  HCT 25.3* 24.7* 24.5* 24.1*  MCV 92.0 91.5 94.2 94.5  PLT 250 261 242 252      Medications:    . amiodarone  200 mg Oral Daily  . amLODipine  10 mg Oral Daily  . ARIPiprazole  5 mg Oral Daily  . atorvastatin  40 mg Oral Daily  . carvedilol  12.5 mg Oral BID  . Chlorhexidine Gluconate Cloth  6 each Topical Q0600  . Chlorhexidine Gluconate Cloth  6 each Topical Q0600  . [START ON 06/22/2020] darbepoetin (ARANESP) injection - DIALYSIS  150 mcg Intravenous Q Sat-HD  . diclofenac Sodium  4 g  Topical QID  . doxercalciferol  4 mcg Oral Q T,Th,Sa-HD  . gabapentin  100 mg Oral Q T,Th,Sat-1800  . [START ON 06/18/2020] heparin injection (subcutaneous)  5,000 Units Subcutaneous Q8H  . hydrALAZINE  100 mg Oral TID  . insulin aspart  0-15 Units Subcutaneous TID WC  . insulin aspart  0-5 Units Subcutaneous QHS  . insulin glargine  25 Units Subcutaneous Daily  . lidocaine  1 patch Transdermal Q24H  . linezolid  600 mg Oral Q12H  . melatonin  5 mg Oral QHS  . pantoprazole  40 mg Oral Daily  . polyethylene glycol  17 g Oral Daily  . sevelamer carbonate  1,600 mg Oral TID with meals  . sodium chloride flush  10-40 mL Intracatheter Q12H  . tamsulosin  0.4 mg Oral Daily   Outpatient HD orders:   [facility] [place] Phone [phone] Scheduled TTS 4 hours His outpatient unit reports noncompliance BF 400; DF 800 F250 dialyzer  2/2.5 ca bath EDW 250 lbs Last weight 272.2 lbs on 6/24 (signed off early and had skipped the treatment prior) aranesp 120 mcg weekly  hectoral 4 mcg each tx  Assessment/ Plan:   1. Enterococcus bacteremia- due to tunneled HD catheter.  TDC removed 6/29 s/p temp HD catheter placed 06/14/20.  On cubicin and zyvox 2. ESRD continue with TTS schedule and plan for HD after catheter placed by IR on 06/18/20.  3. Mitral valve endocarditis- noted on TTE 6/29.  ID following.  Daptomycin for 6 weeks through 07/22/20, oral linezolid for 4 weeks, follow up with ID on 07/08/20. 4. Anemia: due to ESRD.  Continue with ESA 5. CKD-MBD: continue with home meds 6. Nutrition: renal diet 7. Hypertension: stable 8. Vascular access- will need tunneled HD catheter per IR tomorrow. 9. Disposition- SW to assist with transportation to his outpatient unit.   Donetta Potts, MD [place] Pager [phone] 06/17/2020, 12:17 PM

## 2020-06-17 NOTE — Progress Notes (Signed)
Family Medicine Teaching Service Daily Progress Note Intern Pager: [phone]  Patient name: Lucas [name] Medical record number: [id number] Date of birth: 1971-03-27 Age: 49 y.o. Gender: male  Primary Care Provider: System, Pcp Not In Consultants: Nephrology, Cardiovascular, Infectious DIsease Code Status: FULL  Pt Overview and Major Events to Date:  6/27-patient admitted for hyperglycemia and port pain 6/28-blood cultures grew Enterococcus 6/29- TTE found mitral valve vegetation 6/29- Removal of right IJ HD catheter 7/1- TEE completed 7/2- Patient received Left IJ nontunneled, restarted HD  Antibiotics Gentamicin 6/29-6/30 Vancomycin 6/28-6/29 Linezolid 7/1-8/9 Daptomycin 7/1-7/28  Assessment and Plan: Lucas [name] a 49 y.o.malewith Enterococcus endocarditis. PMH is significant foratrial flutter s/p ablation, T2DM, ESRD, hyperlipidemia, anemia, CHF, GERD, polysubstance abuse, homelessness.  Enterococcus endocarditis-stable Lucas [name] has been compliant with his treatment so far.  He recognizes that he may be in the hospital for an extended period and is requested that there may be an allowance for him to spend some time outside.  CVTS noted that he is not a good surgical candidate due to his significant comorbidities and current social situation.  They advised following up in clinic following completion of his antibiotic course. -Nephrology following, appreciate recommendations -Infectious disease sign off, 7/3, will f/u with patient 7/26 -Linezolid day 5/42 -Daptomycin day 5/28 -weekly labs: CBC w/ diff, BMP, CK -Follow-up on repeat blood cultures from after his line was pulled  Infected dialysis catheter Following a line holiday, and new left IJ catheter was placed on 7/2. Nephrology determined that patient will need extra HD tx on today, 7/5, for UF with overload . Infection is very likely the cause of his significant right upper chest pain on presentation. -Continue to  monitor catheter site -Back on TTS schedule for dialysis, with addn tx scheduled for today -Tunneled catheter placement  Scheduled for, 7/6 -Tramadol twice daily as needed for pain -Flexeril 5mg  q 8hrs PRN for muscle pain  Right chest/neck pain Lucas [name] has been noting some right chest/neck pain since his admission.  He notes that it is potentially worsened compared to admission and seems to interfere slightly with swallowing.  On exam today, his right clavicle seemed mildly tender to palpation, but less that yesterday.  This pain seems to be worse when he tries to move around to lift himself around the better off of the bed and appears to be concentrated to the sternocleidomastoid. Patient required a 1 time dose of oxycodone overnight. Shoulder X- ray was ordered by the night team and did not find acute abnormalities lowering suspicion for osteomyelitis. We will continue to monitor as X ray has a low sensitivity for osteomyelitis and therefore it cannot be completly ruled out. CT could also be used to detect septic joint. -Continue to monitor - Flexeril 5mg  q 8hrs PRN for likely muscle pain  - shoulder XR, completed  - May consider CT of shoulder if pain continues  - Voltaren gel 4x daily scheduled - Continue PT for shoulder  Right forearm swelling He reports that his right forearm has been swollen since he had his right IJ removed, about 5 days ago.  It does not seem to be bothering him at all.  Possible thrombus in the right upper extremity has been ruled out.  He has been off of his Eliquis due to changing out his dialysis catheters. - VENOUS VAS US of the upper extremities found no evidence of DVT in the UE, no evidence of superficial vein thrombosis, no evidence of thrombosis in the subclavian. -  Will continue to monitor post HD session today   Type 2 diabetes Blood glucose 155-414 in the past 24 hours.  25 units glargine, 17 units aspart given the past 24 hours. -Lantus 25U  daily -SSI moderate -At bedtime coverage  Hypertension -Amlodipine 10 -Hydralazine 100 3 times daily  Atrial flutter s/p ablation Patient's home medication include amiodarone and apixaban -amiodarone  -Holding apixaban at this time due to removal of PICC -Continuous cardiac monitoring  Hyperlipidemia -Atorvastatin 40 mg  Bipolar disorder -Abilify 5 mg daily  CHF -Monitor fluid status -Dialysis TTS  GERD -Protonix  Polysubstance abuse We will ensure that he is accompanied when he is allotted time outside. Nurse found patient ingesting "chalk." Bag was still in the room, most of the bag was in [place], but it was determined that the chalk was actually clay, which the patient confirmed. Patient reports that this is a cultural remedy for ingesting Vitamins. Patient was under the impression that the chalk was from [facility] where his family is from and had previously participated in the practice of ingesting chalk for nutrients. The bag has been removed from the patient's room and placed with his possessions.  - Continue to monitor patient  Hx ofHomelessness Per chart review patient has history of homelessness. Patient reports that he has not homeless at this time and that he is moving back to [facility] soon. Patient does acknowledge that he missed his dialysis appointment because his ride to the dialysis appointment fell through. -Social work consulted for confirmation that patient has stable living situation  Buttocks wound Monitor as signs or symptoms require   FEN/GI: Renal diet with fluid restriction PPx: Heparin  Disposition: Inaptient  Subjective:  Overnight the patient was found by the nurse eating clay. The patient reports that he was brought the clay by a friend. The patient reports that he appreciates the few minutes he gets outside. The patient also reports that he feels that he has a bit more fluid on his abdomen and is looking forward to his HD treatment  today.He continues to have R shoulder pain, but it drastically improved with Voltaren gel and he is trying to increase his shoulder range.  Objective: Temp:  [97.7 F (36.5 C)-98.8 F (37.1 C)] 97.7 F (36.5 C) (07/05 0440) Pulse Rate:  [85-90] 85 (07/05 0440) Resp:  [18-20] 18 (07/05 0440) BP: (125-153)/(80-103) 125/87 (07/05 0440) SpO2:  [94 %-100 %] 94 % (07/05 0440) Weight:  [136.3 kg] 136.3 kg (07/05 0440) Physical Exam: General: patient resting comfortably in bed with white clay around his mouth and in the floor. Cardiovascular: No murmur detected today, Regular rate and rythym Respiratory: Clear and equal to bilateral auscultation no increase work of breathing Abdomen: Full, no pain to palpation Extremities: BKA,   Laboratory: Recent Labs  Lab 06/14/20 0420 06/15/20 0357 06/17/20 0432  WBC 12.8* 10.0 10.2  HGB 7.7* 7.4* 7.3*  HCT 24.7* 24.5* 24.1*  PLT 261 242 252   Recent Labs  Lab 06/15/20 1252 06/16/20 0500 06/17/20 0432  NA 131* 131* 131*  K 3.7 4.0 4.2  CL 96* 95* 95*  CO2 24 24 24   BUN 41* 31* 44*  CREATININE 7.23* 5.56* 6.80*  CALCIUM 8.2* 8.3* 8.8*  GLUCOSE 171* 414* 223*      Imaging/Diagnostic Tests: 06/17/2020- VAS [place] UE Venous Duplex Summary:    Right:  No evidence of deep vein thrombosis in the upper extremity. No evidence of  superficial vein thrombosis in the upper extremity.  Left:  No evidence of thrombosis in the subclavian.     Freida Busman, MD 06/17/2020, 6:05 AM PGY-1, [place] Intern pager: [phone], text pages welcome

## 2020-06-17 NOTE — Progress Notes (Signed)
RN went into pt's room at this time to do a dressing change and RN noticed that pt had something white on his mouth and on the floor. It looked like a white powder. RN asked pt what was on his mouth. Pt stated that he has been eating something called "chalk/dirt" from [place] that is supposed to be vitamins and minerals. RN reminded pt that he was told he was supposed to be NPO after midnight several times and that this RN even went in at 2345 and removed all food and drink. Pt stated that he had not been eating it, even though it was all over pt's mouth. RN spoke with MD on call to make aware just in case pt was supposed to have procedure done today. RN will continue to monitor.   Eleanora Neighbor, RN

## 2020-06-17 NOTE — Progress Notes (Signed)
Physician Assistant removed the "dirt" that the patient has been eating from his room and handed it to me the RN. I placed this in a patient belongings bag kept at the nurses station.

## 2020-06-18 ENCOUNTER — Inpatient Hospital Stay (HOSPITAL_COMMUNITY): Payer: Medicaid Other

## 2020-06-18 DIAGNOSIS — M25511 Pain in right shoulder: Secondary | ICD-10-CM

## 2020-06-18 DIAGNOSIS — M25519 Pain in unspecified shoulder: Secondary | ICD-10-CM

## 2020-06-18 HISTORY — PX: IR FLUORO GUIDE CV LINE LEFT: IMG2282

## 2020-06-18 HISTORY — PX: IR US GUIDE VASC ACCESS LEFT: IMG2389

## 2020-06-18 LAB — CBC WITH DIFFERENTIAL/PLATELET
Abs Immature Granulocytes: 0.06 10*3/uL (ref 0.00–0.07)
Basophils Absolute: 0 10*3/uL (ref 0.0–0.1)
Basophils Relative: 0 %
Eosinophils Absolute: 0.1 10*3/uL (ref 0.0–0.5)
Eosinophils Relative: 1 %
HCT: 23.7 % — ABNORMAL LOW (ref 39.0–52.0)
Hemoglobin: 7 g/dL — ABNORMAL LOW (ref 13.0–17.0)
Immature Granulocytes: 1 %
Lymphocytes Relative: 9 %
Lymphs Abs: 0.7 10*3/uL (ref 0.7–4.0)
MCH: 27.5 pg (ref 26.0–34.0)
MCHC: 29.5 g/dL — ABNORMAL LOW (ref 30.0–36.0)
MCV: 92.9 fL (ref 80.0–100.0)
Monocytes Absolute: 0.7 10*3/uL (ref 0.1–1.0)
Monocytes Relative: 10 %
Neutro Abs: 6 10*3/uL (ref 1.7–7.7)
Neutrophils Relative %: 79 %
Platelets: 255 10*3/uL (ref 150–400)
RBC: 2.55 MIL/uL — ABNORMAL LOW (ref 4.22–5.81)
RDW: 17.1 % — ABNORMAL HIGH (ref 11.5–15.5)
WBC: 7.5 10*3/uL (ref 4.0–10.5)
nRBC: 0 % (ref 0.0–0.2)

## 2020-06-18 LAB — GLUCOSE, CAPILLARY
Glucose-Capillary: 207 mg/dL — ABNORMAL HIGH (ref 70–99)
Glucose-Capillary: 288 mg/dL — ABNORMAL HIGH (ref 70–99)
Glucose-Capillary: 135 mg/dL — ABNORMAL HIGH (ref 70–99)
Glucose-Capillary: 164 mg/dL — ABNORMAL HIGH (ref 70–99)
Glucose-Capillary: 242 mg/dL — ABNORMAL HIGH (ref 70–99)

## 2020-06-18 LAB — BASIC METABOLIC PANEL
Anion gap: 13 (ref 5–15)
BUN: 54 mg/dL — ABNORMAL HIGH (ref 6–20)
CO2: 23 mmol/L (ref 22–32)
Calcium: 8.8 mg/dL — ABNORMAL LOW (ref 8.9–10.3)
Chloride: 93 mmol/L — ABNORMAL LOW (ref 98–111)
Creatinine, Ser: 7.69 mg/dL — ABNORMAL HIGH (ref 0.61–1.24)
GFR calc Af Amer: 9 mL/min — ABNORMAL LOW (ref >60)
GFR calc non Af Amer: 8 mL/min — ABNORMAL LOW (ref >60)
Glucose, Bld: 301 mg/dL — ABNORMAL HIGH (ref 70–99)
Potassium: 4.4 mmol/L (ref 3.5–5.1)
Sodium: 129 mmol/L — ABNORMAL LOW (ref 135–145)

## 2020-06-18 LAB — PROTIME-INR
INR: 1.4 — ABNORMAL HIGH (ref 0.8–1.2)
Prothrombin Time: 16.6 seconds — ABNORMAL HIGH (ref 11.4–15.2)

## 2020-06-18 MED ORDER — LIDOCAINE HCL 1 % IJ SOLN
INTRAMUSCULAR | Status: AC
  Filled 2020-06-18: qty 20

## 2020-06-18 MED ORDER — CEFAZOLIN SODIUM-DEXTROSE 2-4 GM/100ML-% IV SOLN
INTRAVENOUS | Status: AC
  Filled 2020-06-18: qty 100

## 2020-06-18 MED ORDER — CHLORHEXIDINE GLUCONATE 4 % EX LIQD
CUTANEOUS | Status: AC
  Filled 2020-06-18: qty 15

## 2020-06-18 MED ORDER — MIDAZOLAM HCL 2 MG/2ML IJ SOLN
INTRAMUSCULAR | Status: AC
  Filled 2020-06-18: qty 2

## 2020-06-18 MED ORDER — MIDAZOLAM HCL 2 MG/2ML IJ SOLN
INTRAMUSCULAR | Status: AC | PRN
  Administered 2020-06-18: 1 mg via INTRAVENOUS
  Administered 2020-06-18: 0.5 mg via INTRAVENOUS

## 2020-06-18 MED ORDER — CEFAZOLIN (ANCEF) 1 G IV SOLR
INTRAVENOUS | Status: AC | PRN
  Administered 2020-06-18: 2 g

## 2020-06-18 MED ORDER — APIXABAN 5 MG PO TABS
5.0000 mg | ORAL_TABLET | Freq: Two times a day (BID) | ORAL | Status: DC
  Administered 2020-06-18 – 2020-07-07 (×37): 5 mg via ORAL
  Filled 2020-06-18 (×37): qty 1

## 2020-06-18 MED ORDER — HEPARIN SODIUM (PORCINE) 1000 UNIT/ML IJ SOLN
INTRAMUSCULAR | Status: AC | PRN
  Administered 2020-06-18: 3800 [IU] via INTRAVENOUS

## 2020-06-18 MED ORDER — HEPARIN SODIUM (PORCINE) 1000 UNIT/ML IJ SOLN
INTRAMUSCULAR | Status: AC
  Administered 2020-06-18: 3000 [IU]
  Filled 2020-06-18: qty 3

## 2020-06-18 MED ORDER — FENTANYL CITRATE (PF) 100 MCG/2ML IJ SOLN
INTRAMUSCULAR | Status: AC | PRN
  Administered 2020-06-18: 25 ug via INTRAVENOUS
  Administered 2020-06-18: 50 ug via INTRAVENOUS

## 2020-06-18 MED ORDER — LIDOCAINE HCL 1 % IJ SOLN
INTRAMUSCULAR | Status: AC | PRN
  Administered 2020-06-18: 10 mL

## 2020-06-18 MED ORDER — HEPARIN SODIUM (PORCINE) 1000 UNIT/ML IJ SOLN
INTRAMUSCULAR | Status: AC
  Filled 2020-06-18: qty 1

## 2020-06-18 MED ORDER — FENTANYL CITRATE (PF) 100 MCG/2ML IJ SOLN
INTRAMUSCULAR | Status: AC
  Filled 2020-06-18: qty 2

## 2020-06-18 NOTE — Progress Notes (Signed)
Patient ID: Lucas [name], male   DOB: Jul 22, 1971, 49 y.o.   MRN: [id number] S: Had dialysis early this am.  Bleeding from [facility] site.  Dr. Vernard Gambles at the bedside to place purse string suture.  Otherwise he feels well. O:BP 129/83 (BP Location: Right Arm)   Pulse 82   Temp 98.5 F (36.9 C) (Oral)   Resp 18   Ht 6\' 4"  (1.93 m)   Wt (!) 139 kg   SpO2 99%   BMI 37.30 kg/m   Intake/Output Summary (Last 24 hours) at 06/18/2020 1348 Last data filed at 06/18/2020 1200 Gross per 24 hour  Intake 2068 ml  Output 4010 ml  Net -1942 ml   Intake/Output: I/O last 3 completed shifts: In: 2441 [P.O.:2441] Out: 4010 [Urine:10; Other:4000]  Intake/Output this shift:  Total I/O In: 200 [P.O.:200] Out: -  Weight change: 2.7 kg Gen: NAD CVS: no rub Resp: cta Abd: benign Ext: s/p bilateral BKA's, trace edema  Recent Labs  Lab 06/12/20 0644 06/13/20 0433 06/14/20 0420 06/15/20 0357 06/15/20 1252 06/16/20 0500 06/17/20 0432 06/18/20 0643  NA 130* 131* 130*  --  131* 131* 131* 129*  K 3.6 4.1 4.2  --  3.7 4.0 4.2 4.4  CL 97* 97* 96*  --  96* 95* 95* 93*  CO2 22 21* 21*  --  24 24 24 23   GLUCOSE 335* 206* 206*  --  171* 414* 223* 301*  BUN 32* 43* 52*  --  41* 31* 44* 54*  CREATININE 6.38* 7.55* 8.45*  --  7.23* 5.56* 6.80* 7.69*  ALBUMIN  --   --   --   --   --  2.3*  --   --   CALCIUM 8.1* 8.2* 8.3*  --  8.2* 8.3* 8.8* 8.8*  PHOS  --   --   --  4.8*  --  3.7  --   --    Liver Function Tests: Recent Labs  Lab 06/16/20 0500  ALBUMIN 2.3*   No results for input(s): LIPASE, AMYLASE in the last 168 hours. No results for input(s): AMMONIA in the last 168 hours. CBC: Recent Labs  Lab 06/13/20 0433 06/13/20 0433 06/14/20 0420 06/14/20 0420 06/15/20 0357 06/17/20 0432 06/18/20 0643  WBC 14.5*   < > 12.8*   < > 10.0 10.2 7.5  NEUTROABS  --   --   --   --   --  8.2* 6.0  HGB 7.8*   < > 7.7*   < > 7.4* 7.3* 7.0*  HCT 25.3*   < > 24.7*   < > 24.5* 24.1* 23.7*  MCV 92.0  --  91.5   --  94.2 94.5 92.9  PLT 250   < > 261   < > 242 252 255   < > = values in this interval not displayed.   Cardiac Enzymes: Recent Labs  Lab 06/14/20 0420 06/17/20 0432  CKTOTAL 66 78   CBG: Recent Labs  Lab 06/17/20 1104 06/17/20 1626 06/17/20 2219 06/18/20 0642 06/18/20 1119  GLUCAP 205* 190* 207* 288* 135*    Iron Studies: No results for input(s): IRON, TIBC, TRANSFERRIN, FERRITIN in the last 72 hours. Studies/Results: DG Shoulder Right  Result Date: 06/17/2020 CLINICAL DATA:  Right shoulder pain for 10 days. EXAM: RIGHT SHOULDER - 2+ VIEW COMPARISON:  None. FINDINGS: No acute bony or joint abnormality is identified. Moderate acromioclavicular osteoarthritis appears advanced for age. The glenohumeral joint is unremarkable. No soft tissue gas or radiopaque  foreign body. Imaged lung parenchyma is clear. Dialysis catheter noted. IMPRESSION: Advanced for age acromioclavicular osteoarthritis. Otherwise negative. Electronically Signed   By: Inge Rise M.D.   On: 06/17/2020 13:06   IR Fluoro Guide CV Line Left  Result Date: 06/18/2020 CLINICAL DATA:  End-stage renal disease on dialysis. Recent removal of infected tunneled right chest hemodialysis catheter with temporary left catheter placed. Long-term dialysis catheter placement requested. EXAM: TUNNELED HEMODIALYSIS CATHETER PLACEMENT WITH ULTRASOUND AND FLUOROSCOPIC GUIDANCE TECHNIQUE: The procedure, risks, benefits, and alternatives were explained to the patient. Questions regarding the procedure were encouraged and answered. The patient understands and consents to the procedure. The patient was already receiving adequate prophylactic antibiotic coverage. Patency of the left IJ vein was confirmed with ultrasound with image documentation. An appropriate skin site was determined. Region was prepped using maximum barrier technique including cap and mask, sterile gown, sterile gloves, large sterile sheet, and Chlorhexidine as cutaneous  antisepsis. The region was infiltrated locally with 1% lidocaine. Intravenous Fentanyl 61mcg and Versed 1.5mg  were administered as conscious sedation during continuous monitoring of the patient's level of consciousness and physiological / cardiorespiratory status by the radiology RN, with a total moderate sedation time of of 21 minutes. under real-time ultrasound guidance, the left IJ vein was accessed with a 21 gauge micropuncture needle above the temp cath; the needle tip within the vein was confirmed with ultrasound image documentation. Needle exchanged over the 018 guidewire for transitional dilator, which allowed advancement of a Benson wire into the IVC. Over this, an MPA catheter was advanced. A Palindrome 23 hemodialysis catheter was tunneled from the left anterior chest wall approach to the left IJ dermatotomy site. The MPA catheter was exchanged over an Amplatz wire for serial vascular dilators which allow placement of a peel-away sheath, through which the catheter was advanced under intermittent fluoroscopy, positioned with its tips in the proximal and midright atrium. Spot chest radiograph confirms good catheter position. No pneumothorax. Catheter was flushed and primed per protocol. Catheter secured externally with O Prolene sutures. The left IJ dermatotomy site was closed with Dermabond. The left temp cath was removed. COMPLICATIONS: COMPLICATIONS None immediate FLUOROSCOPY TIME:  84 seconds; 33 mGy COMPARISON:  06/14/2020 IMPRESSION: 1. Technically successful placement of tunneled left IJ hemodialysis catheter with ultrasound and fluoroscopic guidance. Ready for routine use. ACCESS: Remains approachable for percutaneous intervention as needed. Electronically Signed   By: Lucrezia Europe M.D.   On: 06/18/2020 13:23   IR US Guide Vasc Access Left  Result Date: 06/18/2020 CLINICAL DATA:  End-stage renal disease on dialysis. Recent removal of infected tunneled right chest hemodialysis catheter with  temporary left catheter placed. Long-term dialysis catheter placement requested. EXAM: TUNNELED HEMODIALYSIS CATHETER PLACEMENT WITH ULTRASOUND AND FLUOROSCOPIC GUIDANCE TECHNIQUE: The procedure, risks, benefits, and alternatives were explained to the patient. Questions regarding the procedure were encouraged and answered. The patient understands and consents to the procedure. The patient was already receiving adequate prophylactic antibiotic coverage. Patency of the left IJ vein was confirmed with ultrasound with image documentation. An appropriate skin site was determined. Region was prepped using maximum barrier technique including cap and mask, sterile gown, sterile gloves, large sterile sheet, and Chlorhexidine as cutaneous antisepsis. The region was infiltrated locally with 1% lidocaine. Intravenous Fentanyl 56mcg and Versed 1.5mg  were administered as conscious sedation during continuous monitoring of the patient's level of consciousness and physiological / cardiorespiratory status by the radiology RN, with a total moderate sedation time of of 21 minutes. under real-time ultrasound guidance, the  left IJ vein was accessed with a 21 gauge micropuncture needle above the temp cath; the needle tip within the vein was confirmed with ultrasound image documentation. Needle exchanged over the 018 guidewire for transitional dilator, which allowed advancement of a Benson wire into the IVC. Over this, an MPA catheter was advanced. A Palindrome 23 hemodialysis catheter was tunneled from the left anterior chest wall approach to the left IJ dermatotomy site. The MPA catheter was exchanged over an Amplatz wire for serial vascular dilators which allow placement of a peel-away sheath, through which the catheter was advanced under intermittent fluoroscopy, positioned with its tips in the proximal and midright atrium. Spot chest radiograph confirms good catheter position. No pneumothorax. Catheter was flushed and primed per  protocol. Catheter secured externally with O Prolene sutures. The left IJ dermatotomy site was closed with Dermabond. The left temp cath was removed. COMPLICATIONS: COMPLICATIONS None immediate FLUOROSCOPY TIME:  84 seconds; 33 mGy COMPARISON:  06/14/2020 IMPRESSION: 1. Technically successful placement of tunneled left IJ hemodialysis catheter with ultrasound and fluoroscopic guidance. Ready for routine use. ACCESS: Remains approachable for percutaneous intervention as needed. Electronically Signed   By: Lucrezia Europe M.D.   On: 06/18/2020 13:23   . amiodarone  200 mg Oral Daily  . amLODipine  10 mg Oral Daily  . ARIPiprazole  5 mg Oral Daily  . atorvastatin  40 mg Oral Daily  . carvedilol  12.5 mg Oral BID  . chlorhexidine      . Chlorhexidine Gluconate Cloth  6 each Topical Q0600  . Chlorhexidine Gluconate Cloth  6 each Topical Q0600  . [START ON 06/22/2020] darbepoetin (ARANESP) injection - DIALYSIS  150 mcg Intravenous Q Sat-HD  . diclofenac Sodium  4 g Topical QID  . doxercalciferol  4 mcg Oral Q T,Th,Sa-HD  . fentaNYL      . gabapentin  100 mg Oral Q T,Th,Sat-1800  . heparin injection (subcutaneous)  5,000 Units Subcutaneous Q8H  . heparin sodium (porcine)      . hydrALAZINE  100 mg Oral TID  . insulin aspart  0-15 Units Subcutaneous TID WC  . insulin aspart  0-5 Units Subcutaneous QHS  . insulin glargine  25 Units Subcutaneous Daily  . lidocaine  1 patch Transdermal Q24H  . lidocaine      . linezolid  600 mg Oral Q12H  . melatonin  5 mg Oral QHS  . midazolam      . pantoprazole  40 mg Oral Daily  . polyethylene glycol  17 g Oral Daily  . sevelamer carbonate  1,600 mg Oral TID with meals  . sodium chloride flush  10-40 mL Intracatheter Q12H  . tamsulosin  0.4 mg Oral Daily    BMET    Component Value Date/Time   NA 129 (L) 06/18/2020 0643   K 4.4 06/18/2020 0643   CL 93 (L) 06/18/2020 0643   CO2 23 06/18/2020 0643   GLUCOSE 301 (H) 06/18/2020 0643   BUN 54 (H) 06/18/2020  0643   CREATININE 7.69 (H) 06/18/2020 0643   CALCIUM 8.8 (L) 06/18/2020 0643   GFRNONAA 8 (L) 06/18/2020 0643   GFRAA 9 (L) 06/18/2020 0643   CBC    Component Value Date/Time   WBC 7.5 06/18/2020 0643   RBC 2.55 (L) 06/18/2020 0643   HGB 7.0 (L) 06/18/2020 0643   HCT 23.7 (L) 06/18/2020 0643   PLT 255 06/18/2020 0643   MCV 92.9 06/18/2020 0643   MCH 27.5 06/18/2020 0643   MCHC  29.5 (L) 06/18/2020 0643   RDW 17.1 (H) 06/18/2020 0643   LYMPHSABS 0.7 06/18/2020 0643   MONOABS 0.7 06/18/2020 0643   EOSABS 0.1 06/18/2020 0643   BASOSABS 0.0 06/18/2020 0643   [facility] unit in [place] [facility]) Phone [phone] Scheduled TTS 4 hours His outpatient unit reports noncompliance BF 400; DF 800 F250 dialyzer  2/2.5 ca bath EDW 250 lbs Last weight 272.2 lbs on 6/24 (signed off early and had skipped the treatment prior) aranesp 120 mcg weekly  hectoral 4 mcg each tx  Assessment/Plan:  1. Enterococcus bacteremia- due to tunneled HD catheter.  TDC removed 6/29 s/p temp HD catheter placed 06/14/20.  Now with new LIJ TDC placed today.  Will continue with cubicin and oral zyvox [name] 2. ESRD continue with TTS schedule and plan for HD after catheter placed by IR on 06/18/20.  3. Mitral valve endocarditis- noted on TTE 6/29.  ID following.  Daptomycin for 6 weeks through 07/22/20, oral linezolid for 4 weeks, follow up with ID on 07/08/20. 4. Anemia: due to ESRD.  Continue with ESA 5. CKD-MBD: continue with home meds 6. Nutrition: renal diet 7. Hypertension: stable 8. Vascular access- s/p tunneled HD catheter per IR 06/18/20. 9. Disposition- SW to assist with transportation to his outpatient unit.  Stable for discharge from renal standpoint.  Donetta Potts, MD [employer] [phone]

## 2020-06-18 NOTE — Progress Notes (Signed)
Family Medicine Teaching Service Daily Progress Note Intern Pager: [phone]  Lucas [name] name: Lucas [name] Medical record number: [id number] Date of birth: [birth year] Age: 49 y.o. Gender: male  Primary Care Provider: System, Pcp Not In Consultants: Nephrology, Infectious Disease, Cardiovascular Code Status: FULL  Pt Overview and Major Events to Date:  6/27-Lucas [name] admitted for hyperglycemia and port pain 6/28-blood cultures grew Enterococcus 6/29- TTE found mitral valve vegetation 6/29- Removal of right IJ HD catheter 7/1- TEE completed 7/2- Lucas [name] received Left IJ nontunneled, restarted HD  Antibiotics Gentamicin 6/29-6/30 Vancomycin 6/28-6/29 Linezolid 7/1-7/28 Daptomycin 7/1-8/9    Assessment and Plan: Lucas Alfordis a 49 y.o.malewithwith Enterococcus endocarditis. PMH is significant foratrial flutter s/p ablation, T2DM, ESRD, hyperlipidemia, anemia, CHF, GERD, polysubstance abuse, homelessness.  Enterococcus endocarditis-stable [name] been compliant with his treatment so far. He recognizes that he may be in the hospital for an extended period and is requested that there may be an allowance for him to spend some time outside. CVTS noted that he is not a good surgical candidate due to his significant comorbidities and current social situation. They advised following up in clinic following completion of his antibiotic course. -Nephrologyfollowing, appreciate recommendations -Infectious disease sign off, 7/3, will f/u with Lucas [name] 7/26 -Linezolid day 6/49 -Daptomycin day 5/49, Lucas [name] dose will need readjustment as he received it a day late because he went to IR this morning -weekly labs: CBC w/ diff, BMP, CK -Follow-up on repeat blood cultures from after his line was pulled  Infected dialysis catheter Following a line holiday, and new left IJ catheter was placed on 7/2. Nephrology determined that Lucas [name] will need extra HD tx on today, 7/5, for UF with overload.  Infection is very likely the cause of his significant right upper chest pain on presentation. New catheter scheduled to be placed today. -Continue to monitor catheter site -Back on TTS schedule for dialysis, with addn tx scheduled for today -Tunneled catheter placement  today -Tramadol twice daily as needed for pain -Flexeril 5mg  q 8hrs PRN for muscle pain  Right chest/neck pain Lucas [name] has been noting some right chest/neck pain since his admission. Lucas [name] pain appears to be managed well with voltaren gel, no overnight calls.  We will continue to monitor as X ray has a low sensitivity for osteomyelitis and therefore it cannot be completly ruled out. CT could also be used to detect septic joint. PT recommends outpatient PT, 3x/ week. -Continue to monitor - Flexeril 5mg  q 8hrs PRN for likely muscle pain  - May consider CT of shoulder if pain continues  - Voltaren gel 4x daily scheduled - Continue PT for shoulder   Right forearm swelling He reports that his right forearm has been swollen since he had his right IJ removed, about 5 days ago.  Possible thrombus in the right upper extremity has been ruled out. He has been off of his Eliquis due to changing out his dialysis catheters. - VENOUS VAS US of the upper extremities found no evidence of DVT in the UE, no evidence of superficial vein thrombosis, no evidence of thrombosis in the subclavian. - Will continue to monitor - Will restart Eliquis after Lucas [name] has received his catheter   Type 2 diabetes Blood glucose 155-414 in the past 24 hours. 25 units glargine, 17 units aspart given the past 24 hours. -Lantus 25U daily -SSI moderate -At bedtime coverage  Hypertension -Amlodipine 10 -Hydralazine 100 3 times daily  Atrial flutter s/p ablation Lucas [name]'s home medication include amiodarone and apixaban -amiodarone cont -  Holding apixaban at this time due to removal of PICC -Continuous cardiac  monitoring  Hyperlipidemia -Atorvastatin 40 mg  Bipolar disorder -Abilify 5 mg daily  CHF -Monitor fluid status -Dialysis TTS  GERD -Protonix  Polysubstance abuse We will ensure that he is accompanied when he is allotted time outside. Nurse found Lucas [name] ingesting "chalk." Bag was still in the room, most of the bag was in [place], but it was determined that the chalk was actually clay, which the Lucas [name] confirmed. Lucas [name] reported that this is a cultural remedy for ingesting Vitamins. Lucas [name] was under the impression that the chalk was from [facility] where his family is from and had previously participated in the practice of ingesting chalk for nutrients. The bag has been removed from the Lucas [name]'s room and placed with his possessions.  - Continue to monitor Lucas [name]  Hx ofHomelessness Per chart review Lucas [name] has history of homelessness. Lucas [name] reports that he has not homeless at this time and that he is moving back to [facility] soon. Lucas [name] does acknowledge that he missed his dialysis appointment because his ride to the dialysis appointment fell through. -Social work consulted for confirmation that Lucas [name] has stable living situation  Buttocks wound Monitor as signs or symptoms require   FEN/GI: NPO PPx: Heparin  Disposition: Inpatient  Subjective:  Lucas [name] had no overnight events. Lucas [name] was in IR, unable to assess at this time.  Objective: Temp:  [98 F (36.7 C)-98.3 F (36.8 C)] 98.1 F (36.7 C) (07/06 0212) Pulse Rate:  [77-97] 95 (07/06 0500) Resp:  [18-20] 18 (07/06 0212) BP: (124-162)/(70-100) 132/70 (07/06 0500) SpO2:  [96 %-99 %] 96 % (07/06 0212) Weight:  [139 kg] 139 kg (07/06 0212) Physical Exam: General:  Cardiovascular:  Respiratory:  Abdomen:  Extremities:   Laboratory: Recent Labs  Lab 06/14/20 0420 06/15/20 0357 06/17/20 0432  WBC 12.8* 10.0 10.2  HGB 7.7* 7.4* 7.3*  HCT 24.7* 24.5* 24.1*  PLT 261 242 252   Recent Labs  Lab  06/15/20 1252 06/16/20 0500 06/17/20 0432  NA 131* 131* 131*  K 3.7 4.0 4.2  CL 96* 95* 95*  CO2 24 24 24   BUN 41* 31* 44*  CREATININE 7.23* 5.56* 6.80*  CALCIUM 8.2* 8.3* 8.8*  GLUCOSE 171* 414* 223*     Imaging/Diagnostic Tests: None new  Freida Busman, MD 06/18/2020, 5:48 AM PGY-1, [place] Intern pager: [phone], text pages welcome

## 2020-06-18 NOTE — Plan of Care (Signed)
  Problem: Health Behavior/Discharge Planning: Goal: Ability to manage health-related needs will improve Outcome: Progressing   Problem: Clinical Measurements: Goal: Ability to maintain clinical measurements within normal limits will improve Outcome: Progressing   Problem: Activity: Goal: Risk for activity intolerance will decrease Outcome: Progressing   

## 2020-06-18 NOTE — TOC Progression Note (Signed)
Transition of Care [facility]) - Progression Note    Patient Details  Name: Lucas [name] MRN: [id number] Date of Birth: 1971-01-26  Transition of [facility] CM/SW Contact  Bartholomew Crews, RN Phone Number: [phone] 06/18/2020, 10:11 AM  Clinical Narrative:     Acknowledging consult for medication assist and dialysis outpatient. Per renal navigator note, patient has outpatient hemodialysis in [place]. This is his only option for outpatient hemodialysis. Patient has Medicaid transportation, but needs be in [facility], since [place] transportation will not cross [address]. Patient has qualified for Medicaid $0 copay for medications. TOC following for transition needs.   Expected Discharge Plan: Home/Self Care Barriers to Discharge: Continued Medical Work up  Expected Discharge Plan and Services Expected Discharge Plan: Home/Self Care   Discharge Planning Services: CM Consult   Living arrangements for the past 2 months: Single Family Home                 DME Arranged: N/A         HH Arranged: NA           Social Determinants of Health (SDOH) Interventions    Readmission Risk Interventions No flowsheet data found.

## 2020-06-18 NOTE — Procedures (Signed)
  Procedure: L IJ tunneled HD catheter placement 23cm EBL:   minimal Complications:  none immediate  See full dictation in [facility].  Dillard Cannon MD Main # [phone] Pager  [phone]

## 2020-06-18 NOTE — Progress Notes (Signed)
Patient TDC dressing is bleeding and IR notified, they are sending a PA to check and they informed RN to apply pressure for 2mn to the site.

## 2020-06-18 NOTE — Progress Notes (Signed)
Patient has been non compliant with renal carb modified diet. Patient ordered 2 flat bread pizzas from [employer] with his own money.

## 2020-06-18 NOTE — Progress Notes (Signed)
Pharmacy Antibiotic Note  Lucas [name] is a 49 y.o. male admitted on 06/09/2020 with enterococcal bacteremia. Now found to have a mitral valve vegetation. Pharmacy has been consulted for Daptomycin dosing along with Zyvox per MD for Gent-R Enterococcal MV IE.    The patient is ESRD - with tunneled HD cath placed on 7/6. Had HD early this morning and with plan to get back on TTS schedule. Daptomcyin is currently scheduled q48h - last dose 7/6 post HD, next due 7/7. Adjusted body weight ~100 kg.    Plan: - Continue Daptomycin 850 mg IV every 48 hours for now, will consider transition to HD dosing soon  - Continue Zyvox 600 mg po every 12 hours - Will continue to monitor HD tolerance and schedule   Height: 6\' 4"  (193 cm) Weight: (!) 139 kg (306 lb 7 oz) IBW/kg (Calculated) : 86.8  Temp (24hrs), Avg:98.2 F (36.8 C), Min:98 F (36.7 C), Max:98.5 F (36.9 C)  Recent Labs  Lab 06/13/20 0433 06/13/20 0433 06/14/20 0420 06/15/20 0357 06/15/20 1252 06/16/20 0500 06/17/20 0432 06/18/20 0643  WBC 14.5*  --  12.8* 10.0  --   --  10.2 7.5  CREATININE 7.55*   < > 8.45*  --  7.23* 5.56* 6.80* 7.69*   < > = values in this interval not displayed.    Estimated Creatinine Clearance: 17.9 mL/min (A) (by C-G formula based on SCr of 7.69 mg/dL (H)).    No Known Allergies   Linezolid 7/1>> Dapto 7/1> Vanc 6/28>>7/1 Gent 6/29>> 6/30  7/2 CK 66 7/5 CK 78  6/27 MRSA PCR + 6/27 BCx: Enterococcus faecalis (BCID Enterococcus) (S-amp, vanc) Gent synergy resistant 6/28 BCx: Enterococcus faecalis 6/30 BCx: neg   Thank you for involving pharmacy in this patient's care.  Renold Genta, PharmD, BCPS Clinical Pharmacist Clinical phone for 06/18/2020 until 3p is [phone] 06/18/2020 2:18 PM  **Pharmacist phone directory can be found on [email or web address] listed under [place]**

## 2020-06-18 NOTE — Progress Notes (Signed)
Occupational Therapy Evaluation Patient Details Name: Lucas [name] MRN: [id number] DOB: Dec 16, 1970 Today's Date: 06/18/2020    History of Present Illness Alexx Mcburney is a 49 y.o. male with Enterococcus endocarditis. PMH is significant for atrial flutter s/p ablation, T2DM, ESRD, hyperlipidemia, anemia, CHF, GERD, polysubstance abuse, homelessness.   Clinical Impression   Patient lives in a single level house with a roommate.  He uses a wheelchair for mobility and is able to transfer self at baseline.  He also frequently wears knee pads and crawls for in house mobility.  Patient states he usually sponge bathes as he does not have a shower chair.  Today eval was limited as patient not able to move L shoulder due to restrictions from bleeding catheter site that had just been placed.  He does have chronic L digit numbness and decreased fine motor.  R shoulder pain limiting gross ROM to ~100 degrees flexion.  Requires min assist with UB ADLs and mod with LB ADLs at this time.  Will continue to follow with OT acutely to address the deficits listed below.      Follow Up Recommendations  Outpatient OT - for L hand    Equipment Recommendations  3 in 1 bedside commode    Recommendations for Other Services       Precautions / Restrictions Precautions Precautions: Fall Required Braces or Orthoses: Other Brace Other Brace: Has been fitted for a L prosthesis; reports L prosthesis is still at [facility] in [place]; Pt anticiaptes he will be able to be fitted for R prosthesis soon Restrictions Weight Bearing Restrictions: No      Mobility Bed Mobility Overal bed mobility: Needs Assistance Bed Mobility: Supine to Sit     Supine to sit: Supervision (Pull up on rails - supine to long sit)     General bed mobility comments: pulls self to long sit with bedrails  Transfers                 General transfer comment: Not able to attempt at this time    Balance                                            ADL either performed or assessed with clinical judgement   ADL Overall ADL's : Needs assistance/impaired Eating/Feeding: Independent;Sitting   Grooming: Set up;Sitting   Upper Body Bathing: Minimal assistance;Sitting   Lower Body Bathing: Moderate assistance;Sitting/lateral leans   Upper Body Dressing : Minimal assistance;Sitting   Lower Body Dressing: Moderate assistance;Sitting/lateral leans                 General ADL Comments: Will need to further eval functional mobility. Patient restricted having just returned from catheter placement and surgical site was bleeding.  RN confirmed with IR patient should not move L arm at this time.     Vision         Perception     Praxis      Pertinent Vitals/Pain Pain Assessment: Faces Faces Pain Scale: Hurts even more Pain Location: R shoulder/clavicle; limits shoulder ROM Pain Descriptors / Indicators: Grimacing;Guarding Pain Intervention(s): Limited activity within patient's tolerance;Monitored during session;Repositioned     Hand Dominance Left   Extremity/Trunk Assessment Upper Extremity Assessment Upper Extremity Assessment: RUE deficits/detail;LUE deficits/detail RUE Deficits / Details: Pain limiting shoulder flexion and abduction range. Shoulder flexion 120 degrees, abduction ~100degrees pain free.  RUE: Unable to fully assess due to pain RUE Coordination: decreased gross motor LUE Deficits / Details: Digits 2-5 numb (has been like this for ~1 year patient states) causing difficulty with fine motor taks and coordination. Trouble with thumb opposition LUE Sensation: decreased light touch LUE Coordination: decreased fine motor   Lower Extremity Assessment Lower Extremity Assessment: Defer to PT evaluation       Communication Communication Communication: No difficulties   Cognition Arousal/Alertness: Awake/alert Behavior During Therapy: WFL for tasks  assessed/performed Overall Cognitive Status: No family/caregiver present to determine baseline cognitive functioning                                 General Comments: Cognition appears WFL during eval.  Remebered therapist from attempt yesterday and apologized for being rude.  Able to convey home set up and history well.  Will need to further assess as there has been some reports of interesting behavior (eating dirt) in chart.   General Comments  Catheter placement site bleeding. RN aware.    Exercises     Shoulder Instructions      Home Living Family/patient expects to be discharged to:: Private residence Living Arrangements: Non-relatives/Friends Available Help at Discharge: Friend(s) (Roomate) Type of Home: House Home Access: Stairs to enter Technical brewer of Steps: 3   Home Layout: One level     Bathroom Shower/Tub: Chief Strategy Officer: Wheelchair - manual          Prior Functioning/Environment Level of Independence: Needs assistance  Gait / Transfers Assistance Needed: Uses wheelchair for mobility, and going to HD; has knee pads, and sounds like he "walks" on his knees, and occasionally tips up on the distal end of his L residual limb for transfers ADL's / Homemaking Assistance Needed: Has trouble with fine motor tasks (buttoning, opening containers).  Has been doing sponge baths becuase he does not have a shower chair   Comments: reports he can use a sliding board, though did not indicate he has one        OT Problem List: Decreased strength;Decreased range of motion;Decreased activity tolerance;Impaired balance (sitting and/or standing);Decreased coordination;Decreased cognition;Decreased safety awareness;Impaired UE functional use;Pain      OT Treatment/Interventions: Self-care/ADL training;Therapeutic exercise;DME and/or AE instruction;Therapeutic activities;Cognitive remediation/compensation;Patient/family  education;Balance training    OT Goals(Current goals can be found in the care plan section) Acute Rehab OT Goals Patient Stated Goal: To eat food - (coming off NPO) OT Goal Formulation: With patient Time For Goal Achievement: 07/02/20 Potential to Achieve Goals: Good  OT Frequency: Min 2X/week   Barriers to D/C: Decreased caregiver support          Co-evaluation              AM-PAC OT "6 Clicks" Daily Activity     Outcome Measure Help from another person eating meals?: None Help from another person taking care of personal grooming?: A Little Help from another person toileting, which includes using toliet, bedpan, or urinal?: A Lot Help from another person bathing (including washing, rinsing, drying)?: A Lot Help from another person to put on and taking off regular upper body clothing?: A Little Help from another person to put on and taking off regular lower body clothing?: A Lot 6 Click Score: 16   End of Session Nurse Communication: Mobility status  Activity Tolerance: Patient tolerated treatment well;Treatment limited secondary to medical  complications (Comment) (catheter site bleeding) Patient left: in bed;with call bell/phone within reach  OT Visit Diagnosis: Other abnormalities of gait and mobility (R26.89);Muscle weakness (generalized) (M62.81);Other symptoms and signs involving cognitive function;Other symptoms and signs involving the nervous system (R29.898)                Time: 6979-4801 OT Time Calculation (min): 12 min Charges:  OT General Charges $OT Visit: 1 Visit OT Evaluation $OT Eval Moderate Complexity: [place], OTR/L   Phylliss Bob 06/18/2020, 11:57 AM

## 2020-06-18 NOTE — Plan of Care (Signed)
  Problem: Pain Managment: Goal: General experience of comfort will improve Outcome: Progressing   

## 2020-06-18 NOTE — Progress Notes (Addendum)
Patient assessed at the bedside. Dr. Vernard Gambles present. Patient with continuous blood oozing from skin insertion site. Using sterile technique and barrier, the site was cleaned and Dr. Vernard Gambles placed a purse-string suture around HD catheter. Hemostasis achieved. New dressing applied. Suture was placed with dissolvable suture - no follow up needed.   Soyla Dryer, [place] [phone] 06/18/2020, 12:40 PM

## 2020-06-18 NOTE — Progress Notes (Signed)
Subjective  Patient was in IR this morning and seen later. Patient appeared sitting up in bed while his sheets were changed. The patient was upset that he had not eaten. Patient had a new IJ tunneled catheter inserted on his left. Patient did have some bleeding after the exam, IR saw patient and sutured the area which stopped patient bleeding. He reported that he had spoken with the nephrologist and that he was told that he would be leaving soon and would be able to receive the rest of his abx outpatient at dialysis. Patient had no reports regarding right sided shoulder pain.   Physical General: patient is sitting up in bed Cardio: Regular rate and rhythm no murmur detected Resp: Clear breath sounds to bilateral ausc, no increased work of breathing noted GI: No pain to palpation, normoactive bowel sounds Extremities: Bilateral BKA, R, shoulder has ROM passed 100 deg., no pain to palpation along the clavicle Skin: Right IJ catheter appears clean and not bleeding, L IJ appears clean

## 2020-06-19 LAB — GLUCOSE, CAPILLARY
Glucose-Capillary: 317 mg/dL — ABNORMAL HIGH (ref 70–99)
Glucose-Capillary: 288 mg/dL — ABNORMAL HIGH (ref 70–99)
Glucose-Capillary: 271 mg/dL — ABNORMAL HIGH (ref 70–99)
Glucose-Capillary: 254 mg/dL — ABNORMAL HIGH (ref 70–99)

## 2020-06-19 MED ORDER — INSULIN GLARGINE 100 UNIT/ML ~~LOC~~ SOLN
30.0000 [IU] | Freq: Every day | SUBCUTANEOUS | Status: DC
  Administered 2020-06-19 – 2020-06-23 (×5): 30 [IU] via SUBCUTANEOUS
  Filled 2020-06-19 (×7): qty 0.3

## 2020-06-19 NOTE — Progress Notes (Signed)
Physical Therapy Treatment Patient Details Name: Lucas [name] MRN: [id number] DOB: 1971-04-16 Today's Date: 06/19/2020    History of Present Illness Lucas [name] is a 49 y.o. male with Enterococcus endocarditis. PMH is significant for atrial flutter s/p ablation, T2DM, ESRD, hyperlipidemia, anemia, CHF, GERD, polysubstance abuse, homelessness.    PT Comments    Continuing work on functional mobility and activity tolerance;  Session focused on therapeutic exercise in prep for getting a prosthesis; worked on gluteal strengthening, hamstring/hip flexor stretching; performed therex well;   Noteworthy that pt tells me he intands to put his knee pads on and knee-crawl to the bathroom -- I advised against this, and notified Gwenlyn Perking, RN;   Reached out to Dr. Candie Chroman to see about consulting a prosthetist to assess for readiness for a prosthesis.   Follow Up Recommendations  Outpatient PT (Would need to arrange for rides (on non HD days, likely))     Equipment Recommendations  3in1 (PT) (drop-arm BSC; )    Recommendations for Other Services Other (comment) (Prosthetist consult)     Precautions / Restrictions Precautions Precautions: Fall Other Brace: Had been going to [facility] in [place] for a L porsthesis, but did not follow up; This PT called [facility], and they indicated there is no L prosthesis yet    Mobility  Bed Mobility Overal bed mobility: Needs Assistance Bed Mobility: Supine to Sit     Supine to sit: Supervision     General bed mobility comments: pulls self to long sit with bedrails; once up, uses bedrails to change position, turn in seated position, and reciprocally scoot  Transfers Overall transfer level: Needs assistance   Transfers: Lateral/Scoot Transfers          Lateral/Scoot Transfers: Min guard General transfer comment: simultaed lateral scoot transfer, scooting at EOB  Ambulation/Gait                 Stairs             Wheelchair  Mobility    Modified Rankin (Stroke Patients Only)       Balance                                            Cognition Arousal/Alertness: Awake/alert Behavior During Therapy: WFL for tasks assessed/performed Overall Cognitive Status: No family/caregiver present to determine baseline cognitive functioning                                        Exercises Total Joint Exercises Bridges: AROM;Both;10 reps (with knees bolstered) Other Exercises Other Exercises: sidelying hip extension R and L with tactile cues for form, 10 reps each Other Exercises: Isomentric hip abduction in sitting with belt around knees x10 reps Other Exercises: hamstring stretches seated with 5 breath hold; x3 R and LLEs    General Comments General comments (skin integrity, edema, etc.): Removed pt's R LE dressing to visualize the wound, crescent shaped, anterior aspect of lower leg, just distal to knee -- it will need to be fully healed before we can start prosthesis training; Pt tells me he will re-wrap his LLE; porvided him with 4x4s and kerlix; and notified Albert, RN      Pertinent Vitals/Pain Pain Assessment: Faces Faces Pain Scale: Hurts little more Pain Location: R shoulder/clavicle; limits  shoulder ROM Pain Descriptors / Indicators: Grimacing;Guarding Pain Intervention(s): Monitored during session    Home Living                      Prior Function            PT Goals (current goals can now be found in the care plan section) Acute Rehab PT Goals Patient Stated Goal: to get connected with a prosthetist PT Goal Formulation: With patient Time For Goal Achievement: 07/01/20 Potential to Achieve Goals: Good Progress towards PT goals: Progressing toward goals    Frequency    Min 3X/week      PT Plan Current plan remains appropriate    Co-evaluation              AM-PAC PT "6 Clicks" Mobility   Outcome Measure  Help needed turning from  your back to your side while in a flat bed without using bedrails?: None Help needed moving from lying on your back to sitting on the side of a flat bed without using bedrails?: None Help needed moving to and from a bed to a chair (including a wheelchair)?: A Lot Help needed standing up from a chair using your arms (e.g., wheelchair or bedside chair)?: Total Help needed to walk in hospital room?: Total Help needed climbing 3-5 steps with a railing? : Total 6 Click Score: 13    End of Session Equipment Utilized During Treatment: Other (comment) (belt) Activity Tolerance: Patient tolerated treatment well Patient left: in bed;with call bell/phone within reach Nurse Communication: Mobility status PT Visit Diagnosis: Other abnormalities of gait and mobility (R26.89);Muscle weakness (generalized) (M62.81);Pain Pain - Right/Left: Right Pain - part of body: Shoulder     Time: 1312-1340 PT Time Calculation (min) (ACUTE ONLY): 28 min  Charges:  $Therapeutic Exercise: 8-22 mins $Therapeutic Activity: 8-22 mins                     Roney Marion, PT  Acute Rehabilitation Services Pager [phone] Office [place] 06/19/2020, 4:02 PM

## 2020-06-19 NOTE — Plan of Care (Signed)
°  Problem: Coping: °Goal: Level of anxiety will decrease °Outcome: Progressing °  °

## 2020-06-19 NOTE — Progress Notes (Signed)
Patient ID: Lucas [name], male   DOB: 11-May-1971, 49 y.o.   MRN: [id number] S: No new complaints O:BP (!) 142/84 (BP Location: Left Arm)   Pulse 93   Temp 97.6 F (36.4 C) (Oral)   Resp 18   Ht 6\' 4"  (1.93 m)   Wt (!) 139 kg   SpO2 100%   BMI 37.30 kg/m   Intake/Output Summary (Last 24 hours) at 06/19/2020 1133 Last data filed at 06/19/2020 0813 Gross per 24 hour  Intake 676 ml  Output 0 ml  Net 676 ml   Intake/Output: I/O last 3 completed shifts: In: 105 [P.O.:1724] Out: 4010 [Urine:10; Other:4000]  Intake/Output this shift:  Total I/O In: 240 [P.O.:240] Out: -  Weight change:  Gen: NAD CVS: no rub Resp: cta Abd: obese, +BS, soft, Nt Ext: s/p bilateral BKA's.  Recent Labs  Lab 06/13/20 0433 06/14/20 0420 06/15/20 0357 06/15/20 1252 06/16/20 0500 06/17/20 0432 06/18/20 0643  NA 131* 130*  --  131* 131* 131* 129*  K 4.1 4.2  --  3.7 4.0 4.2 4.4  CL 97* 96*  --  96* 95* 95* 93*  CO2 21* 21*  --  24 24 24 23   GLUCOSE 206* 206*  --  171* 414* 223* 301*  BUN 43* 52*  --  41* 31* 44* 54*  CREATININE 7.55* 8.45*  --  7.23* 5.56* 6.80* 7.69*  ALBUMIN  --   --   --   --  2.3*  --   --   CALCIUM 8.2* 8.3*  --  8.2* 8.3* 8.8* 8.8*  PHOS  --   --  4.8*  --  3.7  --   --    Liver Function Tests: Recent Labs  Lab 06/16/20 0500  ALBUMIN 2.3*   No results for input(s): LIPASE, AMYLASE in the last 168 hours. No results for input(s): AMMONIA in the last 168 hours. CBC: Recent Labs  Lab 06/13/20 0433 06/13/20 0433 06/14/20 0420 06/14/20 0420 06/15/20 0357 06/17/20 0432 06/18/20 0643  WBC 14.5*   < > 12.8*   < > 10.0 10.2 7.5  NEUTROABS  --   --   --   --   --  8.2* 6.0  HGB 7.8*   < > 7.7*   < > 7.4* 7.3* 7.0*  HCT 25.3*   < > 24.7*   < > 24.5* 24.1* 23.7*  MCV 92.0  --  91.5  --  94.2 94.5 92.9  PLT 250   < > 261   < > 242 252 255   < > = values in this interval not displayed.   Cardiac Enzymes: Recent Labs  Lab 06/14/20 0420 06/17/20 0432  CKTOTAL 66 78    CBG: Recent Labs  Lab 06/18/20 1119 06/18/20 1640 06/18/20 2032 06/19/20 0637 06/19/20 1124  GLUCAP 135* 164* 242* 317* 288*    Iron Studies: No results for input(s): IRON, TIBC, TRANSFERRIN, FERRITIN in the last 72 hours. Studies/Results: IR Fluoro Guide CV Line Left  Result Date: 06/18/2020 CLINICAL DATA:  End-stage renal disease on dialysis. Recent removal of infected tunneled right chest hemodialysis catheter with temporary left catheter placed. Long-term dialysis catheter placement requested. EXAM: TUNNELED HEMODIALYSIS CATHETER PLACEMENT WITH ULTRASOUND AND FLUOROSCOPIC GUIDANCE TECHNIQUE: The procedure, risks, benefits, and alternatives were explained to the patient. Questions regarding the procedure were encouraged and answered. The patient understands and consents to the procedure. The patient was already receiving adequate prophylactic antibiotic coverage. Patency of the left IJ vein was  confirmed with ultrasound with image documentation. An appropriate skin site was determined. Region was prepped using maximum barrier technique including cap and mask, sterile gown, sterile gloves, large sterile sheet, and Chlorhexidine as cutaneous antisepsis. The region was infiltrated locally with 1% lidocaine. Intravenous Fentanyl 63mcg and Versed 1.5mg  were administered as conscious sedation during continuous monitoring of the patient's level of consciousness and physiological / cardiorespiratory status by the radiology RN, with a total moderate sedation time of of 21 minutes. under real-time ultrasound guidance, the left IJ vein was accessed with a 21 gauge micropuncture needle above the temp cath; the needle tip within the vein was confirmed with ultrasound image documentation. Needle exchanged over the 018 guidewire for transitional dilator, which allowed advancement of a Benson wire into the IVC. Over this, an MPA catheter was advanced. A Palindrome 23 hemodialysis catheter was tunneled from the  left anterior chest wall approach to the left IJ dermatotomy site. The MPA catheter was exchanged over an Amplatz wire for serial vascular dilators which allow placement of a peel-away sheath, through which the catheter was advanced under intermittent fluoroscopy, positioned with its tips in the proximal and midright atrium. Spot chest radiograph confirms good catheter position. No pneumothorax. Catheter was flushed and primed per protocol. Catheter secured externally with O Prolene sutures. The left IJ dermatotomy site was closed with Dermabond. The left temp cath was removed. COMPLICATIONS: COMPLICATIONS None immediate FLUOROSCOPY TIME:  84 seconds; 33 mGy COMPARISON:  06/14/2020 IMPRESSION: 1. Technically successful placement of tunneled left IJ hemodialysis catheter with ultrasound and fluoroscopic guidance. Ready for routine use. ACCESS: Remains approachable for percutaneous intervention as needed. Electronically Signed   By: Lucrezia Europe M.D.   On: 06/18/2020 13:23   IR US Guide Vasc Access Left  Result Date: 06/18/2020 CLINICAL DATA:  End-stage renal disease on dialysis. Recent removal of infected tunneled right chest hemodialysis catheter with temporary left catheter placed. Long-term dialysis catheter placement requested. EXAM: TUNNELED HEMODIALYSIS CATHETER PLACEMENT WITH ULTRASOUND AND FLUOROSCOPIC GUIDANCE TECHNIQUE: The procedure, risks, benefits, and alternatives were explained to the patient. Questions regarding the procedure were encouraged and answered. The patient understands and consents to the procedure. The patient was already receiving adequate prophylactic antibiotic coverage. Patency of the left IJ vein was confirmed with ultrasound with image documentation. An appropriate skin site was determined. Region was prepped using maximum barrier technique including cap and mask, sterile gown, sterile gloves, large sterile sheet, and Chlorhexidine as cutaneous antisepsis. The region was infiltrated  locally with 1% lidocaine. Intravenous Fentanyl 70mcg and Versed 1.5mg  were administered as conscious sedation during continuous monitoring of the patient's level of consciousness and physiological / cardiorespiratory status by the radiology RN, with a total moderate sedation time of of 21 minutes. under real-time ultrasound guidance, the left IJ vein was accessed with a 21 gauge micropuncture needle above the temp cath; the needle tip within the vein was confirmed with ultrasound image documentation. Needle exchanged over the 018 guidewire for transitional dilator, which allowed advancement of a Benson wire into the IVC. Over this, an MPA catheter was advanced. A Palindrome 23 hemodialysis catheter was tunneled from the left anterior chest wall approach to the left IJ dermatotomy site. The MPA catheter was exchanged over an Amplatz wire for serial vascular dilators which allow placement of a peel-away sheath, through which the catheter was advanced under intermittent fluoroscopy, positioned with its tips in the proximal and midright atrium. Spot chest radiograph confirms good catheter position. No pneumothorax. Catheter was flushed and  primed per protocol. Catheter secured externally with O Prolene sutures. The left IJ dermatotomy site was closed with Dermabond. The left temp cath was removed. COMPLICATIONS: COMPLICATIONS None immediate FLUOROSCOPY TIME:  84 seconds; 33 mGy COMPARISON:  06/14/2020 IMPRESSION: 1. Technically successful placement of tunneled left IJ hemodialysis catheter with ultrasound and fluoroscopic guidance. Ready for routine use. ACCESS: Remains approachable for percutaneous intervention as needed. Electronically Signed   By: Lucrezia Europe M.D.   On: 06/18/2020 13:23   . amiodarone  200 mg Oral Daily  . amLODipine  10 mg Oral Daily  . apixaban  5 mg Oral BID  . ARIPiprazole  5 mg Oral Daily  . atorvastatin  40 mg Oral Daily  . carvedilol  12.5 mg Oral BID  . Chlorhexidine Gluconate Cloth   6 each Topical Q0600  . Chlorhexidine Gluconate Cloth  6 each Topical Q0600  . [START ON 06/22/2020] darbepoetin (ARANESP) injection - DIALYSIS  150 mcg Intravenous Q Sat-HD  . diclofenac Sodium  4 g Topical QID  . doxercalciferol  4 mcg Oral Q T,Th,Sa-HD  . gabapentin  100 mg Oral Q T,Th,Sat-1800  . hydrALAZINE  100 mg Oral TID  . insulin aspart  0-15 Units Subcutaneous TID WC  . insulin aspart  0-5 Units Subcutaneous QHS  . insulin glargine  30 Units Subcutaneous Daily  . lidocaine  1 patch Transdermal Q24H  . linezolid  600 mg Oral Q12H  . melatonin  5 mg Oral QHS  . pantoprazole  40 mg Oral Daily  . polyethylene glycol  17 g Oral Daily  . sevelamer carbonate  1,600 mg Oral TID with meals  . sodium chloride flush  10-40 mL Intracatheter Q12H  . tamsulosin  0.4 mg Oral Daily    BMET    Component Value Date/Time   NA 129 (L) 06/18/2020 0643   K 4.4 06/18/2020 0643   CL 93 (L) 06/18/2020 0643   CO2 23 06/18/2020 0643   GLUCOSE 301 (H) 06/18/2020 0643   BUN 54 (H) 06/18/2020 0643   CREATININE 7.69 (H) 06/18/2020 0643   CALCIUM 8.8 (L) 06/18/2020 0643   GFRNONAA 8 (L) 06/18/2020 0643   GFRAA 9 (L) 06/18/2020 0643   CBC    Component Value Date/Time   WBC 7.5 06/18/2020 0643   RBC 2.55 (L) 06/18/2020 0643   HGB 7.0 (L) 06/18/2020 0643   HCT 23.7 (L) 06/18/2020 0643   PLT 255 06/18/2020 0643   MCV 92.9 06/18/2020 0643   MCH 27.5 06/18/2020 0643   MCHC 29.5 (L) 06/18/2020 0643   RDW 17.1 (H) 06/18/2020 0643   LYMPHSABS 0.7 06/18/2020 0643   MONOABS 0.7 06/18/2020 0643   EOSABS 0.1 06/18/2020 0643   BASOSABS 0.0 06/18/2020 0643   [facility]) Phone [phone] Scheduled TTS 4 hours His outpatient unit reports noncompliance BF 400; DF 800 F250 dialyzer  2/2.5 ca bath EDW 250 lbs Last weight 272.2 lbs on 6/24 (signed off early and had skipped the treatment prior) aranesp 120 mcg weekly  hectoral 4 mcg each  tx  Assessment/Plan:  1. Enterococcus bacteremia/Mitral valve Endocarditis- due to tunneled HD catheter. TDC removed 6/29 s/p temp HD catheter placed 06/14/20.  Now with new LIJ TDC placed today.  Will continue with IV cubicin and oral zyvox [name] 1. Not candidate for surgery due to multiple co-morbidities, social issues, and polysubstance abuse 2. Noted vegetation on TTE 6/29 3. daptomycin through 07/22/20 and oral linezolid for 4 weeks  4. Follow up with ID on 07/08/20 2. ESRDcontinue with TTS schedule and plan for HD after catheter placed by IR on 06/18/20.  3. Mitral valve endocarditis- noted on TTE 6/29. ID following. Daptomycin for 6 weeks through 07/22/20, oral linezolid for 4 weeks, follow up with ID on 07/08/20. 4. Anemia:due to ESRD. Continue with ESA 5. CKD-MBD:continue with home meds 6. Nutrition:renal diet 7. Hypertension:stable 8. Vascular access- s/p tunneled HD catheter per IR 06/18/20. 9. Disposition- SW to assist with transportation to his outpatient unit.  Stable for discharge from renal standpoint, however there appears to be issues with disposition.  Donetta Potts, MD [employer] [phone]

## 2020-06-19 NOTE — Plan of Care (Signed)
?  Problem: Health Behavior/Discharge Planning: ?Goal: Ability to manage health-related needs will improve ?Outcome: Progressing ?  ?Problem: Nutritional: ?Goal: Ability to make healthy dietary choices will improve ?Outcome: Progressing ?  ?

## 2020-06-19 NOTE — Progress Notes (Signed)
Was send into patient's room,found patient hanging himself on left head side rail,his butts was partially sitting on regular chair.Tried to prop him up using another chair but it was unsuccessful due to the patient's big body size.With the help of the patient,two nurses guided him for a soft landing on the floor .Patient was not complaining of any pain on his buttocks for this assisted fall.Ordered a low setting bed and transferred him into it.Reminded patient always call for help.

## 2020-06-19 NOTE — Progress Notes (Signed)
Family Medicine Teaching Service Daily Progress Note Intern Pager: [phone]  Patient name: Lucas [name] Medical record number: [id number] Date of birth: 1971/10/05 Age: 49 y.o. Gender: male  Primary Care Provider: System, Pcp Not In Consultants: Nephrology, Infectious Disease, Cardiology Code Status: FULL  Pt Overview and Major Events to Date:  6/27-patient admitted for hyperglycemia and port pain 6/28-blood cultures grew Enterococcus 6/29- TTE found mitral valve vegetation 6/29- Removal of right IJ HD catheter 7/1- TEE completed 7/2- Patient received Left IJ nontunneled, restarted HD 7/7- Patient received long term tunneled Left IJ  Antibiotics Gentamicin 6/29-6/30 Vancomycin 6/28-6/29 Linezolid 7/1-7/28 Daptomycin 7/1-8/9  Assessment and Plan: Lucas [name] a 49 y.o.malewith Enterococcus endocarditis. PMH is significant foratrial flutter s/p ablation, T2DM, ESRD, hyperlipidemia, anemia, CHF, GERD, polysubstance abuse, homelessness.  Enterococcus endocarditis-stable [name] compliant with his treatment so far. He recognizes that he may be in the hospital for an extended period and is requested that there may be an allowance for him to spend some time outside. CVTS noted that he is not a good surgical candidate due to his significant comorbidities and current social situation. They advised following up in clinic following completion of his antibiotic course. -Nephrologyfollowing, appreciate recommendations -Infectious disease sign off, 7/3, will f/u with patient 7/26 -Linezolid day7/42 -Daptomycin day6/28, consult pharmacy for dosing with HD -weekly labs: CBC w/ diff, BMP, CK -Follow-up on repeat blood cultures from after his line was pulled  Infected dialysis catheter Following a line holiday, and new left IJ catheter was placed on 7/2.New IJ tunneled cathter placed, patient had bleeding after the surgery. IR sutured with dissolvable sutures at the  bedside, no f/u needed. -Continue to monitor catheter site -Back on TTS schedule for dialysis -Tramadol twice daily as needed for pain -Flexeril 5mg  q 8hrs PRN for muscle pain  Right chest/neck pain Lucas [name] noting some right chest/neck pain since his admission. Patient pain appears to be managed well with voltaren gel, no overnight calls.  We will continue to monitor as X ray has a low sensitivity for osteomyelitis and therefore it cannot be completly ruled out. CT could also be used to detect septic joint. PT recommends outpatient PT, 3x/ week. -Continue to monitor - Flexeril 5mg  q 8hrs PRN for likely muscle pain - May consider CT of shoulder if pain continues  - Voltaren gel 4x daily scheduled - Continue PT for shoulder   Right forearm swelling- stable Patient has not reported increased pain or swelling in the arm. Possible thrombus in the right upper extremityhas [name] ruled out.  -VENOUSVAS US of the upper extremities found no evidence of DVT in the UE, no evidence of superficial vein thrombosis, no evidence of thrombosis in the subclavian. - Will continue to monitor - Eliquis restarted   Type 2 diabetes Blood glucose 155-414 in the past 24 hours. 25 units glargine, 15 units aspart given the past 24 hours. Patient is hyperglycemic and is ordering outside meals. BGL ranges,135- 317 , we will readjust insulin regimen. -Increase Lantus from 25U daily to 30 U -SSI moderate -At bedtime coverage  Hypertension -Amlodipine 10 -Hydralazine 100 3 times daily  Atrial flutter s/p ablation Patient's home medication include amiodarone and apixaban -amiodarone cont - Restarted Eliquis -Continuous cardiac monitoring  Hyperlipidemia -Atorvastatin 40 mg  Bipolar disorder -Abilify 5 mg daily  CHF -Monitor fluid status -Dialysis TTS  GERD -Protonix  Polysubstance abuse We will ensure that he is accompaniedwhenhe is allotted time outside. Nurse found  patient ingesting "chalk."  Bag was still in the room, most of the bag was in [place], but it was determined that the chalk was actually clay, which the patient confirmed. Patient reported that this is a cultural remedy for ingesting Vitamins. Patient was under the impression that the chalk was from [facility] where his family is from and had previously participated in the practice of ingesting chalk for nutrients. The bag has [name] removed from the patient's room and placed with his possessions.  - Continue to monitor patient  Hx ofHomelessness Per chart review patient has history of homelessness. Patient reports that he has not homeless at this time and that he is moving back to [facility] soon. Patient does acknowledge that he missed his dialysis appointment because his ride to the dialysis appointment fell through. -Social work consulted for confirmation that patient has stable living situation  Buttocks wound Monitor as signs or symptoms require   FEN/GI: Renal carb modified CLE:[id number]  Disposition: Inpatient  Subjective:  No overnight events; however per nurse note patient is not maintaining his renal, carb modified diet and ordered 2 [employer] flatbread pizzas with his own money. Patient is upset to learn that he is still recommended to stay for his IV abx. After learning that he is still recommended to stay inpatient he requested to have his clay back to eat, he reports that he is "craving it." Patient reports that he is having less shoulder pain and is able to sleep better.  Objective: Temp:  [98.1 F (36.7 C)-98.6 F (37 C)] 98.6 F (37 C) (07/07 0516) Pulse Rate:  [81-94] 94 (07/07 0516) Resp:  [14-18] 18 (07/07 0516) BP: (117-138)/(60-94) 136/81 (07/07 0516) SpO2:  [96 %-100 %] 100 % (07/07 0516) Physical Exam: General: Patient sitting up comfortably in bed Cardiovascular: Regular rate and rhythm no murmurs noted Respiratory: Clear and equal to bilateral ausc, no extra work of  breathing noted Abdomen: Normoactive bowel sounds, taut Extremities: BKA, bilateral shoulder sites look clean  Laboratory: Recent Labs  Lab 06/15/20 0357 06/17/20 0432 06/18/20 0643  WBC 10.0 10.2 7.5  HGB 7.4* 7.3* 7.0*  HCT 24.5* 24.1* 23.7*  PLT 242 252 255   Recent Labs  Lab 06/16/20 0500 06/17/20 0432 06/18/20 0643  NA 131* 131* 129*  K 4.0 4.2 4.4  CL 95* 95* 93*  CO2 24 24 23   BUN 31* 44* 54*  CREATININE 5.56* 6.80* 7.69*  CALCIUM 8.3* 8.8* 8.8*  GLUCOSE 414* 223* 301*      Imaging/Diagnostic Tests: 06/19/2020- IR US guided Vasc Access Left 1. Technically successful placement of tunneled left IJ hemodialysis catheter with ultrasound and fluoroscopic guidance. Ready for routine use.  ACCESS: Remains approachable for percutaneous intervention as needed.  2. Some bleeding after procedure, IR placed dissolvable suture at the bedside with successful tx  06/19/2020- IR Fluoro Guide CV Left Successful - no immediate complications IMPRESSION: 1. Technically successful placement of tunneled left IJ hemodialysis catheter with ultrasound and fluoroscopic guidance. Ready for routine use.  Freida Busman, MD 06/19/2020, 7:56 AM PGY-1, [place] Intern pager: [phone], text pages welcome

## 2020-06-20 LAB — RENAL FUNCTION PANEL
Albumin: 2.4 g/dL — ABNORMAL LOW (ref 3.5–5.0)
Anion gap: 13 (ref 5–15)
BUN: 82 mg/dL — ABNORMAL HIGH (ref 6–20)
CO2: 22 mmol/L (ref 22–32)
Calcium: 8.9 mg/dL (ref 8.9–10.3)
Chloride: 97 mmol/L — ABNORMAL LOW (ref 98–111)
Creatinine, Ser: 9.59 mg/dL — ABNORMAL HIGH (ref 0.61–1.24)
GFR calc Af Amer: 7 mL/min — ABNORMAL LOW (ref >60)
GFR calc non Af Amer: 6 mL/min — ABNORMAL LOW (ref >60)
Glucose, Bld: 174 mg/dL — ABNORMAL HIGH (ref 70–99)
Phosphorus: 4.8 mg/dL — ABNORMAL HIGH (ref 2.5–4.6)
Potassium: 4.8 mmol/L (ref 3.5–5.1)
Sodium: 132 mmol/L — ABNORMAL LOW (ref 135–145)

## 2020-06-20 LAB — GLUCOSE, CAPILLARY
Glucose-Capillary: 157 mg/dL — ABNORMAL HIGH (ref 70–99)
Glucose-Capillary: 146 mg/dL — ABNORMAL HIGH (ref 70–99)
Glucose-Capillary: 168 mg/dL — ABNORMAL HIGH (ref 70–99)
Glucose-Capillary: 111 mg/dL — ABNORMAL HIGH (ref 70–99)

## 2020-06-20 LAB — CBC
HCT: 23 % — ABNORMAL LOW (ref 39.0–52.0)
Hemoglobin: 7 g/dL — ABNORMAL LOW (ref 13.0–17.0)
MCH: 28.7 pg (ref 26.0–34.0)
MCHC: 30.4 g/dL (ref 30.0–36.0)
MCV: 94.3 fL (ref 80.0–100.0)
Platelets: 246 10*3/uL (ref 150–400)
RBC: 2.44 MIL/uL — ABNORMAL LOW (ref 4.22–5.81)
RDW: 17.2 % — ABNORMAL HIGH (ref 11.5–15.5)
WBC: 8.9 10*3/uL (ref 4.0–10.5)
nRBC: 0 % (ref 0.0–0.2)

## 2020-06-20 MED ORDER — HEPARIN SODIUM (PORCINE) 1000 UNIT/ML IJ SOLN
INTRAMUSCULAR | Status: AC
  Administered 2020-06-20: 3800 [IU]
  Filled 2020-06-20: qty 4

## 2020-06-20 MED ORDER — SODIUM CHLORIDE 0.9 % IV SOLN
1000.0000 mg | INTRAVENOUS | Status: DC
  Administered 2020-06-22 – 2020-07-06 (×3): 1000 mg via INTRAVENOUS
  Filled 2020-06-20 (×3): qty 20

## 2020-06-20 MED ORDER — SODIUM CHLORIDE 0.9 % IV SOLN
800.0000 mg | INTRAVENOUS | Status: DC
  Administered 2020-06-20 – 2020-07-04 (×5): 800 mg via INTRAVENOUS
  Filled 2020-06-20 (×5): qty 16

## 2020-06-20 MED ORDER — DOXERCALCIFEROL 4 MCG/2ML IV SOLN
INTRAVENOUS | Status: AC
  Administered 2020-06-20: 4 ug
  Filled 2020-06-20: qty 2

## 2020-06-20 NOTE — Plan of Care (Signed)
  Problem: Health Behavior/Discharge Planning: Goal: Ability to manage health-related needs will improve Outcome: Progressing   Problem: Clinical Measurements: Goal: Will remain free from infection Outcome: Progressing Goal: Diagnostic test results will improve Outcome: Progressing   Problem: Safety: Goal: Ability to remain free from injury will improve Outcome: Progressing

## 2020-06-20 NOTE — TOC Progression Note (Signed)
Transition of Care [facility]) - Progression Note    Patient Details  Name: Lucas [name] MRN: [id number] Date of Birth: 1971-09-25  [facility]) CM/SW Contact  Bartholomew Crews, RN Phone Number: [phone] 06/20/2020, 3:50 PM  Clinical Narrative:     Spoke with patient at bedside with renal navigator. Patient verified current address in Epic, but stated that he will be moving to a home in [facility]. Patient stated that he has worked out his dialysis transportation with [facility] [place] who is agreeable to taking him to his dialysis center in [place], and the [employer] will transport him home after dialysis. Discussed difficulty with Medicaid transportation crossing [employer], but patient stated that he has it worked out. Discussed that patient doesn't want to live in [place], but patient did not realize that Jule Ser was in [facility]. Patient stated that his daughter is in [place], and he will consider this option.   Patient states that he has a PCP in [facility], but that he wants to switch to [facility], since they know all he has been going through, and he appreciates the care he is receiving.   Verified that patient has a $0 copay. Patient states that he was set up with [facility] for delivery of his medications. Patient would like to [facility] for discharge medications. If patient is discharged on weekend, scripts should be sent to [facility] on Friday and medications will be stored in main pharmacy until discharged.   Patient stated that he has been advised that his daptomycin cannot be provided outpatient, and that he will remain inpatient until course of antibiotics complete.   Patient states that he has transportation home at discharge. His back up plan is ambulance transport.   Patient states that he has a wheelchair and one prosthesis at home. He states that when his leg heals, he will  be getting a prosthetic for that leg as well.   TOC following for transition needs.   Expected Discharge Plan: Home/Self Care Barriers to Discharge: Continued Medical Work up  Expected Discharge Plan and Services Expected Discharge Plan: Home/Self Care   Discharge Planning Services: CM Consult   Living arrangements for the past 2 months: Single Family Home                 DME Arranged: N/A         HH Arranged: NA           Social Determinants of Health (SDOH) Interventions    Readmission Risk Interventions No flowsheet data found.

## 2020-06-20 NOTE — Progress Notes (Signed)
Pharmacy Antibiotic Note  Lucas [name] is a 49 y.o. male admitted on 06/09/2020 with enterococcal bacteremia. Now found to have a mitral valve vegetation. Pharmacy has been consulted for Daptomycin dosing along with Zyvox per MD for Gent-R Enterococcal MV IE.    The patient is ESRD - s/ptunneled HD catheter per IR7/6/21.  Pt is now on routine TTS dialysis schedule.  Will adjust daptomycin dose accordingly.  Adjusted body weight ~100 kg.    Plan: - Daptomycin 800 mg IV following HD Tues/Thur and 1000 mg following HD on Sat. - Continue Zyvox 600 mg po every 12 hours - Will continue to monitor HD tolerance and schedule - Next CK scheduled for Tuesday.   Height: 6\' 4"  (193 cm) Weight: (!) 149.7 kg (330 lb) IBW/kg (Calculated) : 86.8  Temp (24hrs), Avg:98 F (36.7 C), Min:97.6 F (36.4 C), Max:98.6 F (37 C)  Recent Labs  Lab 06/14/20 0420 06/14/20 0420 06/15/20 0357 06/15/20 1252 06/16/20 0500 06/17/20 0432 06/18/20 0643 06/20/20 0714  WBC 12.8*  --  10.0  --   --  10.2 7.5 8.9  CREATININE 8.45*   < >  --  7.23* 5.56* 6.80* 7.69* 9.59*   < > = values in this interval not displayed.    Estimated Creatinine Clearance: 14.9 mL/min (A) (by C-G formula based on SCr of 9.59 mg/dL (H)).    No Known Allergies   Linezolid 7/1>> (7/29) Dapto 7/1> (8/9)  Vanc 6/28>>7/1 Gent 6/29>> 6/30  7/2 CK 66  6/27 MRSA PCR + 6/27 BCx: Enterococcus faecalis (BCID Enterococcus) (S-amp, vanc) Gent synergy resistant 6/28 BCx: Enterococcus faecalis 6/30 BCx: ngtd   Thank you for allowing pharmacy to be a part of this patient's care.  [employer], Pharm.D., BCPS Clinical Pharmacist  **Pharmacist phone directory can be found on [email or web address] listed under [place].  06/20/2020 2:23 PM

## 2020-06-20 NOTE — Progress Notes (Signed)
Family Medicine Teaching Service Daily Progress Note Intern Pager: [phone]  Patient name: Lucas [name] Medical record number: [id number] Date of birth: [birth year] Age: 49 y.o. Gender: male  Primary Care Provider: System, Pcp Not In Consultants: Nephrology, Infectious Disease, Cardiology Code Status: Full  Pt Overview and Major Events to Date:  6/27-patient admitted for hyperglycemia and port pain 6/28-blood cultures grew Enterococcus 6/29- TTE found mitral valve vegetation 6/29- Removal of right IJ HD catheter 7/1- TEE completed 7/2- Patient received Left IJ nontunneled, restarted HD 7/7- Patient received long term tunneled Left IJ  Antibiotics Gentamicin 6/29-6/30 Vancomycin 6/28-6/29 Linezolid 7/1-7/28 Daptomycin 7/1-8/9  Assessment and Plan: Lucas Alfordis a 49 y.o.malewith Enterococcus endocarditis. PMH is significant foratrial flutter s/p ablation, T2DM, ESRD, hyperlipidemia, anemia, CHF, GERD, polysubstance abuse, homelessness.  Enterococcus endocarditis-stable Lucas [name] been compliant with his treatment so far. He recognizes that he may be in the hospital for an extended period and is requested that there may be an allowance for him to spend some time outside. CVTS noted that he is not a good surgical candidate due to his significant comorbidities and current social situation. They advised following up in clinic following completion of his antibiotic course. -Nephrologyfollowing, appreciate recommendations -Infectious disease sign off, 7/3, will f/u with patient 7/26 -Linezolid day7/42 -Daptomycin day7/28, consult pharmacy for dosing with HD -weekly labs: CBC w/ diff, BMP, CK -Follow-up on repeat blood cultures from after his line was pulled  Infected dialysis catheter Following a line holiday, and new left IJ catheter was placed on 7/2.New IJ tunneled cathter placed, patient had bleeding after the surgery. IR sutured with dissolvable sutures at the  bedside, no f/u needed. -Continue to monitor catheter site -Back on TTS schedule for dialysis -Tramadol twice daily as needed for pain -Flexeril 5mg  q 8hrs PRN for muscle pain  Right chest/neck pain: stable Lucas [name] has been noting some right chest/neck pain since his admission. Patientpain appears to be managed well with voltaren gel, no overnight calls. His chest pain has not bothered him for a few days. We will continue to monitor as X ray has a low sensitivity for osteomyelitis and therefore it cannot be completly ruled out. CT could also be used to detect septic joint. PT recommends outpatient PT, 3x/ week. -Continue to monitor - Flexeril 5mg  q 8hrs PRN for likely muscle pain - May consider CT of shoulder if pain continues  - Voltaren gel 4x daily scheduled - Continue PT for shoulder   Right forearm swelling- stable Patient has not reported increased pain or swelling in the arm.Possible thrombus in the right upper extremityhas been ruled out.  -VENOUSVAS US of the upper extremities found no evidence of DVT in the UE, no evidence of superficial vein thrombosis, no evidence of thrombosis in the subclavian. - Will continue to monitor - Eliquis restarted  L bicep pain Patient reports that he began having pain in his left bicep after surgery. The pain is constant, but is worse with movement. The pain follows a cord-like pattern. The patient also reports that he feels more short of breath after his surgery. - Venous US of L upper extremity  Type 2 diabetes Blood glucose 157-288 in the past 24 hours. 30 units glargine, 30 units aspart given the past 24 hours. Patient is hyperglycemic and is ordering outside meals.  we will readjust insulin regimens needed. We will look into whether or not the patient can receive prosthetics inpatient. -Continue Lantus 30 U -SSI moderate -At bedtime coverage - continue gabapentin  for diabetic neuropathy  Hypertension BP sys  118-141. -Amlodipine 10 -Hydralazine 100 3 times daily  Atrial flutter s/p ablation Patient's home medication include amiodarone and apixaban -amiodaronecont - Restarted Eliquis -Continuous cardiac monitoring  Hyperlipidemia -Atorvastatin 40 mg  Bipolar disorder -Abilify 5 mg daily  CHF -Monitor fluid status -Dialysis TTS  GERD -Protonix  Polysubstance abuse We will ensure that he is accompaniedwhenhe is allotted time outside. Nurse found patient ingesting "chalk." Bag was still in the room, most of the bag was in [place], but it was determined that the chalk was actually clay, which the patient confirmed. Patient reportedthat this is a cultural remedy for ingesting Vitamins. Patient was under the impression that the chalk was from [facility] where his family is from and had previously participated in the practice of ingesting chalk for nutrients. The bag has been removed from the patient's room and placed with his possessions.  - Continue to monitor patient  Hx ofHomelessness Per chart review patient has history of homelessness. Patient reports that he is not homeless at this time and that he is moving back to [facility] soon. Patient does acknowledge that he missed his dialysis appointment because his ride to the dialysis appointment fell through. -Social work consulted for confirmation that patient has stable living situation  Buttocks wound Monitor as signs or symptoms require   FEN/GI: Renal carb modified PPx: Eliquis  Disposition: Inpatient  Subjective:  Patient did not have any overnight events. Patient is reporting that he started having pain in his left bicep. Pt continues to see the patient and has inquired with FPTS about possibly getting the patient prosthetics while inpatient.   Objective: Temp:  [97.6 F (36.4 C)-98.6 F (37 C)] 97.6 F (36.4 C) (07/08 0651) Pulse Rate:  [89-98] 93 (07/08 0730) Resp:  [18-19] 18 (07/08 0651) BP:  (118-158)/(69-89) 141/80 (07/08 0730) SpO2:  [97 %-100 %] 97 % (07/08 0651) Weight:  [155.1 kg-155.6 kg] 155.1 kg (07/08 0651) Physical Exam: General: Resting comfortably at HD Cardiovascular: Regular rate and rhythm no murmur to ausc Respiratory: Clear and equal to bilateral ausc. Abdomen: Taut, normoactive bowel sounds, no pain to palpation Extremities: Pain to movement and palpation of the left bicep in a cordlike pattern  Laboratory: Recent Labs  Lab 06/17/20 0432 06/18/20 0643 06/20/20 0714  WBC 10.2 7.5 8.9  HGB 7.3* 7.0* 7.0*  HCT 24.1* 23.7* 23.0*  PLT 252 255 246   Recent Labs  Lab 06/16/20 0500 06/17/20 0432 06/18/20 0643  NA 131* 131* 129*  K 4.0 4.2 4.4  CL 95* 95* 93*  CO2 24 24 23   BUN 31* 44* 54*  CREATININE 5.56* 6.80* 7.69*  CALCIUM 8.3* 8.8* 8.8*  GLUCOSE 414* 223* 301*     Imaging/Diagnostic Tests: None new  Freida Busman, MD 06/20/2020, 7:59 AM PGY-1, [place] Intern pager: [phone], text pages welcome

## 2020-06-20 NOTE — Procedures (Signed)
I was present at this dialysis session. I have reviewed the session itself and made appropriate changes.  He is complaining of left arm pain, starting in his bicep moving to antecubital space.  No swelling but given multiple central lines, will order duplex of left arm to r/o DVT  Vital signs in last 24 hours:  Temp:  [97.6 F (36.4 C)-98.6 F (37 C)] 97.6 F (36.4 C) (07/08 0651) Pulse Rate:  [86-99] 99 (07/08 0830) Resp:  [18-19] 18 (07/08 0651) BP: (118-158)/(69-98) 137/73 (07/08 0830) SpO2:  [97 %-100 %] 97 % (07/08 0651) Weight:  [155.1 kg-155.6 kg] 155.1 kg (07/08 0651) Weight change:  Filed Weights   06/18/20 0212 06/20/20 0453 06/20/20 0651  Weight: (!) 139 kg (!) 155.6 kg (!) 155.1 kg    Recent Labs  Lab 06/20/20 0714  NA 132*  K 4.8  CL 97*  CO2 22  GLUCOSE 174*  BUN 82*  CREATININE 9.59*  CALCIUM 8.9  PHOS 4.8*    Recent Labs  Lab 06/17/20 0432 06/18/20 0643 06/20/20 0714  WBC 10.2 7.5 8.9  NEUTROABS 8.2* 6.0  --   HGB 7.3* 7.0* 7.0*  HCT 24.1* 23.7* 23.0*  MCV 94.5 92.9 94.3  PLT 252 255 246    Scheduled Meds: . amiodarone  200 mg Oral Daily  . amLODipine  10 mg Oral Daily  . apixaban  5 mg Oral BID  . ARIPiprazole  5 mg Oral Daily  . atorvastatin  40 mg Oral Daily  . carvedilol  12.5 mg Oral BID  . Chlorhexidine Gluconate Cloth  6 each Topical Q0600  . Chlorhexidine Gluconate Cloth  6 each Topical Q0600  . [START ON 06/22/2020] darbepoetin (ARANESP) injection - DIALYSIS  150 mcg Intravenous Q Sat-HD  . diclofenac Sodium  4 g Topical QID  . doxercalciferol  4 mcg Oral Q T,Th,Sa-HD  . gabapentin  100 mg Oral Q T,Th,Sat-1800  . hydrALAZINE  100 mg Oral TID  . insulin aspart  0-15 Units Subcutaneous TID WC  . insulin aspart  0-5 Units Subcutaneous QHS  . insulin glargine  30 Units Subcutaneous Daily  . lidocaine  1 patch Transdermal Q24H  . linezolid  600 mg Oral Q12H  . melatonin  5 mg Oral QHS  . pantoprazole  40 mg Oral Daily  .  polyethylene glycol  17 g Oral Daily  . sevelamer carbonate  1,600 mg Oral TID with meals  . sodium chloride flush  10-40 mL Intracatheter Q12H  . tamsulosin  0.4 mg Oral Daily   Continuous Infusions: . DAPTOmycin (CUBICIN)  IV 850 mg (06/19/20 2017)   PRN Meds:.acetaminophen, cyclobenzaprine, sodium chloride flush, traMADol   Donetta Potts,  MD 06/20/2020, 8:42 AM

## 2020-06-21 ENCOUNTER — Inpatient Hospital Stay (HOSPITAL_COMMUNITY): Payer: Medicaid Other

## 2020-06-21 DIAGNOSIS — M79609 Pain in unspecified limb: Secondary | ICD-10-CM

## 2020-06-21 LAB — IRON AND TIBC
Iron: 30 ug/dL — ABNORMAL LOW (ref 45–182)
Iron: 28 ug/dL — ABNORMAL LOW (ref 45–182)
Saturation Ratios: 16 % — ABNORMAL LOW (ref 17.9–39.5)
Saturation Ratios: 14 % — ABNORMAL LOW (ref 17.9–39.5)
TIBC: 188 ug/dL — ABNORMAL LOW (ref 250–450)
TIBC: 195 ug/dL — ABNORMAL LOW (ref 250–450)
UIBC: 158 ug/dL
UIBC: 167 ug/dL

## 2020-06-21 LAB — GLUCOSE, CAPILLARY
Glucose-Capillary: 71 mg/dL (ref 70–99)
Glucose-Capillary: 134 mg/dL — ABNORMAL HIGH (ref 70–99)
Glucose-Capillary: 199 mg/dL — ABNORMAL HIGH (ref 70–99)
Glucose-Capillary: 250 mg/dL — ABNORMAL HIGH (ref 70–99)

## 2020-06-21 LAB — RETICULOCYTES
Immature Retic Fract: 14 % (ref 2.3–15.9)
RBC.: 2.51 MIL/uL — ABNORMAL LOW (ref 4.22–5.81)
Retic Count, Absolute: 44.4 10*3/uL (ref 19.0–186.0)
Retic Ct Pct: 1.8 % (ref 0.4–3.1)

## 2020-06-21 LAB — VITAMIN B12: Vitamin B-12: 221 pg/mL (ref 180–914)

## 2020-06-21 LAB — FOLATE: Folate: 9.4 ng/mL (ref >5.9)

## 2020-06-21 LAB — FERRITIN: Ferritin: 561 ng/mL — ABNORMAL HIGH (ref 24–336)

## 2020-06-21 NOTE — Progress Notes (Signed)
Renal Navigator contacted [name] to confirm that they will transport patient from [place] (patient confirmed that he is still living at the [facility] in [place]), and was informed that they [name] transport patient.  Navigator contacted [facility] Transportation who confirmed that patient can be transported from [facility] to [facility] for [name] and changed the transportation company from [name] (learned that they do not have the capacity to transport patient) to [name]. Medicaid Transportation staff asked that they be contacted when patient is discharged from the hospital so that his standing order for OP HD (now with [name]) can resume.  Navigator spoke with [name]/OP HD Social Worker to inform of above. Navigator also asked [name] if [facility] has Daptomycin available and she called back after speaking with CM and MD. She reports that this medication is available at the clinic, however, shared that the concern is that he would not get it consistently given his non-compliance with OP HD.  Alphonzo Cruise,  Renal Navigator [phone]

## 2020-06-21 NOTE — Progress Notes (Signed)
Family Medicine Teaching Service Daily Progress Note Intern Pager: [phone]  Patient name: Lucas [name] Medical record number: [id number] Date of birth: 1971-09-19 Age: 49 y.o. Gender: male  Primary Care Provider: System, Pcp Not In Consultants: Nephrology, Infectious Disease, Cardiology Code Status: Full  Pt Overview and Major Events to Date:  6/27-patient admitted for hyperglycemia and port pain 6/28-blood cultures grew Enterococcus 6/29- TTE found mitral valve vegetation 6/29- Removal of right IJ HD catheter 7/1- TEE completed 7/2- Patient received Left IJ nontunneled, restarted HD 7/7- Patient received long term tunneled Left IJ  Antibiotics Gentamicin 6/29-6/30 Vancomycin 6/28-6/29 Linezolid 7/1-7/28 Daptomycin 7/1-8/9  Assessment and Plan:  Lucas Alfordis a 49 y.o.malewith Enterococcus endocarditis. PMH is significant foratrial flutter s/p ablation, T2DM, ESRD, hyperlipidemia, anemia, CHF, GERD, polysubstance abuse, homelessness.  Enterococcus endocarditis-stable [name] been compliant with his treatment so far. He recognizes that he may be in the hospital for an extended period. CVTS noted that he is not a good surgical candidate due to his significant comorbidities and current social situation. They advised following up in clinic following completion of his antibiotic course. -Nephrologyfollowing, appreciate recommendations -Infectious disease sign off, 7/3, will f/u with patient 7/26 -Linezolid day8/42 -Daptomycin day8/28,consult pharmacy for dosing with HD -weekly labs: CBC w/ diff, BMP, CK -Follow-up on repeat blood cultures from after his line was pulled - patient Hgb 7 on recheck, will not transfuse at this time. Hgb remains stable on repeat CBC. Patient reported that he bled a lot after the procedure prior to IR suturing the area at the bedside.  Infected dialysis catheter Following a line holiday, and new left nontunneled IJ catheter was  placed on 7/2.New IJ tunneled catheter placed 7/7, patient had bleeding after the surgery. IR sutured with dissolvable sutures at the bedside, no f/u needed. Patient Hgb 7. Patient is scheduled for Aranesp q Saturday at HD. -Continue to monitor catheter site -Back on TTS schedule for dialysis -Tramadol twice daily as needed for pain -Flexeril 5mg  q 8hrs PRN for muscle pain - Will order anemia panel  Right chest/neck pain: stable Lucas [name] had been noting some right chest/neck pain since his admission. Patientpain appears to be managed well with voltaren gel, no overnight calls. His chest pain has not bothered him for a few days. We will continue to monitor as X ray has a low sensitivity for osteomyelitis and therefore it cannot be completly ruled out. CT could also be used to detect septic joint. PT recommends outpatient PT, 3x/ week. -Continue to monitor - Flexeril 5mg  q 8hrs PRN for likely muscle pain - May consider CT of shoulder if pain continues  - Voltaren gel 4x daily scheduled - Continue PT for shoulder   Right forearm swelling- stable Patient has not reported increased pain or swelling in the arm.Possible thrombus in the right upper extremityhas been ruled out.  -VENOUSVAS US of the upper extremities found no evidence of DVT in the UE, no evidence of superficial vein thrombosis, no evidence of thrombosis in the subclavian. - Will continue to monitor -Lucas [name] restarted  L bicep pain Patient reports that he began having pain in his medial, left bicep after procedure. The pain is constant, but is worse with movement. The pain follows a cord-like pattern. The patient also reports that he feels more short of breath after his surgery. - Venous US of L upper extremity  Type 2 diabetes Blood glucose 71-168 in the past 24 hours. 30 units glargine, 5units aspart given the past 24 hours. Patient  is hyperglycemic and is ordering outside meals.  we will readjust insulin  regimens needed. Morning BGL was 71.  Ordering porosthetic/orthotic device requested by PT. Patient BGL morning 71, the patient felt hypoglycemic and was provided apple juice.  -Continue Lantus30 U -SSI moderate -At bedtime coverage - continue gabapentin for diabetic neuropathy - Placed order for bilateral lower extremity prothstetics  Hypertension BP sys 115-141. -Amlodipine 10 -Hydralazine 100 3 times daily  Atrial flutter s/p ablation Patient's home medication include amiodarone and apixaban -amiodaronecont - cont Lucas [name] -Continuous cardiac monitoring  Hyperlipidemia -Atorvastatin 40 mg  Bipolar disorder -Abilify 5 mg daily  CHF -Monitor fluid status -Dialysis TTS  GERD -Protonix  Polysubstance abuse We will ensure that he is accompaniedwhenhe is allotted time outside. Nurse found patient ingesting "chalk." Bag was still in the room, most of the bag was in [place], but it was determined that the chalk was actually clay, which the patient confirmed. Patient reportedthat this is a cultural remedy for ingesting Vitamins. Patient was under the impression that the chalk was from [facility] where his family is from and had previously participated in the practice of ingesting chalk for nutrients. The bag has been removed from the patient's room and placed with his possessions.  - Continue to monitor patient  Hx ofHomelessness Per chart review patient has history of homelessness. Patient reports that he is not homeless at this time and that he is moving back to [facility] soon. Patient does acknowledge that he missed his dialysis appointment because his ride to the dialysis appointment fell through. -Social work consulted for confirmation that patient has stable living situation  Buttocks wound Monitor as signs or symptoms require   FEN/GI: Renal carb modified PPx: Lucas [name]  Disposition: Inpatient  Subjective:  Patient had no overnight events. He has not had his  UE DVT doppler.   Objective: Temp:  [98.3 F (36.8 C)-99 F (37.2 C)] 98.4 F (36.9 C) (07/09 0450) Pulse Rate:  [86-99] 89 (07/09 0450) Resp:  [17-19] 19 (07/09 0450) BP: (115-141)/(73-98) 119/81 (07/09 0450) SpO2:  [99 %-100 %] 100 % (07/08 2052) Weight:  [149.7 kg] 149.7 kg (07/08 2052) Physical Exam: General: Patient is sitting on the edge of his bed, wrapped in a blanket. He says his mood is "ok." Cardiovascular: Regular rate and rhythm, no murmur heard Respiratory: Clear and equal to bil. auscl. Abdomen: Taut, no pain to palpation, normoactive pain Extremities: BKA, Continues to have pain in the L medial bicep. Pain is with movement and palpation  Laboratory: Recent Labs  Lab 06/17/20 0432 06/18/20 0643 06/20/20 0714  WBC 10.2 7.5 8.9  HGB 7.3* 7.0* 7.0*  HCT 24.1* 23.7* 23.0*  PLT 252 255 246   Recent Labs  Lab 06/17/20 0432 06/18/20 0643 06/20/20 0714  NA 131* 129* 132*  K 4.2 4.4 4.8  CL 95* 93* 97*  CO2 24 23 22   BUN 44* 54* 82*  CREATININE 6.80* 7.69* 9.59*  CALCIUM 8.8* 8.8* 8.9  GLUCOSE 223* 301* 174*      Imaging/Diagnostic Tests: None new  Freida Busman, MD 06/21/2020, 7:13 AM PGY-1, [place] Intern pager: [phone], text pages welcome

## 2020-06-21 NOTE — Progress Notes (Signed)
PT Cancellation Note  Patient Details Name: [name] MRN: [id number] DOB: 08-Jul-1971   Cancelled Treatment:    Reason Eval/Treat Not Completed: Patient at procedure or test/unavailable Pt off floor at vascular, will follow up as time allows.   Marguarite Arbour A Zira Helinski 06/21/2020, 9:23 AM Marisa Severin, PT, DPT Acute Rehabilitation Services Pager [phone] Office [phone]

## 2020-06-21 NOTE — Progress Notes (Signed)
Patient ID: Lucas [name], male   DOB: May 02, 1971, 49 y.o.   MRN: [id number] S: No events overnight.  Duplex negative for DVT of LUE. O:BP (!) 123/93 (BP Location: Left Arm)   Pulse 89   Temp 98.4 F (36.9 C) (Oral)   Resp 16   Ht 6\' 4"  (1.93 m)   Wt (!) 149.7 kg   SpO2 100%   BMI 40.17 kg/m   Intake/Output Summary (Last 24 hours) at 06/21/2020 1307 Last data filed at 06/21/2020 0450 Gross per 24 hour  Intake 1190 ml  Output 0 ml  Net 1190 ml   Intake/Output: I/O last 3 completed shifts: In: 9702 [P.O.:1434; IV Piggyback:116] Out: 4000 [Other:4000]  Intake/Output this shift:  No intake/output data recorded. Weight change: -5.897 kg Gen: NAD CVS: no rub Resp: occ rhonchi Abd: benign Ext: s/p bilateral BKA's  Recent Labs  Lab 06/15/20 0357 06/15/20 1252 06/16/20 0500 06/17/20 0432 06/18/20 0643 06/20/20 0714  NA  --  131* 131* 131* 129* 132*  K  --  3.7 4.0 4.2 4.4 4.8  CL  --  96* 95* 95* 93* 97*  CO2  --  24 24 24 23 22   GLUCOSE  --  171* 414* 223* 301* 174*  BUN  --  41* 31* 44* 54* 82*  CREATININE  --  7.23* 5.56* 6.80* 7.69* 9.59*  ALBUMIN  --   --  2.3*  --   --  2.4*  CALCIUM  --  8.2* 8.3* 8.8* 8.8* 8.9  PHOS 4.8*  --  3.7  --   --  4.8*   Liver Function Tests: Recent Labs  Lab 06/16/20 0500 06/20/20 0714  ALBUMIN 2.3* 2.4*   No results for input(s): LIPASE, AMYLASE in the last 168 hours. No results for input(s): AMMONIA in the last 168 hours. CBC: Recent Labs  Lab 06/15/20 0357 06/15/20 0357 06/17/20 0432 06/18/20 0643 06/20/20 0714  WBC 10.0   < > 10.2 7.5 8.9  NEUTROABS  --   --  8.2* 6.0  --   HGB 7.4*   < > 7.3* 7.0* 7.0*  HCT 24.5*   < > 24.1* 23.7* 23.0*  MCV 94.2  --  94.5 92.9 94.3  PLT 242   < > 252 255 246   < > = values in this interval not displayed.   Cardiac Enzymes: Recent Labs  Lab 06/17/20 0432  CKTOTAL 78   CBG: Recent Labs  Lab 06/20/20 1148 06/20/20 1623 06/20/20 2053 06/21/20 0643 06/21/20 1208  GLUCAP 146*  168* 111* 71 134*    Iron Studies: No results for input(s): IRON, TIBC, TRANSFERRIN, FERRITIN in the last 72 hours. Studies/Results: VAS [place] UPPER EXTREMITY VENOUS DUPLEX  Result Date: 06/21/2020 UPPER VENOUS STUDY  Indications: Pain, and new IJ TDC Comparison Study: Prior RT UE Venous 06-16-2020 Performing Technologist: Darlin Coco  Examination Guidelines: A complete evaluation includes B-mode imaging, spectral Doppler, color Doppler, and power Doppler as needed of all accessible portions of each vessel. Bilateral testing is considered an integral part of a complete examination. Limited examinations for reoccurring indications may be performed as noted.  Right Findings: +----------+------------+---------+-----------+----------+-------+ RIGHT     CompressiblePhasicitySpontaneousPropertiesSummary +----------+------------+---------+-----------+----------+-------+ Subclavian               Yes       Yes                      +----------+------------+---------+-----------+----------+-------+  Left Findings: +----------+------------+---------+-----------+----------+-------+ LEFT  CompressiblePhasicitySpontaneousPropertiesSummary +----------+------------+---------+-----------+----------+-------+ IJV                      Yes       Yes                      +----------+------------+---------+-----------+----------+-------+ Subclavian    Full       Yes       Yes                      +----------+------------+---------+-----------+----------+-------+ Axillary      Full       Yes       Yes                      +----------+------------+---------+-----------+----------+-------+ Brachial      Full       Yes       Yes                      +----------+------------+---------+-----------+----------+-------+ Radial        Full                                          +----------+------------+---------+-----------+----------+-------+ Ulnar         Full                                           +----------+------------+---------+-----------+----------+-------+ Cephalic      Full                                          +----------+------------+---------+-----------+----------+-------+ Basilic       Full                                          +----------+------------+---------+-----------+----------+-------+  Summary:  Right: No evidence of thrombosis in the subclavian.  Left: No evidence of deep vein thrombosis in the upper extremity. No evidence of superficial vein thrombosis in the upper extremity.  *See table(s) above for measurements and observations.    Preliminary    . amiodarone  200 mg Oral Daily  . amLODipine  10 mg Oral Daily  . apixaban  5 mg Oral BID  . ARIPiprazole  5 mg Oral Daily  . atorvastatin  40 mg Oral Daily  . carvedilol  12.5 mg Oral BID  . Chlorhexidine Gluconate Cloth  6 each Topical Q0600  . Chlorhexidine Gluconate Cloth  6 each Topical Q0600  . [START ON 06/22/2020] darbepoetin (ARANESP) injection - DIALYSIS  150 mcg Intravenous Q Sat-HD  . diclofenac Sodium  4 g Topical QID  . doxercalciferol  4 mcg Oral Q T,Th,Sa-HD  . gabapentin  100 mg Oral Q T,Th,Sat-1800  . hydrALAZINE  100 mg Oral TID  . insulin aspart  0-15 Units Subcutaneous TID WC  . insulin aspart  0-5 Units Subcutaneous QHS  . insulin glargine  30 Units Subcutaneous Daily  . lidocaine  1 patch Transdermal Q24H  . linezolid  600 mg Oral Q12H  . melatonin  5 mg  Oral QHS  . pantoprazole  40 mg Oral Daily  . polyethylene glycol  17 g Oral Daily  . sevelamer carbonate  1,600 mg Oral TID with meals  . sodium chloride flush  10-40 mL Intracatheter Q12H  . tamsulosin  0.4 mg Oral Daily    BMET    Component Value Date/Time   NA 132 (L) 06/20/2020 0714   K 4.8 06/20/2020 0714   CL 97 (L) 06/20/2020 0714   CO2 22 06/20/2020 0714   GLUCOSE 174 (H) 06/20/2020 0714   BUN 82 (H) 06/20/2020 0714   CREATININE 9.59 (H) 06/20/2020 0714   CALCIUM 8.9 06/20/2020  0714   GFRNONAA 6 (L) 06/20/2020 0714   GFRAA 7 (L) 06/20/2020 0714   CBC    Component Value Date/Time   WBC 8.9 06/20/2020 0714   RBC 2.51 (L) 06/21/2020 1238   RBC 2.44 (L) 06/20/2020 0714   HGB 7.0 (L) 06/20/2020 0714   HCT 23.0 (L) 06/20/2020 0714   PLT 246 06/20/2020 0714   MCV 94.3 06/20/2020 0714   MCH 28.7 06/20/2020 0714   MCHC 30.4 06/20/2020 0714   RDW 17.2 (H) 06/20/2020 0714   LYMPHSABS 0.7 06/18/2020 0643   MONOABS 0.7 06/18/2020 0643   EOSABS 0.1 06/18/2020 0643   BASOSABS 0.0 06/18/2020 0643   [facility]) Phone [phone] Scheduled TTS 4 hours His outpatient unit reports noncompliance BF 400; DF 800 F250 dialyzer  2/2.5 ca bath EDW 250 lbs Last weight 272.2 lbs on 6/24 (signed off early and had skipped the treatment prior) aranesp 120 mcg weekly  hectoral 4 mcg each tx  Assessment/Plan:  1. Enterococcus bacteremia/Mitral valve Endocarditis- due to tunneled HD catheter. TDC removed 6/29 s/p temp HD catheter placed 06/14/20.Now with new LIJ TDC placed today. Will continue with IV cubicin andoralzyvoxper ID 1. Not candidate for surgery due to multiple co-morbidities, social issues, and polysubstance abuse 2. Noted vegetation on TTE 6/29 3. daptomycin through 07/22/20 and oral linezolid for 4 weeks (his home HD unit does have daptomycin) 4. Follow up with ID on 07/08/20 2. ESRDcontinue with TTS schedule and plan for HD after catheter placed by IR on 06/18/20.  3. Mitral valve endocarditis- noted on TTE 6/29. ID following. Daptomycin for 6 weeks through 07/22/20, oral linezolid for 4 weeks, follow up with ID on 07/08/20. 4. Anemia:due to ESRD. Continue with ESA 5. CKD-MBD:continue with home meds 6. Nutrition:renal diet 7. Hypertension:stable 8. Vascular access-s/ptunneled HD catheter per IR7/6/21. 9. Disposition- SW to assist with transportation to his outpatient unit.Stable for discharge from renal standpoint,  however there appears to be issues with disposition.    Donetta Potts, MD [employer] [phone]

## 2020-06-21 NOTE — Progress Notes (Signed)
Renal Navigator faxed H&P and Renal Consult note to patient's OP HD clinic/[facility] per their request. Navigator will also fax records at discharge to provide continuity of care.  Alphonzo Cruise, [facility]al Navigator [phone]

## 2020-06-21 NOTE — Progress Notes (Signed)
Occupational Therapy Treatment Patient Details Name: Lucas [name] MRN: [id number] DOB: 19-Feb-1971 Today's Date: 06/21/2020    History of present illness Lucas [name] is a 49 y.o. male with Enterococcus endocarditis. PMH is significant for atrial flutter s/p ablation, T2DM, ESRD, hyperlipidemia, anemia, CHF, GERD, polysubstance abuse, homelessness.   OT comments  This 49 yo male admitted with above presents to acute OT being seen for RUE and address use of button hook/zipper pull. He is weak in his left grasp/pinch and decreased AROM right shoulder. He will continue to benefit from acute OT with follow up OPOT.  Follow Up Recommendations  Outpatient OT    Equipment Recommendations  3 in 1 bedside commode (drop arm)       Precautions / Restrictions Precautions Precautions: Fall Other Brace: Had been going to [facility] in [place] for a L prosthesis, but did not follow up. Spoke with attending and they had already called ortho tech about getting [facility] to follow up here about this, but would follow up with ortho tech again. Pt also wanted [facility] to assess him fro RLE prothesis (he currently has a wound on his residual limb) Restrictions Weight Bearing Restrictions: No       Mobility Bed Mobility Overal bed mobility: Modified Independent                       ADL either performed or assessed with clinical judgement   ADL                                         General ADL Comments: Issued pt a button hook. At home pt normally stops his W/C at his bathroom door, gets down out of his wheelchiar wtih his knee pads on and crawls over to toilet and gets himself up on toilet (may attempt this next session with clean fall prevention mats on the floor)     Vision Patient Visual Report: No change from baseline            Cognition Arousal/Alertness: Awake/alert Behavior During Therapy: WFL for tasks assessed/performed Overall Cognitive Status: Within Functional  Limits for tasks assessed                                          Exercises Other Exercises Other Exercises: Had pt work on shoulder flexion and abduction (only able to get to ~80 degrees of each with pain)           Pertinent Vitals/ Pain       Pain Assessment: Faces Faces Pain Scale: Hurts little more Pain Location: R shoulder/clavicle; limits shoulder ROM Pain Descriptors / Indicators: Grimacing;Guarding Pain Intervention(s): Monitored during session         Frequency  Min 2X/week        Progress Toward Goals  OT Goals(current goals can now be found in the care plan section)  Progress towards OT goals: Progressing toward goals  Acute Rehab OT Goals Patient Stated Goal: to get prothestics for both legs OT Goal Formulation: With patient Time For Goal Achievement: 07/02/20 Potential to Achieve Goals: Good  Plan Discharge plan remains appropriate       AM-PAC OT "6 Clicks" Daily Activity     Outcome Measure   Help from another person eating meals?:  None Help from another person taking care of personal grooming?: A Little Help from another person toileting, which includes using toliet, bedpan, or urinal?: A Lot Help from another person bathing (including washing, rinsing, drying)?: A Little Help from another person to put on and taking off regular upper body clothing?: A Little Help from another person to put on and taking off regular lower body clothing?: A Little 6 Click Score: 18    End of Session    OT Visit Diagnosis: Other abnormalities of gait and mobility (R26.89);Muscle weakness (generalized) (M62.81);Other symptoms and signs involving cognitive function;Other symptoms and signs involving the nervous system (R29.898);Pain Pain - Right/Left: Right Pain - part of body: Shoulder   Activity Tolerance Patient tolerated treatment well   Patient Left in bed           Time: 1438-1500 OT Time Calculation (min): 22 min  Charges: OT  General Charges $OT Visit: 1 Visit OT Treatments $Self Care/Home Management : 8-22 mins  [place], OTR/L Acute Rehab Services Pager [phone] Office [phone]      Almon Register 06/21/2020, 5:59 PM

## 2020-06-21 NOTE — Plan of Care (Signed)
  Problem: Nutrition: Goal: Adequate nutrition will be maintained Outcome: Progressing   

## 2020-06-21 NOTE — Progress Notes (Signed)
Orthopedic Tech Progress Note Patient Details:  Lucas [name] 05-11-1971 [id number] Called [facility] again regarding patient. Will be out here later today. Patient ID: Lucas [name], male   DOB: [birth year], 49 y.o.   MRN: [id number]   [name] 06/21/2020, 4:07 PM

## 2020-06-21 NOTE — Progress Notes (Signed)
LT upper extremity venous study completed.   See Cv Proc for preliminary results.   Lucas [name]

## 2020-06-21 NOTE — Progress Notes (Signed)
Orthopedic Tech Progress Note Patient Details:  Lucas [name] Feb 21, 1971 [id number] Called in order to [facility] for "Needs consultation to assess readiness for prosthesis" Patient ID: [name], male   DOB: 06/14/1971, 49 y.o.   MRN: [id number]   [name] 06/21/2020, 9:47 AM

## 2020-06-22 DIAGNOSIS — B999 Unspecified infectious disease: Secondary | ICD-10-CM

## 2020-06-22 LAB — CBC WITH DIFFERENTIAL/PLATELET
Abs Immature Granulocytes: 0.13 10*3/uL — ABNORMAL HIGH (ref 0.00–0.07)
Basophils Absolute: 0 10*3/uL (ref 0.0–0.1)
Basophils Relative: 0 %
Eosinophils Absolute: 0.1 10*3/uL (ref 0.0–0.5)
Eosinophils Relative: 1 %
HCT: 23.1 % — ABNORMAL LOW (ref 39.0–52.0)
Hemoglobin: 7 g/dL — ABNORMAL LOW (ref 13.0–17.0)
Immature Granulocytes: 1 %
Lymphocytes Relative: 5 %
Lymphs Abs: 0.6 10*3/uL — ABNORMAL LOW (ref 0.7–4.0)
MCH: 28.5 pg (ref 26.0–34.0)
MCHC: 30.3 g/dL (ref 30.0–36.0)
MCV: 93.9 fL (ref 80.0–100.0)
Monocytes Absolute: 0.9 10*3/uL (ref 0.1–1.0)
Monocytes Relative: 8 %
Neutro Abs: 9.6 10*3/uL — ABNORMAL HIGH (ref 1.7–7.7)
Neutrophils Relative %: 85 %
Platelets: 240 10*3/uL (ref 150–400)
RBC: 2.46 MIL/uL — ABNORMAL LOW (ref 4.22–5.81)
RDW: 17.5 % — ABNORMAL HIGH (ref 11.5–15.5)
WBC: 11.3 10*3/uL — ABNORMAL HIGH (ref 4.0–10.5)
nRBC: 0.2 % (ref 0.0–0.2)

## 2020-06-22 LAB — RENAL FUNCTION PANEL
Albumin: 2.5 g/dL — ABNORMAL LOW (ref 3.5–5.0)
Anion gap: 14 (ref 5–15)
BUN: 66 mg/dL — ABNORMAL HIGH (ref 6–20)
CO2: 23 mmol/L (ref 22–32)
Calcium: 9 mg/dL (ref 8.9–10.3)
Chloride: 94 mmol/L — ABNORMAL LOW (ref 98–111)
Creatinine, Ser: 8.89 mg/dL — ABNORMAL HIGH (ref 0.61–1.24)
GFR calc Af Amer: 7 mL/min — ABNORMAL LOW (ref >60)
GFR calc non Af Amer: 6 mL/min — ABNORMAL LOW (ref >60)
Glucose, Bld: 180 mg/dL — ABNORMAL HIGH (ref 70–99)
Phosphorus: 5.3 mg/dL — ABNORMAL HIGH (ref 2.5–4.6)
Potassium: 5 mmol/L (ref 3.5–5.1)
Sodium: 131 mmol/L — ABNORMAL LOW (ref 135–145)

## 2020-06-22 LAB — HEMOGLOBIN AND HEMATOCRIT, BLOOD
HCT: 24.1 % — ABNORMAL LOW (ref 39.0–52.0)
Hemoglobin: 7.5 g/dL — ABNORMAL LOW (ref 13.0–17.0)

## 2020-06-22 LAB — GLUCOSE, CAPILLARY
Glucose-Capillary: 109 mg/dL — ABNORMAL HIGH (ref 70–99)
Glucose-Capillary: 180 mg/dL — ABNORMAL HIGH (ref 70–99)
Glucose-Capillary: 272 mg/dL — ABNORMAL HIGH (ref 70–99)
Glucose-Capillary: 139 mg/dL — ABNORMAL HIGH (ref 70–99)

## 2020-06-22 LAB — PREPARE RBC (CROSSMATCH)

## 2020-06-22 LAB — ABO/RH: ABO/RH(D): A POS

## 2020-06-22 MED ORDER — SODIUM CHLORIDE 0.9% IV SOLUTION: Freq: Once | INTRAVENOUS | Status: DC

## 2020-06-22 MED ORDER — DARBEPOETIN ALFA 150 MCG/0.3ML IJ SOSY
PREFILLED_SYRINGE | INTRAMUSCULAR | Status: AC
  Administered 2020-06-22: 150 ug via INTRAVENOUS
  Filled 2020-06-22: qty 0.3

## 2020-06-22 MED ORDER — HEPARIN SODIUM (PORCINE) 1000 UNIT/ML IJ SOLN
INTRAMUSCULAR | Status: AC
  Administered 2020-06-22: 3800 [IU]
  Filled 2020-06-22: qty 4

## 2020-06-22 MED ORDER — DOXERCALCIFEROL 4 MCG/2ML IV SOLN
INTRAVENOUS | Status: AC
  Administered 2020-06-22: 4 ug
  Filled 2020-06-22: qty 2

## 2020-06-22 NOTE — Procedures (Signed)
I was present at this dialysis session. I have reviewed the session itself and made appropriate changes.   Vital signs in last 24 hours:  Temp:  [98 F (36.7 C)-98.6 F (37 C)] 98 F (36.7 C) (07/10 0635) Pulse Rate:  [89-104] 91 (07/10 0635) Resp:  [16-19] 18 (07/10 0635) BP: (102-134)/(62-93) 102/62 (07/10 0635) SpO2:  [93 %-100 %] 93 % (07/10 0635) Weight change:  Filed Weights   06/20/20 0651 06/20/20 1100 06/20/20 2052  Weight: (!) 155.1 kg (!) 149.7 kg (!) 149.7 kg    Recent Labs  Lab 06/20/20 0714  NA 132*  K 4.8  CL 97*  CO2 22  GLUCOSE 174*  BUN 82*  CREATININE 9.59*  CALCIUM 8.9  PHOS 4.8*    Recent Labs  Lab 06/17/20 0432 06/18/20 0643 06/20/20 0714  WBC 10.2 7.5 8.9  NEUTROABS 8.2* 6.0  --   HGB 7.3* 7.0* 7.0*  HCT 24.1* 23.7* 23.0*  MCV 94.5 92.9 94.3  PLT 252 255 246    Scheduled Meds: . amiodarone  200 mg Oral Daily  . amLODipine  10 mg Oral Daily  . apixaban  5 mg Oral BID  . ARIPiprazole  5 mg Oral Daily  . atorvastatin  40 mg Oral Daily  . carvedilol  12.5 mg Oral BID  . Chlorhexidine Gluconate Cloth  6 each Topical Q0600  . Chlorhexidine Gluconate Cloth  6 each Topical Q0600  . darbepoetin (ARANESP) injection - DIALYSIS  150 mcg Intravenous Q Sat-HD  . diclofenac Sodium  4 g Topical QID  . doxercalciferol  4 mcg Oral Q T,Th,Sa-HD  . gabapentin  100 mg Oral Q T,Th,Sat-1800  . hydrALAZINE  100 mg Oral TID  . insulin aspart  0-15 Units Subcutaneous TID WC  . insulin aspart  0-5 Units Subcutaneous QHS  . insulin glargine  30 Units Subcutaneous Daily  . lidocaine  1 patch Transdermal Q24H  . linezolid  600 mg Oral Q12H  . melatonin  5 mg Oral QHS  . pantoprazole  40 mg Oral Daily  . polyethylene glycol  17 g Oral Daily  . sevelamer carbonate  1,600 mg Oral TID with meals  . sodium chloride flush  10-40 mL Intracatheter Q12H  . tamsulosin  0.4 mg Oral Daily   Continuous Infusions: . DAPTOmycin (CUBICIN)  IV    . DAPTOmycin (CUBICIN)   IV 800 mg (06/20/20 2100)   PRN Meds:.acetaminophen, cyclobenzaprine, sodium chloride flush, traMADol   Lucas Potts,  MD 06/22/2020, 7:45 AM

## 2020-06-22 NOTE — Progress Notes (Signed)
Patient refuses daily dressing on Right stump and buttocks.

## 2020-06-22 NOTE — Plan of Care (Signed)
  Problem: Health Behavior/Discharge Planning: Goal: Ability to manage health-related needs will improve Outcome: Progressing   

## 2020-06-22 NOTE — Progress Notes (Signed)
Family Medicine Teaching Service Daily Progress Note Intern Pager: [phone]  Patient name: Lucas [name] Medical record number: [id number] Date of birth: 03/16/1971 Age: 49 y.o. Gender: male  Primary Care Provider: System, Pcp Not In Consultants: Nephrology, cardiology, infectious disease Code Status: Full code  Pt Overview and Major Events to Date:  Admitted for hyperglycemia and port pain on 06/09/2020 Enterococcus grew on blood cultures on 06/10/2020 TTE demonstrated mitral valve vegetation on 06/11/2020 Removal of right IJ HD catheter on 06/11/2020 TEE completed on 06/13/2020 Patient received left IJ nontunneled, restarted HD on 06/14/2020 Patient received long-term tunneled left IJ on 06/19/2020  Antibiotics Patient's current antibiotics and duration listed below. Gentamicin 6/29-6/30 Vancomycin 6/28-6/29 Linezolid 7/1-7/28 Daptomycin 7/1-8/9  Assessment and Plan: [name] a 49 y.o.malewith Enterococcus endocarditis. PMH is significant foratrial flutter s/p ablation, T2DM, ESRD, hyperlipidemia, anemia, CHF, GERD, polysubstance abuse and homelessness.  Enterococcus endocarditis Patienthas been compliant with his treatment thus far. He recognizes that he may be in the hospital for an extended period. CVTS noted that he is not a good surgical candidate due to his significant comorbidities and current social situation. They advised following up in clinic following completion of his antibiotic course. -Nephrologyfollowing, patient stable for discharge from renal standpoint -Infectious disease sign off, 7/3, will f/u with patient 7/26 -Continue Linezolid PO  day9/28 -Continue IV daptomycin day9 /42,consult pharmacy for dosing with HD -weekly labs: CBC w/ diff, BMP, CK -Follow-up on repeat blood cultures from after his line was pulled - patient most recent Hgb 7 from 7.9.2021, will not transfuse at this time. Hgb remains stable on repeat CBC. Patient reported that he bled a lot  after the procedure prior to IR suturing the area at the bedside.  Infected dialysis catheter Following a line holiday, and new left nontunneled IJ catheter was placed on 7/2.New IJ tunneled catheter placed 7/7, patient had bleeding after the surgery. IR sutured with dissolvable sutures at the bedside, no f/u needed. Patient most recent Hgb 7 from 06/21/2020. Patient is scheduled for Aranesp q Saturday at HD. -Continue to monitor catheter site -Back on TTS schedule for dialysis -Tramadol twice daily as needed for pain -Flexeril 5mg  q 8hrs PRN for muscle pain - Awaiting CBC results  Right chest/neck pain Stable.  Patient noticed some right chest/neck pain since his admission. Patientpain appears to be managed well with voltaren gel, no overnight calls.His chest pain has not bothered him for a few days and denies chest pain today.We will continue to monitor as X ray has a low sensitivity for osteomyelitis and therefore it cannot be completly ruled out. CT could also be used to detect septic joint. PT recommends outpatient PT, 3x/ week. -Continue to monitor - Flexeril 5mg  q 8hrs PRN for likely muscle pain - May consider CT of shoulder if pain continues  - Voltaren gel 4x daily scheduled - Continue PT for shoulder  -Include PT recommendation of outpatient physical therapy 3 times a week in discharge summary  Right forearm swelling Stable.  Patient has not reported increased pain or swelling in the arm.Possible thrombus in the right upper extremityhas been ruled out.  -VENOUSVAS US of the upper extremities found no evidence of DVT in the UE, no evidence of superficial vein thrombosis, no evidence of thrombosis in the subclavian. - Will continue to monitor -Eliquis restarted  Left bicep pain Patient has previously reported that he began having pain in his medial, left bicep after procedure but denies experiencing any pain currently. The pain is constant,  but is worse with  movement. The pain follows a cord-like pattern. The patient also reports that he feels more short of breath after his surgery. -Performed venous US of L upper extremity which demonstrated no evidence of DVT or superficial vein thrombosis in the left upper extremity.  Type 2 diabetes Blood glucose 109. Most recent Hgb A1c 11.5 on 06/09/2020.  30units glargine, 5units aspart given the past 24 hours. Patient is hyperglycemic and is ordering outside meals. we will readjust insulin regimens as needed.   Ordering porosthetic/orthotic device requested by PT. -ContinueLantus30 U -SSI moderate -At bedtime coverage - Continue gabapentin for diabetic neuropathy - Placed order for bilateral lower extremity prothstetics -Consider encouraging patient to follow-up with PCP regarding proper diabetes management within the outpatient setting  Hypertension Patient's current BP is 102/62.  He denies chest pain or dyspnea.  This is the lowest systolic BP noted as systolic blood pressures have previously ranged between 115 and 141. -Continue amlodipine 10 mg and hydralazine 100 mg tid daily -Continue to monitor vitals including BP   Atrial flutter s/p ablation Patient's home medication include amiodarone and apixaban -Continue amiodarone - Continue Eliquis -Continuous cardiac monitoring  Hyperlipidemia -Atorvastatin 40 mg -Consider discussion with patient on importance of relative measures by incorporating appropriate lifestyle modifications including healthy diet and adequate physical activity to work in conjunction with current pharmacologic therapy.  Bipolar disorder Patient currently denies any symptoms including racing thoughts, mood is appropriate. -Abilify 5 mg daily  CHF -Continue to monitor fluid status -Dialysis TTS  GERD -Patient denies any reflux and epigastric pain at this time. -Continue Protonix  Polysubstance abuse We will ensure that he is accompaniedwhenhe is  allotted time outside. Nurse found patient ingesting "chalk." Bag was still in the room, most of the bag was in [place], but it was determined that the chalk was actually clay, which the patient confirmed. Patient reportedthat this is a cultural remedy for ingesting Vitamins. Patient was under the impression that the chalk was from [facility] where his family is from and had previously participated in the practice of ingesting chalk for nutrients. The bag has been removed from the patient's room and placed with his possessions.  - Continue to monitor patient  Hx ofHomelessness Per chart review patient has history of homelessness. Patient reports that heisnot homeless at this time and that he is moving back to [facility] soon. Patient does acknowledge that he missed his dialysis appointment because his ride to the dialysis appointment fell through. -Social work consulted for confirmation that patient has stable living situation  Buttocks wound Patient denies any complaints at this time.   -We will continue to monitor for any changes in symptoms    FEN/GI: Renal carb modified PPx: Eliquis  Disposition: Inpatient  Subjective:  Patient was seen and examined at bedside. He was sleeping and after waking up expressed that he is having occasional dyspnea that occurs both at rest and with exertion. Patient denies nausea, vomiting, chest pain and generalized pain. He had no overnight events and has no complaints at this time.  Objective: Temp:  [98.3 F (36.8 C)-98.6 F (37 C)] 98.6 F (37 C) (07/09 2119) Pulse Rate:  [89-104] 104 (07/09 2307) Resp:  [16-19] 19 (07/09 2119) BP: (119-134)/(78-93) 134/78 (07/09 2119) SpO2:  [97 %-100 %] 100 % (07/09 2307) Physical Exam: General: Patient in no acute distress Cardiovascular: regular rate and rhythm, no murmurs or gallops appreciated Respiratory: lungs clear to auscultation bilaterally, no wheezing or rhonchi noted Abdomen:  Nontender upon  palpation, active bowel sounds  Derm: warm and dry to touch Psych: mood appropriate, no agitation noted  Laboratory: Recent Labs  Lab 06/17/20 0432 06/18/20 0643 06/20/20 0714  WBC 10.2 7.5 8.9  HGB 7.3* 7.0* 7.0*  HCT 24.1* 23.7* 23.0*  PLT 252 255 246   Recent Labs  Lab 06/17/20 0432 06/18/20 0643 06/20/20 0714  NA 131* 129* 132*  K 4.2 4.4 4.8  CL 95* 93* 97*  CO2 24 23 22   BUN 44* 54* 82*  CREATININE 6.80* 7.69* 9.59*  CALCIUM 8.8* 8.8* 8.9  GLUCOSE 223* 301* 174*      Imaging/Diagnostic Tests: VAS [place] UPPER EXTREMITY VENOUS DUPLEX  Result Date: 06/21/2020 UPPER VENOUS STUDY  Indications: Pain, and new IJ TDC Comparison Study: Prior RT UE Venous 06-16-2020 Performing Technologist: Darlin Coco  Examination Guidelines: A complete evaluation includes B-mode imaging, spectral Doppler, color Doppler, and power Doppler as needed of all accessible portions of each vessel. Bilateral testing is considered an integral part of a complete examination. Limited examinations for reoccurring indications may be performed as noted.  Right Findings: +----------+------------+---------+-----------+----------+-------+ RIGHT     CompressiblePhasicitySpontaneousPropertiesSummary +----------+------------+---------+-----------+----------+-------+ Subclavian               Yes       Yes                      +----------+------------+---------+-----------+----------+-------+  Left Findings: +----------+------------+---------+-----------+----------+-------+ LEFT      CompressiblePhasicitySpontaneousPropertiesSummary +----------+------------+---------+-----------+----------+-------+ IJV                      Yes       Yes                      +----------+------------+---------+-----------+----------+-------+ Subclavian    Full       Yes       Yes                      +----------+------------+---------+-----------+----------+-------+ Axillary      Full       Yes       Yes                       +----------+------------+---------+-----------+----------+-------+ Brachial      Full       Yes       Yes                      +----------+------------+---------+-----------+----------+-------+ Radial        Full                                          +----------+------------+---------+-----------+----------+-------+ Ulnar         Full                                          +----------+------------+---------+-----------+----------+-------+ Cephalic      Full                                          +----------+------------+---------+-----------+----------+-------+ Basilic       Full                                          +----------+------------+---------+-----------+----------+-------+  Summary:  Right: No evidence of thrombosis in the subclavian.  Left: No evidence of deep vein thrombosis in the upper extremity. No evidence of superficial vein thrombosis in the upper extremity.  *See table(s) above for measurements and observations.  Diagnosing physician: Harold Barban MD Electronically signed by Harold Barban MD on 06/21/2020 at 5:17:59 PM.    Final     Donney Dice, DO 06/22/2020, 2:01 AM PGY-1, [place] Intern pager: [phone], text pages welcome

## 2020-06-22 NOTE — Progress Notes (Signed)
Pt keeps asking to go off of unit, although pt has already been off of the unit on day shift. Pt states that he was only outside for 10 minutes earlier and that his "psychie" cant take it. RN told pt that it was dark outside and that she did not feel comfortable with him going. RN spoke with charge RN and she stated that pt could go down for 10 minutes with the nurse tech, as long as she was available to go and was not busy. NT stated she could take pt. Pt is getting his outside break at this time.   Eleanora Neighbor, RN

## 2020-06-22 NOTE — Plan of Care (Signed)
  Problem: Clinical Measurements: Goal: Ability to maintain clinical measurements within normal limits will improve Outcome: Progressing   Problem: Coping: Goal: Level of anxiety will decrease Outcome: Progressing   Problem: Pain Managment: Goal: General experience of comfort will improve Outcome: Progressing   Problem: Safety: Goal: Ability to remain free from injury will improve Outcome: Progressing   

## 2020-06-23 DIAGNOSIS — R0789 Other chest pain: Secondary | ICD-10-CM

## 2020-06-23 LAB — TYPE AND SCREEN
ABO/RH(D): A POS
Antibody Screen: NEGATIVE
Unit division: 0

## 2020-06-23 LAB — GLUCOSE, CAPILLARY
Glucose-Capillary: 147 mg/dL — ABNORMAL HIGH (ref 70–99)
Glucose-Capillary: 176 mg/dL — ABNORMAL HIGH (ref 70–99)
Glucose-Capillary: 172 mg/dL — ABNORMAL HIGH (ref 70–99)
Glucose-Capillary: 129 mg/dL — ABNORMAL HIGH (ref 70–99)

## 2020-06-23 LAB — BPAM RBC
Blood Product Expiration Date: 202107282359
ISSUE DATE / TIME: 202107101109
Unit Type and Rh: 6200

## 2020-06-23 MED ORDER — ACETAMINOPHEN 500 MG PO TABS
1000.0000 mg | ORAL_TABLET | Freq: Four times a day (QID) | ORAL | Status: DC
  Administered 2020-06-24 – 2020-07-07 (×26): 1000 mg via ORAL
  Filled 2020-06-23 (×32): qty 2

## 2020-06-23 MED ORDER — OXYCODONE-ACETAMINOPHEN 5-325 MG PO TABS
1.0000 | ORAL_TABLET | Freq: Once | ORAL | Status: AC
  Administered 2020-06-23: 1 via ORAL
  Filled 2020-06-23: qty 1

## 2020-06-23 MED ORDER — QUETIAPINE FUMARATE 100 MG PO TABS: 200.0000 mg | ORAL_TABLET | Freq: Every day | ORAL | Status: DC

## 2020-06-23 MED ORDER — QUETIAPINE FUMARATE 100 MG PO TABS
100.0000 mg | ORAL_TABLET | Freq: Every day | ORAL | Status: DC
  Administered 2020-06-23 – 2020-07-06 (×14): 100 mg via ORAL
  Filled 2020-06-23 (×14): qty 1

## 2020-06-23 MED ORDER — OXYCODONE HCL 5 MG PO TABS
5.0000 mg | ORAL_TABLET | Freq: Once | ORAL | Status: AC | PRN
  Administered 2020-06-23: 5 mg via ORAL
  Filled 2020-06-23: qty 1

## 2020-06-23 NOTE — Progress Notes (Signed)
Patient ID: Lucas [name], male   DOB: 22-Oct-1971, 49 y.o.   MRN: [id number] S: Complaining of pain over his old right IJ TDC site and increased swelling. O:BP 134/78 (BP Location: Left Arm)   Pulse 94   Temp (!) 97.5 F (36.4 C) (Oral)   Resp 20   Ht 6\' 4"  (1.93 m)   Wt (!) 149.7 kg   SpO2 100%   BMI 40.17 kg/m   Intake/Output Summary (Last 24 hours) at 06/23/2020 1116 Last data filed at 06/23/2020 0900 Gross per 24 hour  Intake 1500 ml  Output 4000 ml  Net -2500 ml   Intake/Output: I/O last 3 completed shifts: In: 1778 [P.O.:1778] Out: 4000 [Other:4000]  Intake/Output this shift:  Total I/O In: 300 [P.O.:300] Out: 0  Weight change:  Gen: NAD CVS: no rub Resp: cta Abd: distended, + edema Ext: s/p bilateral BKAs with 1+ edema  Recent Labs  Lab 06/17/20 0432 06/18/20 0643 06/20/20 0714 06/22/20 1257  NA 131* 129* 132* 131*  K 4.2 4.4 4.8 5.0  CL 95* 93* 97* 94*  CO2 24 23 22 23   GLUCOSE 223* 301* 174* 180*  BUN 44* 54* 82* 66*  CREATININE 6.80* 7.69* 9.59* 8.89*  ALBUMIN  --   --  2.4* 2.5*  CALCIUM 8.8* 8.8* 8.9 9.0  PHOS  --   --  4.8* 5.3*   Liver Function Tests: Recent Labs  Lab 06/20/20 0714 06/22/20 1257  ALBUMIN 2.4* 2.5*   No results for input(s): LIPASE, AMYLASE in the last 168 hours. No results for input(s): AMMONIA in the last 168 hours. CBC: Recent Labs  Lab 06/17/20 0432 06/17/20 0432 06/18/20 0643 06/18/20 0643 06/20/20 0714 06/22/20 0756 06/22/20 1634  WBC 10.2   < > 7.5  --  8.9 11.3*  --   NEUTROABS 8.2*  --  6.0  --   --  9.6*  --   HGB 7.3*   < > 7.0*   < > 7.0* 7.0* 7.5*  HCT 24.1*   < > 23.7*   < > 23.0* 23.1* 24.1*  MCV 94.5  --  92.9  --  94.3 93.9  --   PLT 252   < > 255  --  246 240  --    < > = values in this interval not displayed.   Cardiac Enzymes: Recent Labs  Lab 06/17/20 0432  CKTOTAL 78   CBG: Recent Labs  Lab 06/22/20 1246 06/22/20 1622 06/22/20 2131 06/23/20 0657 06/23/20 1104  GLUCAP 180* 272*  139* 147* 176*    Iron Studies:  Recent Labs    06/21/20 1238 06/21/20 1238 06/21/20 1429  IRON 30*   < > 28*  TIBC 188*   < > 195*  FERRITIN 561*  --   --    < > = values in this interval not displayed.   Studies/Results: No results found. . sodium chloride   Intravenous Once  . amiodarone  200 mg Oral Daily  . amLODipine  10 mg Oral Daily  . apixaban  5 mg Oral BID  . ARIPiprazole  5 mg Oral Daily  . atorvastatin  40 mg Oral Daily  . carvedilol  12.5 mg Oral BID  . Chlorhexidine Gluconate Cloth  6 each Topical Q0600  . Chlorhexidine Gluconate Cloth  6 each Topical Q0600  . darbepoetin (ARANESP) injection - DIALYSIS  150 mcg Intravenous Q Sat-HD  . diclofenac Sodium  4 g Topical QID  . doxercalciferol  4 mcg Oral  Q T,Th,Sa-HD  . gabapentin  100 mg Oral Q T,Th,Sat-1800  . hydrALAZINE  100 mg Oral TID  . insulin aspart  0-15 Units Subcutaneous TID WC  . insulin aspart  0-5 Units Subcutaneous QHS  . insulin glargine  30 Units Subcutaneous Daily  . lidocaine  1 patch Transdermal Q24H  . linezolid  600 mg Oral Q12H  . melatonin  5 mg Oral QHS  . pantoprazole  40 mg Oral Daily  . polyethylene glycol  17 g Oral Daily  . QUEtiapine  100 mg Oral QHS  . sevelamer carbonate  1,600 mg Oral TID with meals  . sodium chloride flush  10-40 mL Intracatheter Q12H  . tamsulosin  0.4 mg Oral Daily    BMET    Component Value Date/Time   NA 131 (L) 06/22/2020 1257   K 5.0 06/22/2020 1257   CL 94 (L) 06/22/2020 1257   CO2 23 06/22/2020 1257   GLUCOSE 180 (H) 06/22/2020 1257   BUN 66 (H) 06/22/2020 1257   CREATININE 8.89 (H) 06/22/2020 1257   CALCIUM 9.0 06/22/2020 1257   GFRNONAA 6 (L) 06/22/2020 1257   GFRAA 7 (L) 06/22/2020 1257   CBC    Component Value Date/Time   WBC 11.3 (H) 06/22/2020 0756   RBC 2.46 (L) 06/22/2020 0756   HGB 7.5 (L) 06/22/2020 1634   HCT 24.1 (L) 06/22/2020 1634   PLT 240 06/22/2020 0756   MCV 93.9 06/22/2020 0756   MCH 28.5 06/22/2020 0756   MCHC  30.3 06/22/2020 0756   RDW 17.5 (H) 06/22/2020 0756   LYMPHSABS 0.6 (L) 06/22/2020 0756   MONOABS 0.9 06/22/2020 0756   EOSABS 0.1 06/22/2020 0756   BASOSABS 0.0 06/22/2020 0756   [facility] unit [facility] [place] [facility]) Phone [phone] Scheduled TTS 4 hours His outpatient unit reports noncompliance BF 400; DF 800 F250 dialyzer  2/2.5 ca bath EDW 250 lbs Last weight 272.2 lbs on 6/24 (signed off early and had skipped the treatment prior) aranesp 120 mcg weekly  hectoral 4 mcg each tx  Assessment/Plan:  1. Enterococcus bacteremia/Mitral valve Endocarditis- due to tunneled HD catheter. TDC removed 6/29 s/p temp HD catheter placed 06/14/20.Now with new LIJ TDC placed today. Will continue withIVcubicin andoralzyvoxper ID 1. Not candidate for surgery due to multiple co-morbidities, social issues, and polysubstance abuse 2. Noted vegetation on TTE 6/29 3. daptomycin through 07/22/20 and oral linezolid for 4 weeks (his home HD unit does have daptomycin) 4. Follow up with ID on 07/08/20 2. ESRDcontinue with TTS schedule and plan for HD after catheter placed by IR on 06/18/20.  1. Still with significant edema, will plan for an extra HD session tomorrow for UF. 3. Mitral valve endocarditis- noted on TTE 6/29. ID following. Daptomycin for 6 weeks through 07/22/20, oral linezolid for 4 weeks, follow up with ID on 07/08/20. 4. Anemia:due to ESRD. Continue with ESA 5. CKD-MBD:continue with home meds 6. Nutrition:renal diet 7. Hypertension:stable 8. Vascular access-s/ptunneled HD catheter per IR7/6/21. 9. Disposition- SW to assist with transportation to his outpatient unit.Stable for discharge from renal standpoint, however there appears to be issues with disposition.   Donetta Potts, MD [employer] [phone]

## 2020-06-23 NOTE — Progress Notes (Signed)
Pt reports pain medication not effective MD notified, awaiting new orders

## 2020-06-23 NOTE — Progress Notes (Signed)
Received page [phone] regarding right arm pain.    Lucas [name] was found to be sitting on the edge of the bed upon entrance to the room.  He was awake, alert, and conversational.  He said that he has had right chest and shoulder pain over the site where his old port was taken out.  He has had this pain since he got here.  Of note, HD cath was taken out on Tuesday 7/6.  He says that this pain starts in his right lateral chest and goes through to his shoulder.  He says his shoulder range of motion is limited by his pain, he cannot lift his right arm up past his shoulder.  He has lidocaine gel, diclofenac gel, Tylenol, Flexeril, tramadol all scheduled as needed.  He has not used any of these today, saying they do not work.  He says the lidocaine gel and diclofenac gel actually make the pain worse.  He is asking for "something a little bit stronger".  He reports another doctor told him it might be an abscess because the pain and swelling is "traveling around the old port site".   Today's Vitals   06/22/20 1917 06/22/20 2130 06/23/20 0522 06/23/20 0800  BP:  126/81 134/78   Pulse:  99 94   Resp:  17 20   Temp:  99.3 F (37.4 C) (!) 97.5 F (36.4 C)   TempSrc:  Oral Oral   SpO2:  100% 100%   Weight:      Height:      PainSc: 0-No pain   0-No pain   Old port site on right chest covered with Band-Aid.  Mild swelling superiorly and medially appreciated.  Skin is warm to the touch, but does not feel warmer than surrounding areas.  The swelling is tender to palpation.  Right scapula is palpated, tenderness to palpation in the superior medial aspect.  Right arm range of motion limited by pain, can only raise arm to level of shoulder.   Will recommend conservative measures. Consider repeat imaging if pain and mild swelling change significantly.

## 2020-06-23 NOTE — Progress Notes (Signed)
Pt c/o right chest pain where HD cath was located.  MD made aware.

## 2020-06-23 NOTE — Progress Notes (Signed)
Family Medicine Teaching Service Daily Progress Note Intern Pager: [phone]  Patient name: Lucas [name] Medical record number: [id number] Date of birth: [birth year] Age: 49 y.o. Gender: male  Primary Care Provider: System, Pcp Not In Consultants: Nephrology, cardiology, infectious disease Code Status: Full code  Pt Overview and Major Events to Date:  Admitted for hyperglycemia and port pain on 06/09/2020 Enterococcus grew on blood cultures on 06/10/2020 TTE demonstrated mitral valve vegetation on 06/11/2020 Removal of right IJ HD catheter on 06/11/2020 TEE completed on 06/13/2020 Patient received left IJ nontunneled, restarted HD on 06/14/2020 Patient received long-term tunneled left IJ on 06/19/2020  Antibiotics Patient's current antibiotics and duration listed below. Gentamicin 6/29-6/30 Vancomycin 6/28-6/29 Linezolid 7/1-7/28 Daptomycin 7/1-8/9  Assessment and Plan: Lucas [name] a 49 y.o.malewith Enterococcus endocarditis. PMH is significant foratrial flutter s/p ablation, T2DM, ESRD, hyperlipidemia, anemia, CHF, GERD, polysubstance abuse and homelessness.  Enterococcus endocarditis Patienthas been compliant with his treatment. He recognizes that he may be in the hospital for an extended period. CVTS noted that he is not a good surgical candidate due to his significant comorbidities and current social situation. They advised following up in clinic following completion of his antibiotic course. -Nephrologyfollowing, patient stable for discharge from renal standpoint -Infectious disease sign off, 7/3, will f/u with patient 7/26 -Continue Linezolid PO day9/28 -Continue IV daptomycin day9/42,consult pharmacy for dosing with HD -weekly labs: CBC w/ diff, BMP, CK -Repeat blood cultures from after his line was pulled - No Growth at 5 days  Anemia, stable, improving Most recent Hgb 7.5 (7/10) improved from 7.0 (7/9) - Will not transfuse at this time - Continue to monitor  -  Patient is scheduled for Aranesp q Saturday at HD.  Infected dialysis catheter Following a line holiday, and new left nontunneled IJ catheter was placed on 7/2.New IJ tunneled catheter placed 7/7, patient had bleeding after the surgery. IR sutured with dissolvable sutures at the bedside, no f/u needed.  - Continue to monitor catheter site - HD TTS - Tramadol twice daily as needed for pain - Flexeril 5mg  q 8hrs PRN for muscle pain - Awaiting CBC results  Right chest/neck pain, chronic, stable Patientpain appears to be managed well with voltaren gel. Will continue to monitor as X ray has a low sensitivity for osteomyelitis and therefore it cannot be completly ruled out. CT could also be used to detect septic joint. PT recommends outpatient PT, 3x/ week. - Continue to monitor - Flexeril 5mg  q 8hrs PRN for likely muscle pain - May consider CT of shoulder if pain continues  - Voltaren gel 4x daily scheduled - Continue PT for shoulder  - Include PT recommendation of outpatient physical therapy 3 times a week in discharge summary  Right forearm swelling, stable Patient has not reported increased pain or swelling in the arm.Possible thrombus in the right upper extremityhas been ruled out.  -VENOUSVAS US of the upper extremities found no evidence of DVT in the UE, no evidence of superficial vein thrombosis, no evidence of thrombosis in the subclavian. - Will continue to monitor -Eliquis restarted  Left bicep pain, resolved Patient has previously reported that he began having pain in his medial, left bicep after procedure but denies experiencing any pain currently. The pain is constant, but is worse with movement. The pain follows a cord-like pattern. The patient also reports that he feels more short of breath after his surgery. -Performed venous US of L upper extremity which demonstrated no evidence of DVT or superficial vein thrombosis in the  left upper extremity.  Type 2  diabetes, stable Blood glucose 109. Most recent Hgb A1c 11.5 on 06/09/2020. 30units glargine, mSSI and HS scale. Will readjust insulin regimens as needed. Ordering porosthetic/orthotic device requested by PT.  - ContinueLantus30 U - mSSI, qHS insulin - Continue gabapentin for diabetic neuropathy - Placed order for bilateral lower extremity prothstetics - Consider encouraging patient to follow-up with PCP regarding proper diabetes management within the outpatient setting  Hypertension, stable Patient's current BP is 134/78.  He denies chest pain or dyspnea.   - Continue amlodipine 10 mg and hydralazine 100 mg tid daily - Continue to monitor vitals including BP   Atrial flutter s/p ablation Patient's home medication include amiodarone and apixaban - Continue amiodarone - Continue Eliquis - Continuous cardiac monitoring  Hyperlipidemia - Atorvastatin 40 mg - Consider discussion with patient on importance of relative measures by incorporating appropriate lifestyle modifications including healthy diet and adequate physical activity to work in conjunction with current pharmacologic therapy.  Bipolar disorder Patient currently denies any symptoms including racing thoughts, mood is appropriate. - Abilify 5 mg daily  CHF - Continue to monitor fluid status - Dialysis TTS  GERD - Patient denies any reflux and epigastric pain at this time. - Continue Protonix  Polysubstance abuse We will ensure that he is accompaniedwhenhe is allotted time outside. Nurse found patient ingesting "chalk." Bag was still in the room, most of the bag was in [place], but it was determined that the chalk was actually clay, which the patient confirmed. Patient reportedthat this is a cultural remedy for ingesting Vitamins. Patient was under the impression that the chalk was from [facility] where his family is from and had previously participated in the practice of ingesting chalk for nutrients. The bag has been  removed from the patient's room and placed with his possessions.  - Continue to monitor patient  Hx ofHomelessness Per chart review patient has history of homelessness. Patient reports that heisnot homeless at this time and that he is moving back to [facility] soon. Patient does acknowledge that he missed his dialysis appointment because his ride to the dialysis appointment fell through. - Social work consulted for confirmation that patient has stable living situation  Buttocks wound Patient denies any complaints at this time.   - We will continue to monitor for any changes in symptoms    FEN/GI: Renal carb modified PPx: Eliquis  Disposition: Inpatient  Subjective:  Sleeping comfortably this morning. No events over night.   Objective: Temp:  [97.5 F (36.4 C)-99.3 F (37.4 C)] 97.5 F (36.4 C) (07/11 0522) Pulse Rate:  [88-101] 94 (07/11 0522) Resp:  [17-20] 20 (07/11 0522) BP: (102-140)/(59-90) 134/78 (07/11 0522) SpO2:  [93 %-100 %] 100 % (07/11 0522) Physical Exam: General: NAD Cardiovascular: no murmurs appreciated Respiratory: CTA bilaterally Abdomen: normal bowel sounds Derm: warm and dry to touch Psych: sleepy  Laboratory: Recent Labs  Lab 06/18/20 0643 06/18/20 0643 06/20/20 0714 06/22/20 0756 06/22/20 1634  WBC 7.5  --  8.9 11.3*  --   HGB 7.0*   < > 7.0* 7.0* 7.5*  HCT 23.7*   < > 23.0* 23.1* 24.1*  PLT 255  --  246 240  --    < > = values in this interval not displayed.   Recent Labs  Lab 06/18/20 0643 06/20/20 0714 06/22/20 1257  NA 129* 132* 131*  K 4.4 4.8 5.0  CL 93* 97* 94*  CO2 23 22 23   BUN 54* 82* 66*  CREATININE 7.69* 9.59* 8.89*  CALCIUM 8.8* 8.9 9.0  GLUCOSE 301* 174* 180*      Imaging/Diagnostic Tests: No results found.  Daisy Floro, DO 06/23/2020, 6:16 AM PGY-3, [place] Intern pager: [phone], text pages welcome

## 2020-06-24 DIAGNOSIS — G8929 Other chronic pain: Secondary | ICD-10-CM

## 2020-06-24 DIAGNOSIS — E1121 Type 2 diabetes mellitus with diabetic nephropathy: Secondary | ICD-10-CM

## 2020-06-24 LAB — BASIC METABOLIC PANEL
Anion gap: 16 — ABNORMAL HIGH (ref 5–15)
BUN: 65 mg/dL — ABNORMAL HIGH (ref 6–20)
CO2: 21 mmol/L — ABNORMAL LOW (ref 22–32)
Calcium: 8.9 mg/dL (ref 8.9–10.3)
Chloride: 95 mmol/L — ABNORMAL LOW (ref 98–111)
Creatinine, Ser: 8.06 mg/dL — ABNORMAL HIGH (ref 0.61–1.24)
GFR calc Af Amer: 8 mL/min — ABNORMAL LOW (ref >60)
GFR calc non Af Amer: 7 mL/min — ABNORMAL LOW (ref >60)
Glucose, Bld: 93 mg/dL (ref 70–99)
Potassium: 4.9 mmol/L (ref 3.5–5.1)
Sodium: 132 mmol/L — ABNORMAL LOW (ref 135–145)

## 2020-06-24 LAB — CK: Total CK: 169 U/L (ref 49–397)

## 2020-06-24 LAB — GLUCOSE, CAPILLARY
Glucose-Capillary: 100 mg/dL — ABNORMAL HIGH (ref 70–99)
Glucose-Capillary: 55 mg/dL — ABNORMAL LOW (ref 70–99)
Glucose-Capillary: 116 mg/dL — ABNORMAL HIGH (ref 70–99)
Glucose-Capillary: 157 mg/dL — ABNORMAL HIGH (ref 70–99)
Glucose-Capillary: 152 mg/dL — ABNORMAL HIGH (ref 70–99)

## 2020-06-24 LAB — CBC WITH DIFFERENTIAL/PLATELET
Abs Immature Granulocytes: 0.08 10*3/uL — ABNORMAL HIGH (ref 0.00–0.07)
Basophils Absolute: 0 10*3/uL (ref 0.0–0.1)
Basophils Relative: 0 %
Eosinophils Absolute: 0.1 10*3/uL (ref 0.0–0.5)
Eosinophils Relative: 1 %
HCT: 23.5 % — ABNORMAL LOW (ref 39.0–52.0)
Hemoglobin: 7.3 g/dL — ABNORMAL LOW (ref 13.0–17.0)
Immature Granulocytes: 1 %
Lymphocytes Relative: 6 %
Lymphs Abs: 0.6 10*3/uL — ABNORMAL LOW (ref 0.7–4.0)
MCH: 29.1 pg (ref 26.0–34.0)
MCHC: 31.1 g/dL (ref 30.0–36.0)
MCV: 93.6 fL (ref 80.0–100.0)
Monocytes Absolute: 1 10*3/uL (ref 0.1–1.0)
Monocytes Relative: 10 %
Neutro Abs: 8.2 10*3/uL — ABNORMAL HIGH (ref 1.7–7.7)
Neutrophils Relative %: 82 %
Platelets: 207 10*3/uL (ref 150–400)
RBC: 2.51 MIL/uL — ABNORMAL LOW (ref 4.22–5.81)
RDW: 17.4 % — ABNORMAL HIGH (ref 11.5–15.5)
WBC: 10.1 10*3/uL (ref 4.0–10.5)
nRBC: 0.4 % — ABNORMAL HIGH (ref 0.0–0.2)

## 2020-06-24 MED ORDER — INSULIN GLARGINE 100 UNIT/ML ~~LOC~~ SOLN
20.0000 [IU] | Freq: Every day | SUBCUTANEOUS | Status: DC
  Administered 2020-06-24 – 2020-07-06 (×12): 20 [IU] via SUBCUTANEOUS
  Filled 2020-06-24 (×14): qty 0.2

## 2020-06-24 MED ORDER — OXYCODONE HCL 5 MG PO TABS
5.0000 mg | ORAL_TABLET | Freq: Four times a day (QID) | ORAL | Status: DC | PRN
  Administered 2020-06-24: 5 mg via ORAL
  Filled 2020-06-24: qty 1

## 2020-06-24 MED ORDER — OXYCODONE HCL 5 MG PO TABS
5.0000 mg | ORAL_TABLET | Freq: Once | ORAL | Status: AC
  Administered 2020-06-24: 5 mg via ORAL
  Filled 2020-06-24: qty 1

## 2020-06-24 MED ORDER — GABAPENTIN 300 MG PO CAPS
300.0000 mg | ORAL_CAPSULE | Freq: Every day | ORAL | Status: DC
  Administered 2020-06-24 – 2020-06-30 (×7): 300 mg via ORAL
  Filled 2020-06-24 (×7): qty 1

## 2020-06-24 NOTE — Progress Notes (Signed)
Physical Therapy Treatment Patient Details Name: Lucas [name] MRN: [id number] DOB: Jan 08, 1971 Today's Date: 06/24/2020    History of Present Illness Lucas [name] is a 49 y.o. male with Enterococcus endocarditis. PMH is significant for atrial flutter s/p ablation, T2DM, ESRD, hyperlipidemia, anemia, CHF, GERD, polysubstance abuse, homelessness.    PT Comments    Pt was seen for strengthening ex's to BLE's due to being tired and declining OOB.  His sliding transfers are S level, and was able to reposition on the bed without assistance.  Follow up to work on fitting of prosthesis when available.  Follow Up Recommendations  Outpatient PT     Equipment Recommendations  3in1 (PT)    Recommendations for Other Services Other (comment)     Precautions / Restrictions Precautions Precautions: Fall Required Braces or Orthoses: Other Brace Other Brace: working on prosthesis for LLE Restrictions Weight Bearing Restrictions: No    Mobility  Bed Mobility Overal bed mobility: Modified Independent                Transfers                 General transfer comment: none this session  Ambulation/Gait                 Stairs             Wheelchair Mobility    Modified Rankin (Stroke Patients Only)       Balance                                            Cognition Arousal/Alertness: Awake/alert Behavior During Therapy: WFL for tasks assessed/performed Overall Cognitive Status: Within Functional Limits for tasks assessed                                 General Comments: pt is motivated and discussing his goals      Exercises Total Joint Exercises Bridges: Landscape architect Exercises - Lower Extremity Quad Sets: Strengthening;10 reps Gluteal Sets: Strengthening;10 reps Long Arc Quad: Strengthening;10 reps Heel Slides: Strengthening;10 reps Hip ABduction/ADduction: Strengthening;20 reps Straight Leg Raises:  Strengthening;10 reps Hip Flexion/Marching: AROM;10 reps    General Comments        Pertinent Vitals/Pain Pain Assessment: No/denies pain    Home Living                      Prior Function            PT Goals (current goals can now be found in the care plan section) Acute Rehab PT Goals Patient Stated Goal: to get prothestics for both legs Progress towards PT goals: Progressing toward goals    Frequency    Min 3X/week      PT Plan Current plan remains appropriate    Co-evaluation              AM-PAC PT "6 Clicks" Mobility   Outcome Measure  Help needed turning from your back to your side while in a flat bed without using bedrails?: None Help needed moving from lying on your back to sitting on the side of a flat bed without using bedrails?: None Help needed moving to and from a bed to a chair (including a wheelchair)?: A Little Help needed standing up from a chair  using your arms (e.g., wheelchair or bedside chair)?: Total Help needed to walk in hospital room?: Total Help needed climbing 3-5 steps with a railing? : Total 6 Click Score: 14    End of Session Equipment Utilized During Treatment: Other (comment) Activity Tolerance: Patient tolerated treatment well Patient left: in bed;with call bell/phone within reach Nurse Communication: Mobility status PT Visit Diagnosis: Other abnormalities of gait and mobility (R26.89);Muscle weakness (generalized) (M62.81);Pain Pain - Right/Left: Right Pain - part of body: Shoulder     Time: 7035-0093 PT Time Calculation (min) (ACUTE ONLY): 27 min  Charges:  $Therapeutic Exercise: 23-37 mins                Ramond Dial 06/24/2020, 11:55 PM  Mee Hives, PT MS Acute Rehab Dept. Number: [place]

## 2020-06-24 NOTE — Plan of Care (Signed)
  Problem: Safety: Goal: Ability to remain free from injury will improve Outcome: Progressing   Problem: Skin Integrity: Goal: Risk for impaired skin integrity will decrease Outcome: Progressing   

## 2020-06-24 NOTE — Progress Notes (Signed)
This RN along with Shela Commons, RN obtained this patient from Dillon Bjork, RN at approximately 1400. The patient was scheduled for HD today, so per protocol, all daily medications were held. However, some of the bid medications were also held. Verdene Lennert HD RN called at [phone] and stated that patient was unable to get HD today D/T high census. Therefore, patient would be dialyzed tomorrow. Mariea Clonts, RN gave the daily medications, but did not give bid medications because of late timing. Will give bid medications as scheduled tonight.

## 2020-06-24 NOTE — Plan of Care (Signed)
  Problem: Activity: Goal: Risk for activity intolerance will decrease Outcome: Progressing   

## 2020-06-24 NOTE — Progress Notes (Signed)
Inpatient Diabetes Program Recommendations  AACE/ADA: New Consensus Statement on Inpatient Glycemic Control (2015)  Target Ranges:  Prepandial:   less than 140 mg/dL      Peak postprandial:   less than 180 mg/dL (1-2 hours)      Critically ill patients:  140 - 180 mg/dL   Lab Results  Component Value Date   GLUCAP 116 (H) 06/24/2020   HGBA1C 11.5 (H) 06/09/2020    Review of Glycemic Control Results for Lucas [name], Lucas [name] (MRN [id number]) as of 06/24/2020 11:38  Ref. Range 06/23/2020 16:17 06/23/2020 21:07 06/24/2020 06:44 06/24/2020 07:14 06/24/2020 11:20  Glucose-Capillary Latest Ref Range: 70 - 99 mg/dL 172 (H) 129 (H) 55 (L) 100 (H) 116 (H)   Inpatient Diabetes Program Recommendations:  Consider: -Decrease Novolog correction to sensitive tid + hs 0-5 units  Thank you, Bethena Roys E. Posie Lillibridge, RN, MSN, CDE  Diabetes Coordinator Inpatient Glycemic Control Team Team Pager [phone] (8am-5pm) 06/24/2020 11:39 AM

## 2020-06-24 NOTE — Progress Notes (Signed)
Patient ID: Lucas [name], male   DOB: 09-05-1971, 49 y.o.   MRN: [id number] S: C/o orthopnea and feeling 'swollen'  No other issues.  O:BP 120/88 (BP Location: Right Arm)   Pulse 89   Temp 97.7 F (36.5 C) (Oral)   Resp 18   Ht 6\' 4"  (1.93 m)   Wt (!) 149.7 kg   SpO2 98%   BMI 40.17 kg/m   Intake/Output Summary (Last 24 hours) at 06/24/2020 1236 Last data filed at 06/23/2020 2117 Gross per 24 hour  Intake 820 ml  Output 0 ml  Net 820 ml   Intake/Output: I/O last 3 completed shifts: In: 1800 [P.O.:1800] Out: 0   Intake/Output this shift:  No intake/output data recorded. Weight change:  Gen: NAD, periorbital edema noted CVS: no rub Resp: cta Abd: distended, + edema Ext: s/p bilateral BKAs with 1+ edema   Recent Labs  Lab 06/18/20 0643 06/20/20 0714 06/22/20 1257 06/24/20 0434  NA 129* 132* 131* 132*  K 4.4 4.8 5.0 4.9  CL 93* 97* 94* 95*  CO2 23 22 23  21*  GLUCOSE 301* 174* 180* 93  BUN 54* 82* 66* 65*  CREATININE 7.69* 9.59* 8.89* 8.06*  ALBUMIN  --  2.4* 2.5*  --   CALCIUM 8.8* 8.9 9.0 8.9  PHOS  --  4.8* 5.3*  --    Liver Function Tests: Recent Labs  Lab 06/20/20 0714 06/22/20 1257  ALBUMIN 2.4* 2.5*   No results for input(s): LIPASE, AMYLASE in the last 168 hours. No results for input(s): AMMONIA in the last 168 hours. CBC: Recent Labs  Lab 06/18/20 0643 06/18/20 0643 06/20/20 0714 06/20/20 0714 06/22/20 0756 06/22/20 1634 06/24/20 0434  WBC 7.5   < > 8.9  --  11.3*  --  10.1  NEUTROABS 6.0  --   --   --  9.6*  --  8.2*  HGB 7.0*   < > 7.0*   < > 7.0* 7.5* 7.3*  HCT 23.7*   < > 23.0*   < > 23.1* 24.1* 23.5*  MCV 92.9  --  94.3  --  93.9  --  93.6  PLT 255   < > 246  --  240  --  207   < > = values in this interval not displayed.   Cardiac Enzymes: Recent Labs  Lab 06/24/20 0434  CKTOTAL 169   CBG: Recent Labs  Lab 06/23/20 1617 06/23/20 2107 06/24/20 0644 06/24/20 0714 06/24/20 1120  GLUCAP 172* 129* 55* 100* 116*    Iron  Studies:  Recent Labs    06/21/20 1238 06/21/20 1238 06/21/20 1429  IRON 30*   < > 28*  TIBC 188*   < > 195*  FERRITIN 561*  --   --    < > = values in this interval not displayed.   Studies/Results: No results found. . sodium chloride   Intravenous Once  . acetaminophen  1,000 mg Oral Q6H WA  . amiodarone  200 mg Oral Daily  . amLODipine  10 mg Oral Daily  . apixaban  5 mg Oral BID  . ARIPiprazole  5 mg Oral Daily  . atorvastatin  40 mg Oral Daily  . carvedilol  12.5 mg Oral BID  . Chlorhexidine Gluconate Cloth  6 each Topical Q0600  . Chlorhexidine Gluconate Cloth  6 each Topical Q0600  . darbepoetin (ARANESP) injection - DIALYSIS  150 mcg Intravenous Q Sat-HD  . diclofenac Sodium  4 g Topical QID  .  doxercalciferol  4 mcg Oral Q T,Th,Sa-HD  . gabapentin  300 mg Oral QHS  . hydrALAZINE  100 mg Oral TID  . insulin aspart  0-15 Units Subcutaneous TID WC  . insulin aspart  0-5 Units Subcutaneous QHS  . insulin glargine  20 Units Subcutaneous Daily  . lidocaine  1 patch Transdermal Q24H  . linezolid  600 mg Oral Q12H  . melatonin  5 mg Oral QHS  . pantoprazole  40 mg Oral Daily  . polyethylene glycol  17 g Oral Daily  . QUEtiapine  100 mg Oral QHS  . sevelamer carbonate  1,600 mg Oral TID with meals  . sodium chloride flush  10-40 mL Intracatheter Q12H  . tamsulosin  0.4 mg Oral Daily    BMET    Component Value Date/Time   NA 132 (L) 06/24/2020 0434   K 4.9 06/24/2020 0434   CL 95 (L) 06/24/2020 0434   CO2 21 (L) 06/24/2020 0434   GLUCOSE 93 06/24/2020 0434   BUN 65 (H) 06/24/2020 0434   CREATININE 8.06 (H) 06/24/2020 0434   CALCIUM 8.9 06/24/2020 0434   GFRNONAA 7 (L) 06/24/2020 0434   GFRAA 8 (L) 06/24/2020 0434   CBC    Component Value Date/Time   WBC 10.1 06/24/2020 0434   RBC 2.51 (L) 06/24/2020 0434   HGB 7.3 (L) 06/24/2020 0434   HCT 23.5 (L) 06/24/2020 0434   PLT 207 06/24/2020 0434   MCV 93.6 06/24/2020 0434   MCH 29.1 06/24/2020 0434   MCHC  31.1 06/24/2020 0434   RDW 17.4 (H) 06/24/2020 0434   LYMPHSABS 0.6 (L) 06/24/2020 0434   MONOABS 1.0 06/24/2020 0434   EOSABS 0.1 06/24/2020 0434   BASOSABS 0.0 06/24/2020 0434   [facility]) Phone [phone] Scheduled TTS 4 hours His outpatient unit reports noncompliance BF 400; DF 800 F250 dialyzer  2/2.5 ca bath EDW 250 lbs Last weight 272.2 lbs on 6/24 (signed off early and had skipped the treatment prior) aranesp 120 mcg weekly  hectoral 4 mcg each tx  Assessment/Plan:  1. Enterococcus bacteremia/Mitral valve Endocarditis- due to tunneled HD catheter. TDC removed 6/29 s/p temp HD catheter placed 06/14/20.Now with new LIJ [place] placed 7/6. Will continue withIVcubicin andoralzyvoxper ID 1. Not candidate for surgery due to multiple co-morbidities, social issues, and polysubstance abuse 2. Noted vegetation on TTE 6/29 3. daptomycin through 07/22/20 and oral linezolid for 4 weeks (his home HD unit does have daptomycin) 4. Follow up with ID on 07/08/20 2. ESRDcontinue with TTS schedule and plan for HD after catheter placed by IR on 06/18/20.  1. Still with significant edema, will plan for an extra HD session today for UF then resume TTS tomorrow. 3. Mitral valve endocarditis- noted on TTE 6/29. ID following. Daptomycin for 6 weeks through 07/22/20, oral linezolid for 4 weeks, follow up with ID on 07/08/20. 4. Anemia:due to ESRD. Continue with ESA 5. CKD-MBD:continue with home meds 6. Nutrition:renal diet 7. Hypertension:stable 8. Vascular access-s/ptunneled HD catheter per IR7/6/21. 9. Disposition- SW to assist with transportation to his outpatient unit.Stable for discharge from renal standpoint, however there appears to be issues with disposition.   Jannifer Hick MD [facility] Pager [phone]

## 2020-06-24 NOTE — Progress Notes (Signed)
Patient scheduled for an extra hemodialysis treatment today.  Treatment has been cancelled and normal dialysis scheduled will be resume tomorrow.  Primary RN Aneta Mins notified.

## 2020-06-24 NOTE — Progress Notes (Addendum)
Family Medicine Teaching Service Daily Progress Note Intern Pager: [phone]  Patient name: Lucas [name] Medical record number: [id number] Date of birth: May 23, 1971 Age: 49 y.o. Gender: male  Primary Care Provider: System, Pcp Not In Consultants: Nephrology, cardiology, infectious disease Code Status: Full code  Pt Overview and Major Events to Date:  Admitted for hyperglycemia and port pain on 06/09/2020 Enterococcus grew on blood cultures on 06/10/2020 TTE demonstrated mitral valve vegetation on 06/11/2020 Removal of right IJ HD catheter on 06/11/2020 TEE completed on 06/13/2020 Patient received left IJ nontunneled, restarted HD on 06/14/2020 Patient received long-term tunneled left IJ on 06/19/2020  Antibiotics Patient's current antibiotics and duration listed below. Gentamicin 6/29-6/30 Vancomycin 6/28-6/29 Linezolid 7/1-7/28 Daptomycin 7/1-8/9  Assessment and Plan: Lucas [name] a 49 y.o.malewith Enterococcus endocarditis. PMH is significant foratrial flutter s/p ablation, T2DM, ESRD, hyperlipidemia, anemia, CHF, GERD, polysubstance abuse and homelessness.   Enterococcus endocarditis Patienthas been compliant with his treatment thus far. He recognizes that he may be in the hospital for an extended period.CVTS noted that he is not a good surgical candidate due to his significant comorbidities and current social situation. They advised following up in clinic following completion of his antibiotic course. -Nephrologyfollowing, patient stable for discharge from renal standpoint -Infectious disease sign off, 7/3, will f/u with patient 7/26 -Continue Linezolid PO  day 11/28 -Continue IV daptomycin day11/42,consult pharmacy for dosing with HD -weekly labs: CBC w/ diff, BMP, CK -Continue to follow-up on repeat blood cultures from after his line was pulled - patient most recent Hgb 7.3 from this morning, willnot transfuse at this time. Patient previously reported bleeding after the  procedure prior to IR suturing the area at the bedside.  Infected dialysis catheter Following a line holiday, and new leftnontunneledIJ catheter was placed on 7/2.New IJ tunneled catheter placed7/7, patient had bleeding after the surgery. IR sutured with dissolvable sutures at the bedside, no f/u needed.Patient Hgb 7.3 today. WBC 10.1 today. -Continue to monitor catheter site -Back on TTS schedule for dialysis -Tramadol twice daily as needed for pain -Flexeril 5mg  q 8hrs PRN for muscle pain - Continue to monitor CBCs  Right shoulder pain Patient complaining of right shoulder pain that he rates a 10/10 and describes it as a dull, achy pain. IT may be due to his osteoarthritis which was seen on x-ray of right shoulder. Pain is elicited on both active and passive ROM. He received oxycodone last night which he states provided adequate pain relief that allowed me to sleep at night. Voltaren gel was previously used, patient stated it helped until now when the pain has suddenly worsened.  -Oxycodone given  -Flexeril and tylenol given by nurse  -Increase gabapentin to 300 mg  -Continue PT -Continue to monitor -May consider imaging if worsening symptoms  -Include PT recommendation of outpatient physical therapy 3 times a week in discharge summary   Right chest/neck pain Stable.  Patient noticed some right chest/neck pain since his admission. Patientpain appears to be managed well with voltaren gel.His chest pain has not bothered him for a few days and denies chest pain today.We will continue to monitor as X ray has a low sensitivity for osteomyelitis and therefore it cannot be completly ruled out. CT could also be used to detect septic joint. PT recommends outpatient PT, 3x/ week. -Continue to monitor - Flexeril 5mg  q 8hrs PRN for likely muscle pain - May consider CT of shoulder if pain continues  - Voltaren gel 4x daily scheduled - Continue PT for shoulder  -  Include PT  recommendation of outpatient physical therapy 3 times a week in discharge summary  Right forearm swelling Stable. No swelling noted, likely resolved.Possible thrombus in the right upper extremityhas been ruled out.  -VENOUSVAS US of the upper extremities found no evidence of DVT in the UE, no evidence of superficial vein thrombosis, no evidence of thrombosis in the subclavian. - Will continue to monitor -Continue Eliquis   Left bicep pain Patient has previously reported that he began having pain in hismedial,left bicep after procedure but denies experiencing any pain currently. The pain at the time was constant, but is worse with movement. The pain follows a cord-like pattern. The patient also reports that he feels more short of breath after his surgery.  -Performed venous US of L upper extremity which demonstrated no evidence of DVT or superficial vein thrombosis in the left upper extremity.  Type 2 diabetes Nurse notified me that blood glucose earlier this morning was 55 and later increased to 100. Most recent Hgb A1c 11.5 on 06/09/2020.   -DecreasedLantus -Continue to monitor blood glucose levels and adjust as needed  -SSI moderate -At bedtime coverage - Continue gabapentin for diabetic neuropathy -Ordered prosthetics for bilateral lower extremity per PT recs -Consider encouraging patient to follow-up with PCP regarding proper diabetes management within the outpatient setting  Hypertension Patient's BP is 129/85.  He denies chest pain but admits to occasional dyspnea. Systolic blood pressures have previously ranged between 115 and 141. -Continue amlodipine 10 mg and hydralazine 100 mg tid daily -Continue to monitor vitals including BP   Atrial flutter s/p ablation Patient's home medication include amiodarone and apixaban -Continue amiodarone -Continue Eliquis -Continuous cardiac monitoring  Hyperlipidemia -Atorvastatin 40 mg -Consider discussion with patient on  importance of relative measures by incorporating appropriate lifestyle modifications including healthy diet and adequate physical activity to work in conjunction with current pharmacologic therapy.  Bipolar disorder Patient currently denies any symptoms including racing thoughts, mood is appropriate. -Abilify 5 mg daily  CHF -Continue to monitor fluid status -Dialysis TTS  GERD -Patient denies any reflux and epigastric pain at this time. -Continue Protonix  Polysubstance abuse We will ensure that he is accompaniedwhenhe is allotted time outside. Nurse found patient ingesting "chalk." Bag was still in the room, most of the bag was in [place], but it was determined that the chalk was actually clay, which the patient confirmed. Patient reportedthat this is a cultural remedy for ingesting Vitamins. Patient was under the impression that the chalk was from [facility] where his family is from and had previously participated in the practice of ingesting chalk for nutrients. The bag has been removed from the patient's room and placed with his possessions.  - Continue to monitor patient  Hx ofHomelessness Per chart review patient has history of homelessness. Patient reports that heisnot homeless at this time and that he is moving back to [facility] soon. Patient does acknowledge that he missed his dialysis appointment because his ride to the dialysis appointment fell through. -Social work consulted for confirmation that patient has stable living situation  Buttocks wound Patient denies any complaints at this time.   -We will continue to monitor for any changes in symptoms   FEN/GI: Renal carb modified PPx: Eliquis  Disposition: Inpatient  Subjective:  Patient seen and examined at bedside. He admits to occasional dyspnea and increased right shoulder pain that he rates a 10/10. He states that the voltaren gel has not been helping. According to the nurse, percocet was given yesterday  morning and oxycodone was given last night. Patient stated that his pain was significantly better after oxycodone was given which allowed him to sleep comfortably.   Objective: Temp:  [98.2 F (36.8 C)-98.3 F (36.8 C)] 98.2 F (36.8 C) (07/12 0457) Pulse Rate:  [80-98] 80 (07/12 0457) Resp:  [18-20] 18 (07/12 0457) BP: (121-133)/(80-95) 129/85 (07/12 0457) SpO2:  [100 %] 100 % (07/12 0457) Physical Exam: General: Patient in no acute distress, sitting comfortably on the edge of the bed. Cardiovascular: regular rate and rhythm, no murmurs or gallops noted Respiratory: lungs clear to auscultation bilaterally  Abdomen: firm, nontender on palpation, distended  Extremities: increased right shoulder pain with active and passive ROM posteriorly, nontender on palpation, bilaterally below knee amputation   Laboratory: Recent Labs  Lab 06/20/20 0714 06/20/20 0714 06/22/20 0756 06/22/20 1634 06/24/20 0434  WBC 8.9  --  11.3*  --  10.1  HGB 7.0*   < > 7.0* 7.5* 7.3*  HCT 23.0*   < > 23.1* 24.1* 23.5*  PLT 246  --  240  --  207   < > = values in this interval not displayed.   Recent Labs  Lab 06/20/20 0714 06/22/20 1257 06/24/20 0434  NA 132* 131* 132*  K 4.8 5.0 4.9  CL 97* 94* 95*  CO2 22 23 21*  BUN 82* 66* 65*  CREATININE 9.59* 8.89* 8.06*  CALCIUM 8.9 9.0 8.9  GLUCOSE 174* 180* 93      Imaging/Diagnostic Tests: No results found.   Donney Dice, DO 06/24/2020, 6:08 AM PGY-1, [place] Intern pager: [phone], text pages welcome

## 2020-06-24 NOTE — Progress Notes (Addendum)
Pharmacy Antibiotic Note  Lucas [name] is a 49 y.o. male admitted on 06/09/2020 with enterococcal bacteremia. Now found to have a mitral valve vegetation. Pharmacy has been consulted for Daptomycin dosing. Also on Zyvox for Gent-R Enterococcal MV IE.    The patient is ESRD - s/ptunneled HD catheter per IR7/6/21.  Pt is now on routine TTS dialysis schedule.  Will adjust daptomycin dose accordingly.  Adjusted body weight ~110 kg.   Patient is getting extra HD session today for volume overload. Will not give extra dose given that patient received 10mg /kg dose on Saturday and will resume regular HD schedule tomorrow. CK trending upward slowly, continue to monitor.   Plan: - Daptomycin 800 mg IV following HD Tues/Thur and 1000 mg following HD on Sat until 8/9  - Continue Zyvox 600 mg po every 12 hours until 7/28 - Will continue to monitor HD tolerance and schedule - Q Monday CK     Height: 6\' 4"  (193 cm) Weight:  (unable to obtain) IBW/kg (Calculated) : 86.8  Temp (24hrs), Avg:98.1 F (36.7 C), Min:97.7 F (36.5 C), Max:98.3 F (36.8 C)  Recent Labs  Lab 06/18/20 0643 06/20/20 0714 06/22/20 0756 06/22/20 1257 06/24/20 0434  WBC 7.5 8.9 11.3*  --  10.1  CREATININE 7.69* 9.59*  --  8.89* 8.06*    Estimated Creatinine Clearance: 17.8 mL/min (A) (by C-G formula based on SCr of 8.06 mg/dL (H)).    No Known Allergies   Linezolid 7/1>> (7/29) Dapto 7/1> (8/9)  Vanc 6/28>>7/1 Gent 6/29>> 6/30  7/2 CK 66 7/5 CK 78 7/12 CK 169    6/27 MRSA PCR + 6/27 BCx: Enterococcus faecalis (BCID Enterococcus) (S-amp, vanc) Gent synergy resistant 6/28 BCx: Enterococcus faecalis 6/30 BCx: ngtd   Thank you for allowing pharmacy to be a part of this patient's care.  Benetta Spar, PharmD, BCPS, BCCP Clinical Pharmacist  Please check AMION for all [place] phone numbers After 10:00 PM, call [place] [phone]

## 2020-06-25 ENCOUNTER — Inpatient Hospital Stay (HOSPITAL_COMMUNITY): Payer: Medicaid Other

## 2020-06-25 LAB — GLUCOSE, CAPILLARY
Glucose-Capillary: 147 mg/dL — ABNORMAL HIGH (ref 70–99)
Glucose-Capillary: 105 mg/dL — ABNORMAL HIGH (ref 70–99)
Glucose-Capillary: 152 mg/dL — ABNORMAL HIGH (ref 70–99)
Glucose-Capillary: 170 mg/dL — ABNORMAL HIGH (ref 70–99)

## 2020-06-25 MED ORDER — DOXERCALCIFEROL 4 MCG/2ML IV SOLN
INTRAVENOUS | Status: AC
  Administered 2020-06-25: 4 ug
  Filled 2020-06-25: qty 2

## 2020-06-25 MED ORDER — TRAMADOL HCL 50 MG PO TABS
50.0000 mg | ORAL_TABLET | Freq: Once | ORAL | Status: AC
  Administered 2020-06-25: 50 mg via ORAL
  Filled 2020-06-25: qty 1

## 2020-06-25 MED ORDER — OXYCODONE HCL 5 MG PO TABS
5.0000 mg | ORAL_TABLET | Freq: Once | ORAL | Status: AC
  Administered 2020-06-25: 5 mg via ORAL
  Filled 2020-06-25: qty 1

## 2020-06-25 MED ORDER — HEPARIN SODIUM (PORCINE) 1000 UNIT/ML IJ SOLN
INTRAMUSCULAR | Status: AC
  Administered 2020-06-25: 3800 [IU]
  Filled 2020-06-25: qty 4

## 2020-06-25 NOTE — Progress Notes (Signed)
Family Medicine Teaching Service Daily Progress Note Intern Pager: [phone]  Patient name: Lucas [name] Medical record number: [id number] Date of birth: 07-Feb-1971 Age: 49 y.o. Gender: male  Primary Care Provider: System, Pcp Not In Consultants: Nephrology, cardiology, infectious disease Code Status: Full code  Pt Overview and Major Events to Date:  Admitted for hyperglycemia and port pain on 06/09/2020 Enterococcus grew on blood cultures on 06/10/2020 TTE demonstrated mitral valve vegetation on 06/11/2020 Removal of right IJ HD catheter on 06/11/2020 TEE completed on 06/13/2020 Patient received left IJ nontunneled, restarted HD on 06/14/2020 Patient received long-term tunneled left IJ on 06/19/2020  Antibiotics Patient's current antibiotics and duration listed below. Gentamicin 6/29-6/30 Vancomycin 6/28-6/29 Linezolid 7/1-7/28 Daptomycin 7/1-8/9  Assessment and Plan: Lucas [name] a 49 y.o.malewith Enterococcus endocarditis. PMH is significant foratrial flutter s/p ablation, T2DM, ESRD, hyperlipidemia, anemia, CHF, GERD, polysubstance abuseandhomelessness.  Enterococcus endocarditis Patienthas been compliant with his treatmentthusfar. He recognizes that he may be in the hospital for an extended period.CVTS noted that he is not a good surgical candidate due to his significant comorbidities and current social situation. They advised following up in clinic following completion of his antibiotic course. -Nephrologyfollowing,patient stable for discharge from renal standpoint -Infectious disease sign off, 7/3, will f/u with patient 7/26 -ContinueLinezolidPOday 12/28 -Continue IV daptomycin day12/42,consult pharmacy for dosing with HD -weekly labs: CBC w/ diff, BMP, CK -Continue to follow-up on repeat blood cultures from after his line was pulled - patientmost recentHgb7.3 from yesterday, willnot transfuse at this time. Patient previously reported bleeding after the  procedure prior to IR suturing the area at the bedside.  Infected dialysis catheter Following a line holiday, and new leftnontunneledIJ catheter was placed on 7/2.New IJ tunneled catheter placed7/7, patient had bleeding after the surgery. IR sutured with dissolvable sutures at the bedside, no f/u needed.Patient Hgb7.3 and WBC 10.1 yesterday. -Continue to monitor catheter site -Back on TTS schedule for dialysis -Tramadol twice daily as needed for pain -Flexeril 5mg  q 8hrs PRN for muscle pain -Continue to monitor CBCs  Right shoulder pain Patient complaining of right shoulder pain that he rated a 10/10 yesterday and today 8/10. He describes it as a dull, achy pain. It may be due to his osteoarthritis which was seen on x-ray of right shoulder. Pain is elicited on both active and passive ROM. He received oxycodone last night which he states provided adequate pain relief that allowed me to sleep at night. Voltaren gel was previously used, patient stated it helped until now when the pain has suddenly worsened. Multiple doses of oxycodone were given yesterday. Today patient is able to have more ROM than previously. Patient did not have any overnight events relating to his recurrent shoulder pain, the increase in gabapentin may have helped with partial pain relief.  -Continue gabapentin 300 mg  -Voltaren gel, tylenol, flexeril as needed for pain  -Continue PT -Continue to monitor -May consider imaging if worsening symptoms  -Include PT recommendation of outpatient physical therapy 3 times a week in discharge summary   Right chest/neck pain Stable. Patient noticedsome right chest/neck pain since his admission. Patientpain appears to be managed well with voltaren gel.His chest pain has not bothered him for a few daysand denies chest pain today.We will continue to monitor as X ray has a low sensitivity for osteomyelitis and therefore it cannot be completly ruled out. CT could also be  used to detect septic joint. PT recommends outpatient PT, 3x/ week. -Continue to monitor - Flexeril 5mg  q 8hrs PRN for  likely muscle pain - May consider CT of shoulder if pain continues  - Voltaren gel 4x daily scheduled - Continue PT for shoulder -Include PT recommendation of outpatient physical therapy 3 times a week in discharge summary  Right forearm swelling Stable.No swelling noted, likely resolved.Possible thrombus in the right upper extremityhas been ruled out.  -VENOUSVAS US of the upper extremities found no evidence of DVT in the UE, no evidence of superficial vein thrombosis, no evidence of thrombosis in the subclavian. - Will continue to monitor -Continue Eliquis   Leftbicep pain Patienthas previouslyreportedthat he began having pain in hismedial,left bicep after procedurebut denies experiencing any pain currently. The pain at the time was constant, but is worse with movement. The pain follows a cord-like pattern. The patient also reports that he feels more short of breath after his surgery.  -Performed venous US of L upper extremitywhich demonstrated no evidence of DVT or superficial vein thrombosis in the left upper extremity.  Type 2 diabetes Nurse notified me that blood glucose earlier this morning was 55 and later increased to 100. Most recentHgb A1c11.5 on 06/09/2020.CBG today 147>105.   -ContinueLantus -Continue to monitor blood glucose levels and adjust as needed  -SSI moderate -At bedtime coverage -Continue gabapentin for diabetic neuropathy -Ordered prosthetics for bilateral lower extremity per PT recs -Consider encouraging patient to follow-up with PCP regarding proper diabetes management within the outpatient setting  Hypertension Patient'sBP today 156/96>123/87, with BP fluctuating and hypertensive at times and normotensive other times.He denies chest pain and dyspnea. Systolic blood pressures havepreviouslyranged between115  and141. -Continue amlodipine 10mg and hydralazine 100 mgtid daily -Continue to monitor vitals including BP  Atrial flutter s/p ablation Patient's home medication include amiodarone and apixaban -Continueamiodarone -ContinueEliquis -Continuous cardiac monitoring  Hyperlipidemia -Atorvastatin 40 mg -Consider discussion with patient on importance of relative measures by incorporating appropriate lifestyle modifications including healthy diet and adequate physical activity to work in conjunction with current pharmacologic therapy.  Bipolar disorder Patient currently denies any symptoms including racing thoughts, mood is appropriate. -Abilify 5 mg daily  CHF -Continue to monitor fluid status -Dialysis TTS  GERD -Patient denies any reflux and epigastric pain at this time. -ContinueProtonix  Polysubstance abuse We will ensure that he is accompaniedwhenhe is allotted time outside. Nurse found patient ingesting "chalk." Bag was still in the room, most of the bag was in [place], but it was determined that the chalk was actually clay, which the patient confirmed. Patient reportedthat this is a cultural remedy for ingesting Vitamins. Patient was under the impression that the chalk was from [facility] where his family is from and had previously participated in the practice of ingesting chalk for nutrients. The bag has been removed from the patient's room and placed with his possessions.  - Continue to monitor patient  Hx ofHomelessness Per chart review patient has history of homelessness. Patient reports that heisnot homeless at this time and that he is moving back to [facility] soon. Patient does acknowledge that he missed his dialysis appointment because his ride to the dialysis appointment fell through. -Social work consulted for confirmation that patient has stable living situation  Buttocks wound Patient denies any complaints at this time. Examined today and looks like it  is healing very well. Patient denies any associated pain.  -We will continue to monitor for any changes insymptoms  FEN/GI: Renalcarb modified PPx: Eliquis  Disposition: Inpatient  Subjective:  Patient was seen and examined in hemodialysis. He denies chest pain, dyspnea, nausea, vomiting and dizziness. He admits  to right shoulder that has improved to an 8/10 today. It appears that the patient has no limitation with both active and passive range of motion but elicits tenderness along the shoulder (that was not present yesterday). We had an extensive discussion regarding the lack of long-term benefits of oxycodone and I expressed to the patient that I want to put him on a regimen that he can continue to use at home long-term to better control his pain. He tells me that he would like to receive the oxycodone 2 more times and then would like to be weaned off since he will be in the hospital for 3 more weeks. I conveyed to the patient that I did not feel comfortable giving him oxycodone and if the pain worsened then we will reassess.  Buttocks wound  Objective: Temp:  [97.7 F (36.5 C)-97.8 F (36.6 C)] 97.8 F (36.6 C) (07/13 0504) Pulse Rate:  [89-99] 89 (07/13 0504) Resp:  [16-18] 16 (07/13 0504) BP: (119-151)/(78-90) 119/79 (07/13 0504) SpO2:  [98 %-100 %] 100 % (07/13 0504) Physical Exam: General: Patient in no acute distress. Cardiovascular: regular rate and rhythm, no murmurs or gallops noted  Respiratory: lungs clear to auscultation bilaterally, no increased work of breathing noted  Abdomen: firm, distended, nontender on palpation Derm: warm and dry to touch Psych: mood appropriate, no agitation noted   Laboratory: Recent Labs  Lab 06/20/20 0714 06/20/20 0714 06/22/20 0756 06/22/20 1634 06/24/20 0434  WBC 8.9  --  11.3*  --  10.1  HGB 7.0*   < > 7.0* 7.5* 7.3*  HCT 23.0*   < > 23.1* 24.1* 23.5*  PLT 246  --  240  --  207   < > = values in this interval not displayed.    Recent Labs  Lab 06/20/20 0714 06/22/20 1257 06/24/20 0434  NA 132* 131* 132*  K 4.8 5.0 4.9  CL 97* 94* 95*  CO2 22 23 21*  BUN 82* 66* 65*  CREATININE 9.59* 8.89* 8.06*  CALCIUM 8.9 9.0 8.9  GLUCOSE 174* 180* 93      Imaging/Diagnostic Tests: No results found.  Donney Dice, DO 06/25/2020, 6:46 AM PGY-1, [place] Intern pager: [phone], text pages welcome

## 2020-06-25 NOTE — Progress Notes (Signed)
Patient ID: Lucas [name], male   DOB: 11-Nov-1971, 49 y.o.   MRN: [id number] S: Sleepy, no complaints.  No extra tx yesterday due to schedule overload.  O:BP (!) 129/56   Pulse 90   Temp 97.8 F (36.6 C) (Oral)   Resp 16   Ht 6\' 4"  (1.93 m)   Wt (!) 149.7 kg   SpO2 100%   BMI 40.17 kg/m   Intake/Output Summary (Last 24 hours) at 06/25/2020 1039 Last data filed at 06/25/2020 0900 Gross per 24 hour  Intake 620 ml  Output 0 ml  Net 620 ml   Intake/Output: I/O last 3 completed shifts: In: 620 [P.O.:620] Out: 0   Intake/Output this shift:  No intake/output data recorded. Weight change:  Gen: NAD, periorbital edema noted CVS: no rub Resp: cta Abd: distended, + edema Ext: s/p bilateral BKAs with 1+ edema   Recent Labs  Lab 06/20/20 0714 06/22/20 1257 06/24/20 0434  NA 132* 131* 132*  K 4.8 5.0 4.9  CL 97* 94* 95*  CO2 22 23 21*  GLUCOSE 174* 180* 93  BUN 82* 66* 65*  CREATININE 9.59* 8.89* 8.06*  ALBUMIN 2.4* 2.5*  --   CALCIUM 8.9 9.0 8.9  PHOS 4.8* 5.3*  --    Liver Function Tests: Recent Labs  Lab 06/20/20 0714 06/22/20 1257  ALBUMIN 2.4* 2.5*   No results for input(s): LIPASE, AMYLASE in the last 168 hours. No results for input(s): AMMONIA in the last 168 hours. CBC: Recent Labs  Lab 06/20/20 0714 06/20/20 0714 06/22/20 0756 06/22/20 1634 06/24/20 0434  WBC 8.9  --  11.3*  --  10.1  NEUTROABS  --   --  9.6*  --  8.2*  HGB 7.0*   < > 7.0* 7.5* 7.3*  HCT 23.0*   < > 23.1* 24.1* 23.5*  MCV 94.3  --  93.9  --  93.6  PLT 246  --  240  --  207   < > = values in this interval not displayed.   Cardiac Enzymes: Recent Labs  Lab 06/24/20 0434  CKTOTAL 169   CBG: Recent Labs  Lab 06/24/20 0714 06/24/20 1120 06/24/20 1644 06/24/20 2031 06/25/20 0639  GLUCAP 100* 116* 157* 152* 147*    Iron Studies:  No results for input(s): IRON, TIBC, TRANSFERRIN, FERRITIN in the last 72 hours. Studies/Results: No results found. . sodium chloride    Intravenous Once  . acetaminophen  1,000 mg Oral Q6H WA  . amiodarone  200 mg Oral Daily  . amLODipine  10 mg Oral Daily  . apixaban  5 mg Oral BID  . ARIPiprazole  5 mg Oral Daily  . atorvastatin  40 mg Oral Daily  . carvedilol  12.5 mg Oral BID  . Chlorhexidine Gluconate Cloth  6 each Topical Q0600  . Chlorhexidine Gluconate Cloth  6 each Topical Q0600  . darbepoetin (ARANESP) injection - DIALYSIS  150 mcg Intravenous Q Sat-HD  . diclofenac Sodium  4 g Topical QID  . doxercalciferol  4 mcg Oral Q T,Th,Sa-HD  . gabapentin  300 mg Oral QHS  . hydrALAZINE  100 mg Oral TID  . insulin aspart  0-15 Units Subcutaneous TID WC  . insulin aspart  0-5 Units Subcutaneous QHS  . insulin glargine  20 Units Subcutaneous Daily  . lidocaine  1 patch Transdermal Q24H  . linezolid  600 mg Oral Q12H  . melatonin  5 mg Oral QHS  . pantoprazole  40 mg Oral Daily  .  polyethylene glycol  17 g Oral Daily  . QUEtiapine  100 mg Oral QHS  . sevelamer carbonate  1,600 mg Oral TID with meals  . sodium chloride flush  10-40 mL Intracatheter Q12H  . tamsulosin  0.4 mg Oral Daily    BMET    Component Value Date/Time   NA 132 (L) 06/24/2020 0434   K 4.9 06/24/2020 0434   CL 95 (L) 06/24/2020 0434   CO2 21 (L) 06/24/2020 0434   GLUCOSE 93 06/24/2020 0434   BUN 65 (H) 06/24/2020 0434   CREATININE 8.06 (H) 06/24/2020 0434   CALCIUM 8.9 06/24/2020 0434   GFRNONAA 7 (L) 06/24/2020 0434   GFRAA 8 (L) 06/24/2020 0434   CBC    Component Value Date/Time   WBC 10.1 06/24/2020 0434   RBC 2.51 (L) 06/24/2020 0434   HGB 7.3 (L) 06/24/2020 0434   HCT 23.5 (L) 06/24/2020 0434   PLT 207 06/24/2020 0434   MCV 93.6 06/24/2020 0434   MCH 29.1 06/24/2020 0434   MCHC 31.1 06/24/2020 0434   RDW 17.4 (H) 06/24/2020 0434   LYMPHSABS 0.6 (L) 06/24/2020 0434   MONOABS 1.0 06/24/2020 0434   EOSABS 0.1 06/24/2020 0434   BASOSABS 0.0 06/24/2020 0434   [facility] [place] [facility]) Phone [phone] Scheduled TTS 4 hours His outpatient unit reports noncompliance BF 400; DF 800 F250 dialyzer  2/2.5 ca bath EDW 250 lbs Last weight 272.2 lbs on 6/24 (signed off early and had skipped the treatment prior) aranesp 120 mcg weekly  hectoral 4 mcg each tx  Assessment/Plan:  1. Enterococcus bacteremia/Mitral valve Endocarditis- due to tunneled HD catheter. TDC removed 6/29 s/p temp HD catheter placed 06/14/20.Now with new LIJ [place] placed 7/6. Will continue withIVcubicin andoralzyvoxper ID 1. Not candidate for surgery due to multiple co-morbidities, social issues, and polysubstance abuse 2. Noted vegetation on TTE 6/29 3. daptomycin through 07/22/20 and oral linezolid for 4 weeks (his home HD unit does have daptomycin) 4. Follow up with ID on 07/08/20 2. ESRDcontinue with TTS schedule and plan for HD after catheter placed by IR on 06/18/20.  1. TTS schedule here - HD today  3. Mitral valve endocarditis- noted on TTE 6/29. ID following. Daptomycin for 6 weeks through 07/22/20, oral linezolid for 4 weeks, follow up with ID on 07/08/20. 4. Anemia:due to ESRD. Continue with ESA 5. CKD-MBD:continue with home meds 6. Nutrition:renal diet 7. Hypertension:stable 8. Vascular access-s/ptunneled HD catheter per IR7/6/21. 9. Disposition- SW to assist with transportation to his outpatient unit.Stable for discharge from renal standpoint, however there appears to be issues with disposition.   Jannifer Hick MD [facility] Pager [phone]

## 2020-06-25 NOTE — Progress Notes (Addendum)
OT Cancellation Note  Patient Details Name: Arend Bahl MRN: [id number] DOB: 03/18/1971   Cancelled Treatment:    Reason Eval/Treat Not Completed: Patient at procedure or test/ unavailable (at HD. Will re-attempt session as time allows)  Re-attempted OT session at 1500 with pt politely declining due to fatigue from dialysis and having swollen scrotum. Discussed plan for OT session with pt agreeable to attempt tomorrow morning.   Layla Maw 06/25/2020, 9:23 AM

## 2020-06-25 NOTE — Progress Notes (Signed)
FPTS Interim Progress Note  S: Paged regarding patient endorsing scrotal swelling and pain. He states that it started 3-4 days ago and then when asked again, he states that the pain has been ongoing for a long time. When I saw the patient this morning in dialysis, he did not convey this pain to me and denied any pain except pain in his right shoulder. Patient rates his pain as a 10/10 and describes it as excruciating pain where he states that he needs something "strong than tramadol" for. Patient is unable to characterize the pain as either sharp or dull, achy pain. He states that it only hurts when he moves positions. When asked, patient states that the swelling has increased since yesterday. Nurse states that he was also unaware of this new scrotal pain until now. Per nurse, the patient wanted to go outside despite his scrotal pain so the nurse put him in his chair and used a towel to elevate the scrotum. Patient states that the elevation failed to provide any pain relief. He states that he wants IV oxycodone for his pain because "oral takes too long to work."   O: BP 123/87 (BP Location: Left Arm)   Pulse 91   Temp 98.5 F (36.9 C)   Resp 18   Ht 6\' 4"  (1.93 m)   Wt (!) 149.7 kg   SpO2 98%   BMI 40.17 kg/m   General: patient laying in bed, he does not appear in any acute distress GU: scrotal swelling noted with tenderness along the inner thigh, some clear discharge from scrotum noted on the towel     A/P: Scrotal swelling and pain -Scrotal ultrasound ordered  -Tramadol 50 mg for pain control  -Continue to monitor  Donney Dice, DO 06/25/2020, 3:40 PM PGY-1, [facility] pager [phone]

## 2020-06-25 NOTE — Procedures (Signed)
I was present at this dialysis session, have reviewed the session itself and made  appropriate changes 130/70 UF 4.5L Qb 400 TDC Jannifer Hick MD [facility] pager [phone]   06/25/2020, 10:40 AM

## 2020-06-26 DIAGNOSIS — N5089 Other specified disorders of the male genital organs: Secondary | ICD-10-CM

## 2020-06-26 LAB — BASIC METABOLIC PANEL
Anion gap: 15 (ref 5–15)
BUN: 55 mg/dL — ABNORMAL HIGH (ref 6–20)
CO2: 25 mmol/L (ref 22–32)
Calcium: 8.9 mg/dL (ref 8.9–10.3)
Chloride: 93 mmol/L — ABNORMAL LOW (ref 98–111)
Creatinine, Ser: 7.04 mg/dL — ABNORMAL HIGH (ref 0.61–1.24)
GFR calc Af Amer: 10 mL/min — ABNORMAL LOW (ref >60)
GFR calc non Af Amer: 8 mL/min — ABNORMAL LOW (ref >60)
Glucose, Bld: 153 mg/dL — ABNORMAL HIGH (ref 70–99)
Potassium: 4.4 mmol/L (ref 3.5–5.1)
Sodium: 133 mmol/L — ABNORMAL LOW (ref 135–145)

## 2020-06-26 LAB — GLUCOSE, CAPILLARY
Glucose-Capillary: 152 mg/dL — ABNORMAL HIGH (ref 70–99)
Glucose-Capillary: 178 mg/dL — ABNORMAL HIGH (ref 70–99)
Glucose-Capillary: 159 mg/dL — ABNORMAL HIGH (ref 70–99)
Glucose-Capillary: 206 mg/dL — ABNORMAL HIGH (ref 70–99)

## 2020-06-26 LAB — CBC
HCT: 24.4 % — ABNORMAL LOW (ref 39.0–52.0)
Hemoglobin: 7.5 g/dL — ABNORMAL LOW (ref 13.0–17.0)
MCH: 28.8 pg (ref 26.0–34.0)
MCHC: 30.7 g/dL (ref 30.0–36.0)
MCV: 93.8 fL (ref 80.0–100.0)
Platelets: 228 10*3/uL (ref 150–400)
RBC: 2.6 MIL/uL — ABNORMAL LOW (ref 4.22–5.81)
RDW: 17.7 % — ABNORMAL HIGH (ref 11.5–15.5)
WBC: 10.6 10*3/uL — ABNORMAL HIGH (ref 4.0–10.5)
nRBC: 0.4 % — ABNORMAL HIGH (ref 0.0–0.2)

## 2020-06-26 MED ORDER — HYDROCODONE-ACETAMINOPHEN 5-325 MG PO TABS
1.0000 | ORAL_TABLET | Freq: Once | ORAL | Status: AC
  Administered 2020-06-26: 1 via ORAL
  Filled 2020-06-26: qty 1

## 2020-06-26 MED ORDER — OXYCODONE HCL 5 MG PO TABS
5.0000 mg | ORAL_TABLET | Freq: Once | ORAL | Status: AC
  Administered 2020-06-26: 5 mg via ORAL
  Filled 2020-06-26: qty 1

## 2020-06-26 MED ORDER — AMLODIPINE BESYLATE 5 MG PO TABS
5.0000 mg | ORAL_TABLET | Freq: Every day | ORAL | Status: DC
  Administered 2020-06-27 – 2020-07-07 (×10): 5 mg via ORAL
  Filled 2020-06-26 (×12): qty 1

## 2020-06-26 NOTE — Progress Notes (Signed)
Patient ID: Lucas [name], male   DOB: 10-26-1971, 49 y.o.   MRN: [id number] S: S/p HD yest with UF 4L. Reported scrotal pain and swelling yest - [place] unrevealing. Requesting LVP today.   O:BP 103/83 (BP Location: Left Arm)   Pulse 91   Temp 98.9 F (37.2 C) (Oral)   Resp 18   Ht 6\' 4"  (1.93 m)   Wt (!) 149.7 kg   SpO2 98%   BMI 40.17 kg/m   Intake/Output Summary (Last 24 hours) at 06/26/2020 1049 Last data filed at 06/26/2020 0900 Gross per 24 hour  Intake 1036 ml  Output 4000 ml  Net -2964 ml   Intake/Output: I/O last 3 completed shifts: In: 9798 [P.O.:1020; IV Piggyback:116] Out: 4000 [Other:4000]  Intake/Output this shift:  Total I/O In: 120 [P.O.:120] Out: -  Weight change:  Gen: NAD, periorbital edema noted CVS: no rub Resp: cta Abd: distended, + wall edema Ext: s/p bilateral BKAs with 1+ edema   Recent Labs  Lab 06/20/20 0714 06/22/20 1257 06/24/20 0434  NA 132* 131* 132*  K 4.8 5.0 4.9  CL 97* 94* 95*  CO2 22 23 21*  GLUCOSE 174* 180* 93  BUN 82* 66* 65*  CREATININE 9.59* 8.89* 8.06*  ALBUMIN 2.4* 2.5*  --   CALCIUM 8.9 9.0 8.9  PHOS 4.8* 5.3*  --    Liver Function Tests: Recent Labs  Lab 06/20/20 0714 06/22/20 1257  ALBUMIN 2.4* 2.5*   No results for input(s): LIPASE, AMYLASE in the last 168 hours. No results for input(s): AMMONIA in the last 168 hours. CBC: Recent Labs  Lab 06/20/20 0714 06/20/20 0714 06/22/20 0756 06/22/20 1634 06/24/20 0434  WBC 8.9  --  11.3*  --  10.1  NEUTROABS  --   --  9.6*  --  8.2*  HGB 7.0*   < > 7.0* 7.5* 7.3*  HCT 23.0*   < > 23.1* 24.1* 23.5*  MCV 94.3  --  93.9  --  93.6  PLT 246  --  240  --  207   < > = values in this interval not displayed.   Cardiac Enzymes: Recent Labs  Lab 06/24/20 0434  CKTOTAL 169   CBG: Recent Labs  Lab 06/25/20 0639 06/25/20 1254 06/25/20 1633 06/25/20 2142 06/26/20 0638  GLUCAP 147* 105* 152* 170* 152*    Iron Studies:  No results for input(s): IRON, TIBC,  TRANSFERRIN, FERRITIN in the last 72 hours. Studies/Results: US SCROTUM W/DOPPLER  Result Date: 06/25/2020 CLINICAL DATA:  Scrotal swelling EXAM: SCROTAL ULTRASOUND DOPPLER ULTRASOUND OF THE TESTICLES TECHNIQUE: Complete ultrasound examination of the testicles, epididymis, and other scrotal structures was performed. Color and spectral Doppler ultrasound were also utilized to evaluate blood flow to the testicles. COMPARISON:  None. FINDINGS: Right testicle Measurements: 6.3 x 3.1 x 2.8 cm. The right testicle demonstrates a prominent cleft which may relate to remote trauma or represent a a congenital abnormality. No intra testicular mass or calcification, however, is seen. There is normal parenchymal echogenicity and echotexture. Left testicle Measurements: 3.9 x 2.5 x 3.0 cm. No mass or microlithiasis visualized. Right epididymis:  Normal in size and appearance. Left epididymis:  Normal in size and appearance. Hydrocele:  None visualized. Varicocele:  None visualized. Pulsed Doppler interrogation of both testes demonstrates normal low resistance arterial and venous waveforms bilaterally. Scrotum: There is diffuse scrotal wall edema noted. IMPRESSION: Diffuse scrotal wall edema. Prominent cleft within the right testicle which may relate to remote trauma. Otherwise normal examination  of the testes with normal bilateral testicular vascularity identified. Electronically Signed   By: Fidela Salisbury MD   On: 06/25/2020 21:48   . sodium chloride   Intravenous Once  . acetaminophen  1,000 mg Oral Q6H WA  . amiodarone  200 mg Oral Daily  . amLODipine  10 mg Oral Daily  . apixaban  5 mg Oral BID  . ARIPiprazole  5 mg Oral Daily  . atorvastatin  40 mg Oral Daily  . carvedilol  12.5 mg Oral BID  . Chlorhexidine Gluconate Cloth  6 each Topical Q0600  . Chlorhexidine Gluconate Cloth  6 each Topical Q0600  . darbepoetin (ARANESP) injection - DIALYSIS  150 mcg Intravenous Q Sat-HD  . diclofenac Sodium  4 g Topical  QID  . doxercalciferol  4 mcg Oral Q T,Th,Sa-HD  . gabapentin  300 mg Oral QHS  . hydrALAZINE  100 mg Oral TID  . insulin aspart  0-15 Units Subcutaneous TID WC  . insulin aspart  0-5 Units Subcutaneous QHS  . insulin glargine  20 Units Subcutaneous Daily  . lidocaine  1 patch Transdermal Q24H  . linezolid  600 mg Oral Q12H  . melatonin  5 mg Oral QHS  . pantoprazole  40 mg Oral Daily  . polyethylene glycol  17 g Oral Daily  . QUEtiapine  100 mg Oral QHS  . sevelamer carbonate  1,600 mg Oral TID with meals  . sodium chloride flush  10-40 mL Intracatheter Q12H  . tamsulosin  0.4 mg Oral Daily    BMET    Component Value Date/Time   NA 132 (L) 06/24/2020 0434   K 4.9 06/24/2020 0434   CL 95 (L) 06/24/2020 0434   CO2 21 (L) 06/24/2020 0434   GLUCOSE 93 06/24/2020 0434   BUN 65 (H) 06/24/2020 0434   CREATININE 8.06 (H) 06/24/2020 0434   CALCIUM 8.9 06/24/2020 0434   GFRNONAA 7 (L) 06/24/2020 0434   GFRAA 8 (L) 06/24/2020 0434   CBC    Component Value Date/Time   WBC 10.1 06/24/2020 0434   RBC 2.51 (L) 06/24/2020 0434   HGB 7.3 (L) 06/24/2020 0434   HCT 23.5 (L) 06/24/2020 0434   PLT 207 06/24/2020 0434   MCV 93.6 06/24/2020 0434   MCH 29.1 06/24/2020 0434   MCHC 31.1 06/24/2020 0434   RDW 17.4 (H) 06/24/2020 0434   LYMPHSABS 0.6 (L) 06/24/2020 0434   MONOABS 1.0 06/24/2020 0434   EOSABS 0.1 06/24/2020 0434   BASOSABS 0.0 06/24/2020 0434   [facility] [place] [facility]) Phone [phone] Scheduled TTS 4 hours His outpatient unit reports noncompliance BF 400; DF 800 F250 dialyzer  2/2.5 ca bath EDW 250 lbs Last weight 272.2 lbs on 6/24 (signed off early and had skipped the treatment prior) aranesp 120 mcg weekly  hectoral 4 mcg each tx  Assessment/Plan:  1. Enterococcus bacteremia/Mitral valve Endocarditis- due to tunneled HD catheter. TDC removed 6/29 s/p temp HD catheter placed 06/14/20.Now with new LIJ [place] placed 7/6. Will continue  withIVcubicin andoralzyvoxper ID 1. Not candidate for surgery due to multiple co-morbidities, social issues, and polysubstance abuse 2. Noted vegetation on TTE 6/29 3. daptomycin through 07/22/20 and oral linezolid for 4 weeks (his home HD unit does have daptomycin) 4. Follow up with ID on 07/08/20 2. ESRDcontinue with TTS schedule; catheter replaced by IR on 06/18/20. 1. TTS schedule here - HD tomorrow with max UF, may need extra tx.  Reiterated fluid restriction. 3. Mitral valve  endocarditis- noted on TTE 6/29. ID following. Daptomycin for 6 weeks through 07/22/20, oral linezolid for 4 weeks, follow up with ID on 07/08/20. 4. Anemia:due to ESRD. Continue with ESA. Check Hb 7/15 5. CKD-MBD:continue with home meds 6. Nutrition:renal diet 7. Hypertension:BP low this AM - dec amlodipine from 10 to 5. 8. Vascular access-s/ptunneled HD catheter per IR7/6/21. 9. Disposition- SW to assist with transportation to his outpatient unit.Stable for discharge from renal standpoint, however there appears to be issues with disposition.   Jannifer Hick MD [facility] Pager [phone]

## 2020-06-26 NOTE — Progress Notes (Signed)
Occupational Therapy Treatment Patient Details Name: Lucas [name] MRN: [id number] DOB: Nov 11, 1971 Today's Date: 06/26/2020    History of present illness Ziair Penson is a 49 y.o. male with Enterococcus endocarditis. PMH is significant for atrial flutter s/p ablation, T2DM, ESRD, hyperlipidemia, anemia, CHF, GERD, polysubstance abuse, homelessness.   OT comments  Pt seen for OT follow up with focus on scrotal sling fabrication to improve BADL engagement/mobility progression. Scrotum cleaned and dried prior to application. Sling fit and donned per pt size. Educated pt on wearing schedule and cleaning. Pt is independent in educating caregiver how to don. During process of fabrication, pt completed transfer with WB through [place]. He states he does this at home with knee pads (which were present and donned). Educated pt on safety, but he insists this is the most efficient option for him. Continued to note RLE wound open, RN notified for dressing change. OT replaced foam on sacrum. Will continue to follow for scrotal sling management and BADL progression. D/c recs remain appropriate.    Follow Up Recommendations  Outpatient OT    Equipment Recommendations  3 in 1 bedside commode (drop arm)    Recommendations for Other Services      Precautions / Restrictions Precautions Precautions: Fall Restrictions Weight Bearing Restrictions: No       Mobility Bed Mobility Overal bed mobility: Modified Independent                Transfers                 General transfer comment: pt completed bil WB on BKAs on EOB with BUE of chair and knee pads donned at min guard level. Pt states this is how he does it at home.    Balance                                           ADL either performed or assessed with clinical judgement   ADL Overall ADL's : Needs assistance/impaired                         Toilet Transfer: Min Psychiatric nurse Details (indicate  cue type and reason): to scoot/pivot with BLEs. Pt also WB through BKAs with knee pads at baseline Toileting- Clothing Manipulation and Hygiene: Moderate assistance Toileting - Clothing Manipulation Details (indicate cue type and reason): mod A to complete/ change sacral foam       General ADL Comments: pt able to crawl on knees with knee pads without assistance. Session focused on scrotal sling fabrication and wearing instructions     Vision Patient Visual Report: No change from baseline Additional Comments: in need of new reading glasses. OT to issue next session   Perception     Praxis      Cognition Arousal/Alertness: Awake/alert Behavior During Therapy: WFL for tasks assessed/performed Overall Cognitive Status: No family/caregiver present to determine baseline cognitive functioning                                 General Comments: unsure of baseline. Pt making odd requests of OT during session, often tangential. Noted poor awareness of safety throughout        Exercises     Shoulder Instructions       General Comments RLE wound remains  open with dressing coming off. RN notified to change. Small sacral wound with new foam placed by OT    Pertinent Vitals/ Pain       Pain Assessment: No/denies pain  Home Living                                          Prior Functioning/Environment              Frequency  Min 2X/week        Progress Toward Goals  OT Goals(current goals can now be found in the care plan section)  Progress towards OT goals: Progressing toward goals  Acute Rehab OT Goals Patient Stated Goal: to get prothestics for both legs OT Goal Formulation: With patient Time For Goal Achievement: 07/02/20 Potential to Achieve Goals: Good  Plan Discharge plan remains appropriate    Co-evaluation                 AM-PAC OT "6 Clicks" Daily Activity     Outcome Measure   Help from another person eating  meals?: None Help from another person taking care of personal grooming?: A Little Help from another person toileting, which includes using toliet, bedpan, or urinal?: A Lot Help from another person bathing (including washing, rinsing, drying)?: A Little Help from another person to put on and taking off regular upper body clothing?: A Little Help from another person to put on and taking off regular lower body clothing?: A Little 6 Click Score: 18    End of Session    OT Visit Diagnosis: Other abnormalities of gait and mobility (R26.89);Muscle weakness (generalized) (M62.81);Other symptoms and signs involving cognitive function;Other symptoms and signs involving the nervous system (R29.898)   Activity Tolerance Patient tolerated treatment well   Patient Left in bed;with call bell/phone within reach   Nurse Communication Mobility status        Time: 3244-0102 OT Time Calculation (min): 25 min  Charges: OT General Charges $OT Visit: 1 Visit OT Treatments $Self Care/Home Management : 23-37 mins  Zenovia Jarred, MSOT, OTR/L [facility] Office Number: [phone] Pager: [phone]  Zenovia Jarred 06/26/2020, 6:21 PM

## 2020-06-26 NOTE — Plan of Care (Signed)
  Problem: Safety: Goal: Ability to remain free from injury will improve Outcome: Progressing   Problem: Skin Integrity: Goal: Risk for impaired skin integrity will decrease Outcome: Progressing   

## 2020-06-26 NOTE — Progress Notes (Signed)
Patient was found kneeling by the bedside and when asked he stated that his scrotum was hurting and lying down makes it worse. This nurse offered to put dressing on the irritated site to decrease the discomfort but patient refused. Call bell within reach and patient advised to call for help if needed. Will continue to monitor.   Milagros Loll, RN.

## 2020-06-26 NOTE — Plan of Care (Signed)
  Problem: Pain Managment: Goal: General experience of comfort will improve Outcome: Progressing   

## 2020-06-26 NOTE — Progress Notes (Signed)
Found patient kneeling down the floor with pillows as his knees cushions.Nurse asked him if he fell down or slid down from the bed,patient said '' I was able to do it by myself. I am more comfortable on this,look men.my balls are getting better.''.Reminded patient that he needs to call the staffs for help especially on getting in and out in the bed.Charge nurse is aware that patient has been getting out and into bed by himself.

## 2020-06-26 NOTE — Progress Notes (Signed)
PT Cancellation Note  Patient Details Name: Lucas [name] MRN: [id number] DOB: 03-26-1971   Cancelled Treatment:    Reason Eval/Treat Not Completed: Patient declined, no reason specified. Pt with scrotal edema and pain today, declining PT at this time. PT will continue to f/u with pt acutely as available and appropriate.    [place] 06/26/2020, 11:01 AM

## 2020-06-26 NOTE — Progress Notes (Signed)
Family Medicine Teaching Service Daily Progress Note Intern Pager: [phone]  Patient name: Lucas [name] Medical record number: [id number] Date of birth: October 25, 1971 Age: 49 y.o. Gender: male  Primary Care Provider: System, Pcp Not In Consultants: Nephrology, cardiology, infectious disease Code Status: Full Code  Pt Overview and Major Events to Date:  Admitted for hyperglycemia and port pain on 06/09/2020 Enterococcus grew on blood cultures on 06/10/2020 TTE demonstrated mitral valve vegetation on 06/11/2020 Removal of right IJ HD catheter on 06/11/2020 TEE completed on 06/13/2020 Patient received left IJ nontunneled, restarted HD on 06/14/2020 Patient received long-term tunneled left IJ on 06/19/2020  Antibiotics Patient's current antibiotics and duration listed below. Gentamicin 6/29-6/30 Vancomycin 6/28-6/29 Linezolid 7/1-7/28 Daptomycin 7/1-8/9  Assessment and Plan: Lucas Alfordis a 49 y.o.malewith Enterococcus endocarditis. PMH is significant foratrial flutter s/p ablation, T2DM, ESRD, hyperlipidemia, anemia, CHF, GERD, polysubstance abuseandhomelessness.   Enterococcus endocarditis Patienthas been compliant with his treatmentthusfar. He recognizes that he may be in the hospital for an extended period.CVTS noted that he is not a good surgical candidate due to his significant comorbidities and current social situation. They advised following up in clinic following completion of his antibiotic course. -Nephrologyfollowing,patient stable for discharge from renal standpoint -Infectious disease sign off, 7/3, will f/u with patient 7/26 -ContinueLinezolidPOday13/28 -Continue IV daptomycin day13/42,consult pharmacy for dosing with HD -weekly labs: CBC w/ diff, BMP, CK -Continue to follow-up on repeat blood cultures from after his line was pulled - Awaiting CBC, patientmost recentHgb7.24from 06/24/2020, willnot transfuse at this time.  Patientpreviouslyreportedbleedingafter the procedure prior to IR suturing the area at the bedside.  Infected dialysis catheter Following a line holiday, and new leftnontunneledIJ catheter was placed on 7/2.New IJ tunneled catheter placed7/7, patient had bleeding after the surgery. IR sutured with dissolvable sutures at the bedside, no f/u needed. -Continue to monitor catheter site -Back on TTS schedule for dialysis -Tramadol twice daily as needed for pain -Flexeril 5mg  q 8hrs PRN for muscle pain -Continue to monitor CBCs  Right shoulder pain Patient rates his right shoulder pain as a 1/10. Patient complaining of right shoulder pain that he rated a 10/102 days ago, yesterday 8/10. He previously described it as a dull, achy pain.It may be due to his osteoarthritis which was seen on x-ray of right shoulder.Pain is elicitedon both active andpassive ROM. Oxycodone seemed to provide adequate pain relief that allowed him to sleep at night and were given multiple times. Voltaren gel was previously used, patient stated it helped until now when the pain has suddenly worsened. Today patient maintains more active and passiveROM. Patient did not have any overnight events relating to his recurrent shoulder pain, the increase in gabapentin may have helped with partial pain relief.  -Continue gabapentin300 mg -Voltaren gel, tylenol, flexeril as needed for pain  -Continue PT -Continue to monitor -May consider imaging if worsening symptoms -Include PT recommendation of outpatient physical therapy 3 times a week in discharge summary   Right chest/neck pain Stable. Patient noticedsome right chest/neck pain since his admission. Patientpain appears to be managed well with voltaren gel.His chest pain has not bothered him for a few daysand denies chest pain today.We will continue to monitor as X ray has a low sensitivity for osteomyelitis and therefore it cannot be completly ruled out. CT  could also be used to detect septic joint. PT recommends outpatient PT, 3x/ week. -Continue to monitor - Flexeril 5mg  q 8hrs PRN for likely muscle pain - May consider CT of shoulder if pain continues  - Voltaren  gel 4x daily scheduled - Continue PT for shoulder  -Include PT recommendation of outpatient physical therapy 3 times a week in discharge summary  Right forearm swelling Stable.No swelling noted, likely resolved.Possible thrombus in the right upper extremityhas been ruled out.  -VENOUSVAS US of the upper extremities found no evidence of DVT in the UE, no evidence of superficial vein thrombosis, no evidence of thrombosis in the subclavian. - Will continue to monitor -ContinueEliquis   Leftbicep pain Patienthas previouslyreportedthat he began having pain in hismedial,left bicep after procedurebut denies experiencing any pain currently. The painat the time wasconstant, but is worse with movement. The pain follows a cord-like pattern. The patient also reports that he feels more short of breath after his surgery. But denies both dyspnea and left biceps pain. -Performed venous US of L upper extremitywhich demonstrated no evidence of DVT or superficial vein thrombosis in the left upper extremity.  Type 2 diabetes Most recentHgb A1c11.5 on 06/09/2020. -ContinueLantus -Continue to monitor blood glucose levels and adjust as needed -SSI moderate -At bedtime coverage -Continue gabapentin for diabetic neuropathy -Ordered prostheticsfor bilateral lower extremityper PT recs -Consider encouraging patient to follow-up with PCP regarding proper diabetes management within the outpatient setting  Hypertension Patient'sBP today 103/83.He denies chest painand dyspnea.Systolic blood pressures havepreviouslyranged between115 and141. -Continue amlodipine 10mg and hydralazine 100 mgtid daily -Continue to monitor vitals including BP  Atrial flutter s/p  ablation Patient's home medication include amiodarone and apixaban -Continueamiodarone -ContinueEliquis -Continuous cardiac monitoring  Hyperlipidemia -Atorvastatin 40 mg -Consider discussion with patient on importance of relative measures by incorporating appropriate lifestyle modifications including healthy diet and adequate physical activity to work in conjunction with current pharmacologic therapy.  Bipolar disorder Patient currently denies any symptoms including racing thoughts, mood is appropriate. -Abilify 5 mg daily  CHF -Continue to monitor fluid status -Dialysis TTS  GERD -Patient denies any reflux and epigastric pain at this time. -ContinueProtonix  Polysubstance abuse We will ensure that he is accompaniedwhenhe is allotted time outside. Nurse found patient ingesting "chalk." Bag was still in the room, most of the bag was in [place], but it was determined that the chalk was actually clay, which the patient confirmed. Patient reportedthat this is a cultural remedy for ingesting Vitamins. Patient was under the impression that the chalk was from [facility] where his family is from and had previously participated in the practice of ingesting chalk for nutrients. The bag has been removed from the patient's room and placed with his possessions.  - Continue to monitor patient  Hx ofHomelessness Per chart review patient has history of homelessness. Patient reports that heisnot homeless at this time and that he is moving back to [facility] soon. Patient does acknowledge that he missed his dialysis appointment because his ride to the dialysis appointment fell through. -Social work consulted for confirmation that patient has stable living situation  Buttocks wound Patient denies any complaints at this time. Patient denies any associated pain.  -We will continue to monitor for any changes insymptoms  Scrotal swelling Patient complained of scrotal pain on afternoon of  06/25/2020. He still rates the pain as a 10/10 and describes it as excruciating. He was unable to characterize the pain as either sharp or dull and achy. Patient states that it had been going on for many days but did not mention it during any of my previous visits, including earlier that day when I went to see him during dialysis. Patient complains that it hurts more with movement or any position changes, he  also admits that the swelling has increased since the time the associated symptoms started. Scrotal ultrasound revealed diffuse scrotal wall edema present.  He was also given a one-time dose of  tramadol for pain relief. Potential causes of this could be from cellulitis or fluid overload. Although cellulitis less likely given patient is already being treated with antibiotics. Patient not having any output and denies dysuria.  -Given oxycodone 5 mg this morning  -Continue to ice and elevate  -Continue dialysis  -Monitor I/Os -Continue to monitor    FEN/GI: Renalcarb modified PPx: Eliquis  Disposition: Inpatient  Subjective:  Patient was seen and examined at bedside. He states that he still has pain in his scrotum that has not subsided with the tramadol. This morning, he was given oxycodone to provide some pain relief. Patient started putting ice on the affected area and elevating it with a towel. He denies any chest pain, dyspnea, nausea, vomiting and dysuria. He admits to scrotal pain but denies any right shoulder pain that has previously been present.   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   Recent Labs  Lab 06/20/20 0714 06/22/20 1257 06/24/20 0434  NA 132* 131* 132*  K 4.8 5.0 4.9  CL 97* 94* 95*  CO2 22 23 21*  BUN 82* 66* 65*  CREATININE 9.59* 8.89* 8.06*  CALCIUM 8.9 9.0 8.9  GLUCOSE 174* 180* 93      Imaging/Diagnostic Tests: US SCROTUM W/DOPPLER  Result Date: 06/25/2020 CLINICAL DATA:  Scrotal swelling EXAM: SCROTAL ULTRASOUND DOPPLER ULTRASOUND OF THE TESTICLES TECHNIQUE: Complete ultrasound examination of the testicles, epididymis, and other scrotal structures was performed. Color and spectral Doppler ultrasound were also utilized to evaluate blood flow to the testicles. COMPARISON:  None. FINDINGS: Right testicle Measurements: 6.3 x 3.1 x 2.8 cm. The right testicle demonstrates a prominent cleft which may relate to remote trauma or represent a a congenital abnormality. No intra testicular mass or calcification, however, is seen. There is normal parenchymal echogenicity and echotexture. Left testicle Measurements: 3.9 x 2.5 x 3.0 cm. No mass or microlithiasis visualized. Right epididymis:  Normal in size and appearance. Left epididymis:  Normal in size and appearance. Hydrocele:  None visualized. Varicocele:  None visualized. Pulsed Doppler interrogation of both testes demonstrates normal low resistance arterial and venous waveforms bilaterally. Scrotum: There is diffuse scrotal wall edema noted. IMPRESSION: Diffuse scrotal wall edema. Prominent cleft within the right testicle which may relate to remote trauma. Otherwise normal  examination of the testes with normal bilateral testicular vascularity identified. Electronically Signed   By: Fidela Salisbury  MD   On: 06/25/2020 21:48    Donney Dice, DO 06/26/2020, 6:45 AM PGY-1, [facility] Intern pager: [phone], text pages welcome

## 2020-06-27 ENCOUNTER — Inpatient Hospital Stay (HOSPITAL_COMMUNITY): Payer: Medicaid Other

## 2020-06-27 LAB — BASIC METABOLIC PANEL
Anion gap: 18 — ABNORMAL HIGH (ref 5–15)
BUN: 66 mg/dL — ABNORMAL HIGH (ref 6–20)
CO2: 20 mmol/L — ABNORMAL LOW (ref 22–32)
Calcium: 8.9 mg/dL (ref 8.9–10.3)
Chloride: 94 mmol/L — ABNORMAL LOW (ref 98–111)
Creatinine, Ser: 7.63 mg/dL — ABNORMAL HIGH (ref 0.61–1.24)
GFR calc Af Amer: 9 mL/min — ABNORMAL LOW (ref >60)
GFR calc non Af Amer: 8 mL/min — ABNORMAL LOW (ref >60)
Glucose, Bld: 184 mg/dL — ABNORMAL HIGH (ref 70–99)
Potassium: 4.8 mmol/L (ref 3.5–5.1)
Sodium: 132 mmol/L — ABNORMAL LOW (ref 135–145)

## 2020-06-27 LAB — GLUCOSE, CAPILLARY
Glucose-Capillary: 182 mg/dL — ABNORMAL HIGH (ref 70–99)
Glucose-Capillary: 118 mg/dL — ABNORMAL HIGH (ref 70–99)
Glucose-Capillary: 156 mg/dL — ABNORMAL HIGH (ref 70–99)
Glucose-Capillary: 148 mg/dL — ABNORMAL HIGH (ref 70–99)

## 2020-06-27 LAB — CBC
HCT: 22.9 % — ABNORMAL LOW (ref 39.0–52.0)
Hemoglobin: 7 g/dL — ABNORMAL LOW (ref 13.0–17.0)
MCH: 28.5 pg (ref 26.0–34.0)
MCHC: 30.6 g/dL (ref 30.0–36.0)
MCV: 93.1 fL (ref 80.0–100.0)
Platelets: 212 10*3/uL (ref 150–400)
RBC: 2.46 MIL/uL — ABNORMAL LOW (ref 4.22–5.81)
RDW: 17.7 % — ABNORMAL HIGH (ref 11.5–15.5)
WBC: 10.1 10*3/uL (ref 4.0–10.5)
nRBC: 0.3 % — ABNORMAL HIGH (ref 0.0–0.2)

## 2020-06-27 MED ORDER — OXYCODONE HCL 5 MG PO TABS
5.0000 mg | ORAL_TABLET | Freq: Once | ORAL | Status: AC
  Administered 2020-06-27: 5 mg via ORAL
  Filled 2020-06-27: qty 1

## 2020-06-27 MED ORDER — HEPARIN SODIUM (PORCINE) 1000 UNIT/ML IJ SOLN
INTRAMUSCULAR | Status: AC
  Administered 2020-06-27: 3800 [IU]
  Filled 2020-06-27: qty 4

## 2020-06-27 MED ORDER — OXYCODONE-ACETAMINOPHEN 5-325 MG PO TABS
1.0000 | ORAL_TABLET | Freq: Once | ORAL | Status: AC
  Administered 2020-06-27: 1 via ORAL
  Filled 2020-06-27: qty 1

## 2020-06-27 MED ORDER — DOXERCALCIFEROL 4 MCG/2ML IV SOLN
INTRAVENOUS | Status: AC
  Administered 2020-06-27: 4 ug
  Filled 2020-06-27: qty 2

## 2020-06-27 MED ORDER — IOHEXOL 300 MG/ML  SOLN
100.0000 mL | Freq: Once | INTRAMUSCULAR | Status: AC | PRN
  Administered 2020-06-27: 100 mL via INTRAVENOUS

## 2020-06-27 NOTE — Plan of Care (Signed)
  Problem: Safety: Goal: Ability to remain free from injury will improve Outcome: Progressing   

## 2020-06-27 NOTE — Progress Notes (Signed)
Pharmacy Antibiotic Note  Lucas [name] is a 49 y.o. male admitted on 06/09/2020 with enterococcal bacteremia. Now found to have a mitral valve vegetation. Pharmacy has been consulted for Daptomycin dosing. Also on Zyvox for Gent-R Enterococcal MV IE.    The patient is ESRD - s/ptunneled HD catheter per IR7/6/21.  Pt is now on routine TTS dialysis schedule and tolerting. Adjusted body weight ~110 kg.   Possible extra UF session 7/16, will not need extra daptomycin dose.   Plan: - Daptomycin 800 mg IV following HD Tues/Thur and 1000 mg following HD on Sat until 8/9  - Continue Zyvox 600 mg po every 12 hours until 7/28 - Will continue to monitor HD tolerance and schedule - Q Monday CK     Height: 6\' 4"  (193 cm) Weight: (!) 148.8 kg (328 lb) IBW/kg (Calculated) : 86.8  Temp (24hrs), Avg:97.8 F (36.6 C), Min:97.4 F (36.3 C), Max:98.7 F (37.1 C)  Recent Labs  Lab 06/22/20 0756 06/22/20 1257 06/24/20 0434 06/26/20 1529 06/27/20 0458  WBC 11.3*  --  10.1 10.6* 10.1  CREATININE  --  8.89* 8.06* 7.04* 7.63*    Estimated Creatinine Clearance: 18.7 mL/min (A) (by C-G formula based on SCr of 7.63 mg/dL (H)).    No Known Allergies   Linezolid 7/1>> (7/29) Dapto 7/1> (8/9)  Vanc 6/28>>7/1 Gent 6/29>> 6/30  7/2 CK 66 7/5 CK 78 7/12 CK 169    6/27 MRSA PCR + 6/27 BCx: Enterococcus faecalis (BCID Enterococcus) (S-amp, vanc) Gent synergy resistant 6/28 BCx: Enterococcus faecalis 6/30 BCx: ngtd   Thank you for allowing pharmacy to be a part of this patient's care.  Benetta Spar, PharmD, BCPS, BCCP Clinical Pharmacist  Please check AMION for all [place] phone numbers After 10:00 PM, call [place] [phone]

## 2020-06-27 NOTE — Progress Notes (Signed)
PT Cancellation Note  Patient Details Name: Lucas [name] MRN: [id number] DOB: 1971-05-29   Cancelled Treatment:    Reason Eval/Treat Not Completed: (P) Patient at procedure or test/unavailable Pt is off floor in HD. PT will follow back this afternoon as able.   Deannah Rossi B. Migdalia Dk PT, DPT Acute Rehabilitation Services Pager [phone] Office [phone]    [place] 06/27/2020, 11:46 AM

## 2020-06-27 NOTE — Progress Notes (Signed)
Family Medicine Teaching Service Daily Progress Note Intern Pager: [phone]  Patient name: Lucas [name] Medical record number: [id number] Date of birth: 02-Aug-1971 Age: 49 y.o. Gender: male  Primary Care Provider: System, Pcp Not In Consultants: Nephrology, cardiology, infectious disease Code Status: Full Code  Pt Overview and Major Events to Date:  Admitted for hyperglycemia and port pain on 06/09/2020 Enterococcus grew on blood cultures on 06/10/2020 TTE demonstrated mitral valve vegetation on 06/11/2020 Removal of right IJ HD catheter on 06/11/2020 TEE completed on 06/13/2020 Patient received left IJ nontunneled, restarted HD on 06/14/2020 Patient received long-term tunneled left IJ on 06/19/2020  Antibiotics Patient's current antibiotics and duration listed below. Gentamicin 6/29-6/30 Vancomycin 6/28-6/29 Linezolid 7/1-7/28 Daptomycin 7/1-8/9   Assessment and Plan: Lenardo [name] a 49 y.o.malewith Enterococcus endocarditis. PMH is significant foratrial flutter s/p ablation, T2DM, ESRD, hyperlipidemia, anemia, CHF, GERD, polysubstance abuseandhomelessness.  Enterococcus endocarditis Patienthas been compliant with his treatmentthusfar. He recognizes that he may be in the hospital for an extended period.CVTS noted that he is not a good surgical candidate due to his significant comorbidities and current social situation. They advised following up in clinic following completion of his antibiotic course. -Nephrologyfollowing,patient stable for discharge from renal standpoint -Infectious disease sign off, 7/3, will f/u with patient 7/26 -ContinueLinezolidPOday14/28 -Continue IV daptomycin day14/42,consult pharmacy for dosing with HD -weekly labs: CBC w/ diff, BMP, CK -Continue to follow-up on repeat blood cultures from after his line was pulled - Awaiting CBC, patientmost recentHgb7.0 today willnot transfuse at this time. Patientpreviouslyreportedbleedingafter the  procedure prior to IR suturing the area at the bedside.  Infected dialysis catheter Following a line holiday, and new leftnontunneledIJ catheter was placed on 7/2.New IJ tunneled catheter placed7/7, patient had bleeding after the surgery. IR sutured with dissolvable sutures at the bedside, no f/u needed. -Continue to monitor catheter site -Back on TTS schedule for dialysis -Tramadol twice daily as needed for pain -Flexeril 5mg  q 8hrs PRN for muscle pain -Continue to monitor CBCs  Right shoulder pain Patient right sided shoulder pain most likely resolved, he denies any pain associated with it today. Patient rated his shoulder pain as a 10/10 7/12 and 1/10 on 7/14. Hepreviously described it as a dull, achy pain.Itmay be due to his osteoarthritis which was seen on x-ray of right shoulder.Pain is elicitedon both active andpassive ROM. Oxycodone seemed to provide adequate pain relief that allowed him to sleep at night and were given multiple times. Voltaren gel was previously used, patient stated it helped until now when the pain has suddenly worsened.Todaypatient maintains more active and passiveROM. Patient did not have any overnight events relating to his recurrent shoulder pain, the increase in gabapentin may have helped with partial pain relief. -Continuegabapentin300 mg -Voltaren gel, tylenol, flexeril as needed for pain -Continue PT -Continue to monitor -May consider imaging if worsening symptoms -Include PT recommendation of outpatient physical therapy 3 times a week in discharge summary   Right chest/neck pain Stable. Patient noticedsome right chest/neck pain since his admission. Patientpain appears to be managed well with voltaren gel.His chest pain has not bothered him for a few daysand denies chest pain today.We will continue to monitor as X ray has a low sensitivity for osteomyelitis and therefore it cannot be completly ruled out. CT could also be used to  detect septic joint. PT recommends outpatient PT, 3x/ week. -Continue to monitor - Flexeril 5mg  q 8hrs PRN for likely muscle pain - May consider CT of shoulder if pain continues  - Voltaren gel 4x  daily scheduled - Continue PT for shoulder  -Include PT recommendation of outpatient physical therapy 3 times a week in discharge summary  Right forearm swelling Stable.No swelling noted, likely resolved.Possible thrombus in the right upper extremityhas been ruled out.  -VENOUSVAS US of the upper extremities found no evidence of DVT in the UE, no evidence of superficial vein thrombosis, no evidence of thrombosis in the subclavian. - Will continue to monitor -ContinueEliquis   Leftbicep pain Patienthas previouslyreportedthat he began having pain in hismedial,left bicep after procedurebut denies experiencing any pain currently. The painat the time wasconstant, but is worse with movement. The pain follows a cord-like pattern. The patient also reports that he feels more short of breath after his surgery. But denies both dyspnea and left biceps pain. -Performed venous US of L upper extremitywhich demonstrated no evidence of DVT or superficial vein thrombosis in the left upper extremity.  Type 2 diabetes Most recentHgb A1c11.5 on 06/09/2020.Blood glucose 184 this morning. -ContinueLantus -Continue to monitor blood glucose levels and adjust as needed -SSI moderate -At bedtime coverage -Continue gabapentin for diabetic neuropathy -Ordered prostheticsfor bilateral lower extremityper PT recs -Consider encouraging patient to follow-up with PCP regarding proper diabetes management within the outpatient setting  Hypertension Patient'sBPtoday 138/88.He denies chest painanddyspnea.Systolic blood pressures havepreviouslyranged between115 and141. -Continue amlodipine 5mg and hydralazine 100 mgtid daily -Continue to monitor vitals including BP  Atrial flutter  s/p ablation Patient's home medication include amiodarone and apixaban -Continueamiodarone -ContinueEliquis -Continuous cardiac monitoring  Hyperlipidemia -Atorvastatin 40 mg -Consider discussion with patient on importance of relative measures by incorporating appropriate lifestyle modifications including healthy diet and adequate physical activity to work in conjunction with current pharmacologic therapy.  Bipolar disorder Patient currently denies any symptoms including racing thoughts, mood is appropriate. -Abilify 5 mg daily  CHF -Continue to monitor fluid status -Dialysis TTS -Continue to monitor I/Os   GERD -Patient denies any reflux and epigastric pain at this time. -ContinueProtonix  Polysubstance abuse We will ensure that he is accompaniedwhenhe is allotted time outside. Nurse found patient ingesting "chalk." Bag was still in the room, most of the bag was in [place], but it was determined that the chalk was actually clay, which the patient confirmed. Patient reportedthat this is a cultural remedy for ingesting Vitamins. Patient was under the impression that the chalk was from [facility] where his family is from and had previously participated in the practice of ingesting chalk for nutrients. The bag has been removed from the patient's room and placed with his possessions.  - Continue to monitor patient  Hx ofHomelessness Per chart review patient has history of homelessness. Patient reports that heisnot homeless at this time and that he is moving back to [facility] soon. Patient does acknowledge that he missed his dialysis appointment because his ride to the dialysis appointment fell through. -Social work consulted for confirmation that patient has stable living situation  Buttocks wound Patient denies any complaints at this time. Patient denies any associated pain. -We will continue to monitor for any changes insymptoms  Scrotal swelling Patient complained  of scrotal pain on afternoon of 06/25/2020. He still rates the pain as a 10/10 and describes it as excruciating. He was unable to characterize the pain as either sharp or dull and achy. Patient states that it had been going on for many days but did not mention it during any of my previous visits, including earlier that day when I went to see him during dialysis. Patient complains that it hurts more with movement or  any position changes, he also admits that the swelling has increased since the time the associated symptoms started. Scrotal ultrasound revealed diffuse scrotal wall edema present.  He was also given a one-time dose of  tramadol for pain relief. Potential causes of this could be from cellulitis or fluid overload. Although cellulitis less likely given patient is already being treated with antibiotics. Patient not having any output and denies dysuria. Today, patient's swelling looks significantly better.  -CT pelvis w/ contrast ordered: spoke with Dr. Johnney Ou, nephrologist, to approve of imaging with contrast, rule our fournier gangrene  -Continue to ice and elevate -Continue dialysis  -Monitor I/Os -Continue to monitor   FEN/GI: Renalcarb modified PPx: Eliquis  Disposition: Inpatient  Subjective:  Patient was seen and examined in hemodialysis. When I walked in, her was comfortably laying in bed and eating his breakfast. He still endorses a 10/10 and states that the oxycodone and vicodin did not work overnight. He was placed in a scrotal sling but did not have that in place during my encounter. He wants more pain medication. He denies chest pain, dyspnea, dizziness and right shoulder pain. Patient states that his right shoulder pain has completely resolved and is able to have full ROM. Ordered CT pelvis w/ contrast for recurrent scrotal pain to rule our fournier gangrene. Spoke with Dr. Johnney Ou (nephrology) said it was appropriate to order imaging with contrast given patient's history of ESRD.    Objective: Temp:  [97.4 F (36.3 C)-98.9 F (37.2 C)] 97.6 F (36.4 C) (07/15 0705) Pulse Rate:  [81-136] 88 (07/15 0830) Resp:  [18-20] 18 (07/15 0705) BP: (103-138)/(63-103) 127/74 (07/15 0830) SpO2:  [95 %-98 %] 96 % (07/15 0705) Weight:  [153.8 kg-155.1 kg] 153.8 kg (07/15 0705) Physical Exam: General: Patient laying comfortably in bed, in no acute distress.  Cardiovascular: regular rate and rhythm, no murmurs or gallops noted Respiratory: lungs clear to auscultation bilaterally  Abdomen: nontender, distended, active bowel sounds  Extremities: knee amputation from below the knee bilaterally  GU: scrotum significantly less swollen  Laboratory: Recent Labs  Lab 06/24/20 0434 06/26/20 1529 06/27/20 0458  WBC 10.1 10.6* 10.1  HGB 7.3* 7.5* 7.0*  HCT 23.5* 24.4* 22.9*  PLT 207 228 212   Recent Labs  Lab 06/24/20 0434 06/26/20 1529 06/27/20 0458  NA 132* 133* 132*  K 4.9 4.4 4.8  CL 95* 93* 94*  CO2 21* 25 20*  BUN 65* 55* 66*  CREATININE 8.06* 7.04* 7.63*  CALCIUM 8.9 8.9 8.9  GLUCOSE 93 153* 184*      Imaging/Diagnostic Tests: US SCROTUM W/DOPPLER  Result Date: 06/25/2020 CLINICAL DATA:  Scrotal swelling EXAM: SCROTAL ULTRASOUND DOPPLER ULTRASOUND OF THE TESTICLES TECHNIQUE: Complete ultrasound examination of the testicles, epididymis, and other scrotal structures was performed. Color and spectral Doppler ultrasound were also utilized to evaluate blood flow to the testicles. COMPARISON:  None. FINDINGS: Right testicle Measurements: 6.3 x 3.1 x 2.8 cm. The right testicle demonstrates a prominent cleft which may relate to remote trauma or represent a a congenital abnormality. No intra testicular mass or calcification, however, is seen. There is normal parenchymal echogenicity and echotexture. Left testicle Measurements: 3.9 x 2.5 x 3.0 cm. No mass or microlithiasis visualized. Right epididymis:  Normal in size and appearance. Left epididymis:  Normal in size and  appearance. Hydrocele:  None visualized. Varicocele:  None visualized. Pulsed Doppler interrogation of both testes demonstrates normal low resistance arterial and venous waveforms bilaterally. Scrotum: There is diffuse scrotal wall edema noted.  IMPRESSION: Diffuse scrotal wall edema. Prominent cleft within the right testicle which may relate to remote trauma. Otherwise normal examination of the testes with normal bilateral testicular vascularity identified. Electronically Signed   By: Fidela Salisbury MD   On: 06/25/2020 21:48    Donney Dice, DO 06/27/2020, 9:29 AM PGY-1, [place] Intern pager: [phone], text pages welcome

## 2020-06-27 NOTE — Progress Notes (Signed)
Late Entry: Navigator met with patient at HD bedside to update him of transportation to OP HD at discharge. Navigator informed patient that [facility] will NOT be transporting him due to lack of availability, but that [facility] has confirmed that they ARE ABLE to provide him with transportation from [facility] to [facility] (he needs to ensure that he has kept his address up to date with [facility]) for dialysis. They have switched his transportation to [name]. Navigator informed him that Medicaid needs to be called (which Navigator will do) at his discharge to ensure that his standing order for transportation will be resumed. Patient stated understanding and appreciation.  Alphonzo Cruise, [facility] Renal Navigator [phone]

## 2020-06-27 NOTE — Plan of Care (Signed)
  Problem: Coping: Goal: Level of anxiety will decrease Outcome: Progressing   Problem: Fluid Volume: Goal: Compliance with measures to maintain balanced fluid volume will improve Outcome: Progressing   Problem: Nutritional: Goal: Ability to make healthy dietary choices will improve Outcome: Progressing

## 2020-06-27 NOTE — Progress Notes (Signed)
Patient ID: Lucas [name], male   DOB: 1971-08-23, 49 y.o.   MRN: [id number] S: Seen on HD.  No new complaints.    O:BP 127/74   Pulse 88   Temp 97.6 F (36.4 C) (Oral)   Resp 18   Ht 6\' 4"  (1.93 m)   Wt (!) 153.8 kg   SpO2 96%   BMI 41.26 kg/m   Intake/Output Summary (Last 24 hours) at 06/27/2020 0840 Last data filed at 06/27/2020 0600 Gross per 24 hour  Intake 1320 ml  Output 0 ml  Net 1320 ml   Intake/Output: I/O last 3 completed shifts: In: 5638 [P.O.:1640; IV Piggyback:116] Out: 0   Intake/Output this shift:  No intake/output data recorded. Weight change:  Gen: NAD, periorbital edema noted CVS: no rub Resp: cta Abd: distended, + wall edema Ext: s/p bilateral BKAs with 1+ edema   Recent Labs  Lab 06/22/20 1257 06/24/20 0434 06/26/20 1529 06/27/20 0458  NA 131* 132* 133* 132*  K 5.0 4.9 4.4 4.8  CL 94* 95* 93* 94*  CO2 23 21* 25 20*  GLUCOSE 180* 93 153* 184*  BUN 66* 65* 55* 66*  CREATININE 8.89* 8.06* 7.04* 7.63*  ALBUMIN 2.5*  --   --   --   CALCIUM 9.0 8.9 8.9 8.9  PHOS 5.3*  --   --   --    Liver Function Tests: Recent Labs  Lab 06/22/20 1257  ALBUMIN 2.5*   No results for input(s): LIPASE, AMYLASE in the last 168 hours. No results for input(s): AMMONIA in the last 168 hours. CBC: Recent Labs  Lab 06/22/20 0756 06/22/20 1634 06/24/20 0434 06/26/20 1529 06/27/20 0458  WBC 11.3*  --  10.1 10.6* 10.1  NEUTROABS 9.6*  --  8.2*  --   --   HGB 7.0*   < > 7.3* 7.5* 7.0*  HCT 23.1*   < > 23.5* 24.4* 22.9*  MCV 93.9  --  93.6 93.8 93.1  PLT 240  --  207 228 212   < > = values in this interval not displayed.   Cardiac Enzymes: Recent Labs  Lab 06/24/20 0434  CKTOTAL 169   CBG: Recent Labs  Lab 06/26/20 0638 06/26/20 1115 06/26/20 1635 06/26/20 2057 06/27/20 0640  GLUCAP 152* 178* 159* 206* 182*    Iron Studies:  No results for input(s): IRON, TIBC, TRANSFERRIN, FERRITIN in the last 72 hours. Studies/Results: US SCROTUM  W/DOPPLER  Result Date: 06/25/2020 CLINICAL DATA:  Scrotal swelling EXAM: SCROTAL ULTRASOUND DOPPLER ULTRASOUND OF THE TESTICLES TECHNIQUE: Complete ultrasound examination of the testicles, epididymis, and other scrotal structures was performed. Color and spectral Doppler ultrasound were also utilized to evaluate blood flow to the testicles. COMPARISON:  None. FINDINGS: Right testicle Measurements: 6.3 x 3.1 x 2.8 cm. The right testicle demonstrates a prominent cleft which may relate to remote trauma or represent a a congenital abnormality. No intra testicular mass or calcification, however, is seen. There is normal parenchymal echogenicity and echotexture. Left testicle Measurements: 3.9 x 2.5 x 3.0 cm. No mass or microlithiasis visualized. Right epididymis:  Normal in size and appearance. Left epididymis:  Normal in size and appearance. Hydrocele:  None visualized. Varicocele:  None visualized. Pulsed Doppler interrogation of both testes demonstrates normal low resistance arterial and venous waveforms bilaterally. Scrotum: There is diffuse scrotal wall edema noted. IMPRESSION: Diffuse scrotal wall edema. Prominent cleft within the right testicle which may relate to remote trauma. Otherwise normal examination of the testes with normal bilateral testicular  vascularity identified. Electronically Signed   By: Fidela Salisbury MD   On: 06/25/2020 21:48   . sodium chloride   Intravenous Once  . acetaminophen  1,000 mg Oral Q6H WA  . amiodarone  200 mg Oral Daily  . amLODipine  5 mg Oral Daily  . apixaban  5 mg Oral BID  . ARIPiprazole  5 mg Oral Daily  . atorvastatin  40 mg Oral Daily  . carvedilol  12.5 mg Oral BID  . Chlorhexidine Gluconate Cloth  6 each Topical Q0600  . Chlorhexidine Gluconate Cloth  6 each Topical Q0600  . darbepoetin (ARANESP) injection - DIALYSIS  150 mcg Intravenous Q Sat-HD  . diclofenac Sodium  4 g Topical QID  . doxercalciferol  4 mcg Oral Q T,Th,Sa-HD  . gabapentin  300 mg Oral  QHS  . hydrALAZINE  100 mg Oral TID  . insulin aspart  0-15 Units Subcutaneous TID WC  . insulin aspart  0-5 Units Subcutaneous QHS  . insulin glargine  20 Units Subcutaneous Daily  . lidocaine  1 patch Transdermal Q24H  . linezolid  600 mg Oral Q12H  . melatonin  5 mg Oral QHS  . pantoprazole  40 mg Oral Daily  . polyethylene glycol  17 g Oral Daily  . QUEtiapine  100 mg Oral QHS  . sevelamer carbonate  1,600 mg Oral TID with meals  . sodium chloride flush  10-40 mL Intracatheter Q12H  . tamsulosin  0.4 mg Oral Daily    BMET    Component Value Date/Time   NA 132 (L) 06/27/2020 0458   K 4.8 06/27/2020 0458   CL 94 (L) 06/27/2020 0458   CO2 20 (L) 06/27/2020 0458   GLUCOSE 184 (H) 06/27/2020 0458   BUN 66 (H) 06/27/2020 0458   CREATININE 7.63 (H) 06/27/2020 0458   CALCIUM 8.9 06/27/2020 0458   GFRNONAA 8 (L) 06/27/2020 0458   GFRAA 9 (L) 06/27/2020 0458   CBC    Component Value Date/Time   WBC 10.1 06/27/2020 0458   RBC 2.46 (L) 06/27/2020 0458   HGB 7.0 (L) 06/27/2020 0458   HCT 22.9 (L) 06/27/2020 0458   PLT 212 06/27/2020 0458   MCV 93.1 06/27/2020 0458   MCH 28.5 06/27/2020 0458   MCHC 30.6 06/27/2020 0458   RDW 17.7 (H) 06/27/2020 0458   LYMPHSABS 0.6 (L) 06/24/2020 0434   MONOABS 1.0 06/24/2020 0434   EOSABS 0.1 06/24/2020 0434   BASOSABS 0.0 06/24/2020 0434   [facility]) Phone [phone] Scheduled TTS 4 hours His outpatient unit reports noncompliance BF 400; DF 800 F250 dialyzer  2/2.5 ca bath EDW 250 lbs Last weight 272.2 lbs on 6/24 (signed off early and had skipped the treatment prior) aranesp 120 mcg weekly  hectoral 4 mcg each tx  Assessment/Plan:  1. Enterococcus bacteremia/Mitral valve Endocarditis- due to tunneled HD catheter. TDC removed 6/29 s/p temp HD catheter placed 06/14/20.Now with new LIJ [place] placed 7/6. Will continue withIVcubicin andoralzyvoxper ID 1. Not candidate for surgery due to  multiple co-morbidities, social issues, and polysubstance abuse 2. Noted vegetation on TTE 6/29 3. daptomycin through 07/22/20 and oral linezolid for 4 weeks (his home HD unit does have daptomycin) 4. Follow up with ID on 07/08/20 2. ESRDcontinue with TTS schedule; catheter replaced by IR on 06/18/20. 1. TTS schedule here - HD today UF 5.5L.  AGAIN reiterated fluid restriction.  Will attempt sequential tx tomorrow for extra UF if schedule allows.  D/w patient and he's agreeable 3. Mitral valve endocarditis- noted on TTE 6/29. ID following. Daptomycin for 6 weeks through 07/22/20, oral linezolid for 4 weeks, follow up with ID on 07/08/20. 4. Anemia:due to ESRD. Continue with ESA. Check Hb 7/15 5. CKD-MBD:continue with home meds 6. Nutrition:renal diet 7. Hypertension:BP low yest AM - dec amlodipine from 10 to 5. 8. Vascular access-s/ptunneled HD catheter per IR7/6/21. 9. Disposition- SW to assist with transportation to his outpatient unit.Stable for discharge from renal standpoint, however there appears to be issues with disposition.   Jannifer Hick MD [facility] Pager [phone]

## 2020-06-28 DIAGNOSIS — R188 Other ascites: Secondary | ICD-10-CM

## 2020-06-28 DIAGNOSIS — K7031 Alcoholic cirrhosis of liver with ascites: Secondary | ICD-10-CM

## 2020-06-28 LAB — BASIC METABOLIC PANEL
Anion gap: 12 (ref 5–15)
BUN: 45 mg/dL — ABNORMAL HIGH (ref 6–20)
CO2: 25 mmol/L (ref 22–32)
Calcium: 8.5 mg/dL — ABNORMAL LOW (ref 8.9–10.3)
Chloride: 98 mmol/L (ref 98–111)
Creatinine, Ser: 6.34 mg/dL — ABNORMAL HIGH (ref 0.61–1.24)
GFR calc Af Amer: 11 mL/min — ABNORMAL LOW (ref >60)
GFR calc non Af Amer: 10 mL/min — ABNORMAL LOW (ref >60)
Glucose, Bld: 138 mg/dL — ABNORMAL HIGH (ref 70–99)
Potassium: 4.2 mmol/L (ref 3.5–5.1)
Sodium: 135 mmol/L (ref 135–145)

## 2020-06-28 LAB — CBC
HCT: 23.5 % — ABNORMAL LOW (ref 39.0–52.0)
Hemoglobin: 7 g/dL — ABNORMAL LOW (ref 13.0–17.0)
MCH: 28.2 pg (ref 26.0–34.0)
MCHC: 29.8 g/dL — ABNORMAL LOW (ref 30.0–36.0)
MCV: 94.8 fL (ref 80.0–100.0)
Platelets: 197 10*3/uL (ref 150–400)
RBC: 2.48 MIL/uL — ABNORMAL LOW (ref 4.22–5.81)
RDW: 17.9 % — ABNORMAL HIGH (ref 11.5–15.5)
WBC: 8.3 10*3/uL (ref 4.0–10.5)
nRBC: 0.6 % — ABNORMAL HIGH (ref 0.0–0.2)

## 2020-06-28 LAB — HEPATIC FUNCTION PANEL
ALT: 8 U/L (ref 0–44)
AST: 22 U/L (ref 15–41)
Albumin: 2.4 g/dL — ABNORMAL LOW (ref 3.5–5.0)
Alkaline Phosphatase: 308 U/L — ABNORMAL HIGH (ref 38–126)
Bilirubin, Direct: 0.2 mg/dL (ref 0.0–0.2)
Indirect Bilirubin: 0.3 mg/dL (ref 0.3–0.9)
Total Bilirubin: 0.5 mg/dL (ref 0.3–1.2)
Total Protein: 7.7 g/dL (ref 6.5–8.1)

## 2020-06-28 LAB — GLUCOSE, CAPILLARY
Glucose-Capillary: 152 mg/dL — ABNORMAL HIGH (ref 70–99)
Glucose-Capillary: 117 mg/dL — ABNORMAL HIGH (ref 70–99)
Glucose-Capillary: 176 mg/dL — ABNORMAL HIGH (ref 70–99)

## 2020-06-28 LAB — HEPATITIS PANEL, ACUTE
HCV Ab: NONREACTIVE
Hep A IgM: NONREACTIVE
Hep B C IgM: NONREACTIVE
Hepatitis B Surface Ag: NONREACTIVE

## 2020-06-28 MED ORDER — HEPARIN SODIUM (PORCINE) 1000 UNIT/ML DIALYSIS: 20.0000 [IU]/kg | INTRAMUSCULAR | Status: DC | PRN

## 2020-06-28 MED ORDER — NYSTATIN 100000 UNIT/GM EX CREA
TOPICAL_CREAM | Freq: Two times a day (BID) | CUTANEOUS | Status: DC
  Filled 2020-06-28 (×2): qty 15

## 2020-06-28 MED ORDER — HEPARIN SODIUM (PORCINE) 1000 UNIT/ML IJ SOLN
INTRAMUSCULAR | Status: AC
  Filled 2020-06-28: qty 2

## 2020-06-28 MED ORDER — OXYCODONE HCL 5 MG PO TABS
5.0000 mg | ORAL_TABLET | Freq: Once | ORAL | Status: AC
  Administered 2020-06-28: 5 mg via ORAL
  Filled 2020-06-28: qty 1

## 2020-06-28 MED ORDER — OXYCODONE HCL 5 MG PO TABS
5.0000 mg | ORAL_TABLET | Freq: Two times a day (BID) | ORAL | Status: DC | PRN
  Administered 2020-06-28 – 2020-07-07 (×16): 5 mg via ORAL
  Filled 2020-06-28 (×17): qty 1

## 2020-06-28 MED ORDER — OXYCODONE HCL 5 MG PO TABS: 5.0000 mg | ORAL_TABLET | Freq: Two times a day (BID) | ORAL | Status: DC

## 2020-06-28 MED ORDER — NYSTATIN 100000 UNIT/GM EX POWD
Freq: Two times a day (BID) | CUTANEOUS | Status: DC
  Filled 2020-06-28: qty 15

## 2020-06-28 NOTE — Progress Notes (Signed)
Pt refusing to take morning insulin (novolog) stating that if he takes the insulin it will drop his sugar. This RN educated patient multiple of times but he still refused. He said he would his lantus when it was due. Will continue to educate

## 2020-06-28 NOTE — Consult Note (Signed)
WOC Nurse Consult Note: Patient receiving care in [facility] [phone]. Reason for Consult: "R stump draining blood and yellow discharge" Wound type: chronic surgical wound to right stump Pressure Injury POA: Yes/No/NA Measurement: 1.3 cm x 7.5 cm x no measureable depth Wound bed: dry. No drainage, no bleeding, no odor, nothing could be expressed from the wound. There was no dressing to the area. Drainage (amount, consistency, odor)  Periwound: scarred, intact Dressing procedure/placement/frequency:  Wash right stump wound with soap and water. Fold a Xeroform gauze and place it over the wound bed. Top with a foam dressing.  Change daily. Monitor the wound area(s) for worsening of condition such as: Signs/symptoms of infection,  Increase in size,  Development of or worsening of odor, Development of pain, or increased pain at the affected locations.  Notify the medical team if any of these develop.  Thank you for the consult.  Discussed plan of care with the patient.  [place] nurse will not follow at this time.  Please re-consult the [place] team if needed.  Val Riles, RN, MSN, CWOCN, CNS-BC, pager [phone]

## 2020-06-28 NOTE — Progress Notes (Signed)
Family Medicine Teaching Service Daily Progress Note Intern Pager: [phone]  Patient name: Lucas [name] Medical record number: [id number] Date of birth: 01/30/1971 Age: 49 y.o. Gender: male  Primary Care Provider: System, Pcp Not In Consultants: Nephrology, cardiology, infectious disease Code Status: Full Code  Pt Overview and Major Events to Date:  Admitted for hyperglycemia and port pain on 06/09/2020 Enterococcus grew on blood cultures on 06/10/2020 TTE demonstrated mitral valve vegetation on 06/11/2020 Removal of right IJ HD catheter on 06/11/2020 TEE completed on 06/13/2020 Patient received left IJ nontunneled, restarted HD on 06/14/2020 Patient received long-term tunneled left IJ on 06/19/2020  Antibiotics Patient's current antibiotics and duration listed below. Gentamicin 6/29-6/30 Vancomycin 6/28-6/29 Linezolid 7/1-7/28 Daptomycin 7/1-8/9  Assessment and Plan: Lucas Alfordis a 49 y.o.malewith Enterococcus endocarditis. PMH is significant foratrial flutter s/p ablation, T2DM, ESRD, hyperlipidemia, anemia, CHF, GERD, polysubstance abuseandhomelessness.   Enterococcus endocarditis Patienthas been compliant with his treatmentthusfar. He recognizes that he may be in the hospital for an extended period.CVTS noted that he is not a good surgical candidate due to his significant comorbidities and current social situation. They advised following up in clinic following completion of his antibiotic course. -Nephrologyfollowing,patient stable for discharge from renal standpoint -Infectious disease sign off, 7/3, will f/u with patient 7/26 -ContinueLinezolidPOday15/28 -Continue IV daptomycin day15/42,consult pharmacy for dosing with HD -weekly labs: CBC w/ diff, BMP, CK -Continue to follow-up on repeat blood cultures from after his line was pulled -Awaiting CBC,patientmost recentHgb7.0 from 7/15 willnot transfuse at this time. Patientpreviouslyreportedbleedingafter  the procedure prior to IR suturing the area at the bedside.  Scrotal swelling Patient today complaining of 10/10 pain. Patient used voltaren gel yesterday which failed to provide any relief. [place] of scrotum revealed diffuse scrotal wall edema present. Although patient has a history of ESRD, since he is not having any output, Dr. Johnney Ou (nephrologist) agreed that it was appropriate to get imaging with contrast to rule out fournier gangrene. CT pelvis w/ contrast demonstrated early vascular necrosis of the left femoral head, diffuse nonspecific scrotal wall edema, ascites and enlarged inguinal and pelvic lymph nodes. My suspicion is that patient may be experiencing referred pain to his groin in addition to fluid overload which may be contributing to his scrotal edema. I also have concern for potential nerve impingement given his numbness of his left hip radiating down to his knee. Patient had a RUQ [place] in March 2021 showing cirrhosis and paracentesis in April.  -Continue to ice and elevate -Given oxy IR 5mg  overnight  -Continue dialysis -If schedule allows, patient may receive extra filtration session per nephro. Patient was already notified and agrees.  -Monitor I/Os, continue fluid restriction  -Hepatic panel given imaging yielding ascites  -nystatin -awaiting RUQ [place]     Infected dialysis catheter Following a line holiday, and new leftnontunneledIJ catheter was placed on 7/2.New IJ tunneled catheter placed7/7, patient had bleeding after the surgery. IR sutured with dissolvable sutures at the bedside, no f/u needed. -Continue to monitor catheter site -Back on TTS schedule for dialysis -Tramadol twice daily as needed for pain -Flexeril 5mg  q 8hrs PRN for muscle pain -Continue to monitor CBCs  Right shoulder pain Patient right sided shoulder pain most likely resolved, he denies any pain associated with it today. Patient rated his shoulder pain as a 10/10 7/12 and 1/10 on  7/14.Hepreviouslydescribedit as a dull, achy pain.Itmay be due to his osteoarthritis which was seen on x-ray of right shoulder.Pain is elicitedon both active andpassive ROM.Oxycodone seemed toprovide adequate pain relief  that allowed himto sleep at night and were given multiple times.Voltaren gel was previously used, patient stated it helped until now when the pain has suddenly worsened.Todaypatientmaintainsmore active and passive ROM. Patient did not have any overnight events relating to his recurrent shoulder pain, the increase in gabapentin may have helped with partial pain relief. -Continuegabapentin300 mg -Voltaren gel, tylenol, flexeril as needed for pain -Continue PT -Continue to monitor -May consider imaging if worsening symptoms -Include PT recommendation of outpatient physical therapy 3 times a week in discharge summary   Right chest/neck pain Stable and resolved. Patient noticedsome right chest/neck pain since his admission. Patientpain appears to be managed well with voltaren gel.His chest pain has not bothered him for a few daysand denies chest pain today.We will continue to monitor as X ray has a low sensitivity for osteomyelitis and therefore it cannot be completly ruled out. CT could also be used to detect septic joint. PT recommends outpatient PT, 3x/ week. -Continue to monitor - Flexeril 5mg  q 8hrs PRN for likely muscle pain - May consider CT of shoulder if pain continues  - Voltaren gel 4x daily scheduled - Continue PT for shoulder  -Include PT recommendation of outpatient physical therapy 3 times a week in discharge summary  Right forearm swelling Stable.No swelling noted, likely resolved.Possible thrombus in the right upper extremityhas been ruled out.  -VENOUSVAS US of the upper extremities found no evidence of DVT in the UE, no evidence of superficial vein thrombosis, no evidence of thrombosis in the subclavian. - Will continue to  monitor -ContinueEliquis   Leftbicep pain Likely resolved. Patienthas previouslyreportedthat he began having pain in hismedial,left bicep after procedurebut denies experiencing any pain currently. The painat the time wasconstant, but is worse with movement. The pain follows a cord-like pattern. The patient also reports that he feels more short of breath after his surgery.But denies both dyspnea and left biceps pain. -Performed venous US of L upper extremitywhich demonstrated no evidence of DVT or superficial vein thrombosis in the left upper extremity.  Type 2 diabetes Most recentHgb A1c11.5 on 06/09/2020.Most recent CBG 148 (7/15). -ContinueLantus -Continue to monitor blood glucose levels and adjust as needed -SSI moderate -At bedtime coverage -Continue gabapentin for diabetic neuropathy -Ordered prostheticsfor bilateral lower extremityper PT recs -Consider encouraging patient to follow-up with PCP regarding proper diabetes management within the outpatient setting  Hypertension Patient'sBPtoday122/76. He denies chest painanddyspnea.Systolic blood pressures havepreviouslyranged between115 and141. -Continue amlodipine 5mg and hydralazine 100 mgtid daily -Continue to monitor vitals including BP  Atrial flutter s/p ablation Patient's home medication include amiodarone and apixaban -Continueamiodarone -ContinueEliquis -Continuous cardiac monitoring  Hyperlipidemia -Atorvastatin 40 mg -Consider discussion with patient on importance of relative measures by incorporating appropriate lifestyle modifications including healthy diet and adequate physical activity to work in conjunction with current pharmacologic therapy.  Bipolar disorder Patient currently denies any symptoms including racing thoughts, mood is appropriate. -Abilify 5 mg daily  CHF -Continue to monitor fluid status -Dialysis TTS -Continue to monitor I/Os   GERD -Patient  denies any reflux and epigastric pain at this time. -ContinueProtonix  Polysubstance abuse We will ensure that he is accompaniedwhenhe is allotted time outside. Nurse found patient ingesting "chalk." Bag was still in the room, most of the bag was in [place], but it was determined that the chalk was actually clay, which the patient confirmed. Patient reportedthat this is a cultural remedy for ingesting Vitamins. Patient was under the impression that the chalk was from [facility] where his family is from and had previously  participated in the practice of ingesting chalk for nutrients. The bag has been removed from the patient's room and placed with his possessions.  - Continue to monitor patient  Hx ofHomelessness Per chart review patient has history of homelessness. Patient reports that heisnot homeless at this time and that he is moving back to [facility] soon. Patient does acknowledge that he missed his dialysis appointment because his ride to the dialysis appointment fell through. -Social work consulted for confirmation that patient has stable living situation  Buttocks wound Patient denies any complaints at this time. Patient denies any associated pain. -We will continue to monitor for any changes insymptoms   FEN/GI: Renal carb modified diet PPx: Eliquis   Disposition: Inpatient   Subjective:  Overnight events include patient complaining of 10/10 scrotal pain and received oxycodone IR 5 mg. When I arrive, patient is comfortably sleeping. He still complains of a 10/10 pain and insists that he wants something IV route for pain. Oxycodone given on multiple occasions has failed to provide relief according to patient. He admits today that he has intermittent numbness in his left hand. Patient also complains of ongoing left hip pain that radiates down to his knee. I discussed his imaging results with him, during our conversation, patient fell asleep multiple times and had to be awoken.  Along with a chaperone, I noticed scrotal swelling still present and performed a hip exam which elicited pain.   Objective: Temp:  [98.3 F (36.8 C)-99 F (37.2 C)] 98.4 F (36.9 C) (07/16 0915) Pulse Rate:  [76-97] 76 (07/16 0915) Resp:  [16-18] 18 (07/16 0915) BP: (97-147)/(59-87) 122/76 (07/16 0915) SpO2:  [92 %-100 %] 100 % (07/16 0915) Weight:  [148.8 kg] 148.8 kg (07/15 1213) Physical Exam: General: Patient laying comfortably in bed, in no acute distress. Cardiovascular: regular rate and rhythm, no murmurs or gallops noted  Respiratory: lungs clear to auscultation bilaterally  Abdomen: firm, distended, nontender on palpation, active bowel sounds present Extremities: knee amputation from below the knee bilaterally, FADIR and FABER tests elicited pain GU: scrotal edema noted but improving, and area erythematous  Derm: skin warm and dry to touch  Laboratory: Recent Labs  Lab 06/24/20 0434 06/26/20 1529 06/27/20 0458  WBC 10.1 10.6* 10.1  HGB 7.3* 7.5* 7.0*  HCT 23.5* 24.4* 22.9*  PLT 207 228 212   Recent Labs  Lab 06/24/20 0434 06/26/20 1529 06/27/20 0458  NA 132* 133* 132*  K 4.9 4.4 4.8  CL 95* 93* 94*  CO2 21* 25 20*  BUN 65* 55* 66*  CREATININE 8.06* 7.04* 7.63*  CALCIUM 8.9 8.9 8.9  GLUCOSE 93 153* 184*      Imaging/Diagnostic Tests: CT PELVIS W CONTRAST  Result Date: 06/27/2020 CLINICAL DATA:  Pain and swelling of the scrotum. EXAM: CT PELVIS WITH CONTRAST TECHNIQUE: Multidetector CT imaging of the pelvis was performed using the standard protocol following the bolus administration of intravenous contrast. CONTRAST:  190mL OMNIPAQUE IOHEXOL 300 MG/ML  SOLN COMPARISON:  CT dated Apr 26, 2020 FINDINGS: Urinary Tract: The bladder is decompressed which limits evaluation. There appears to be some mild bladder wall thickening. Bowel: There is scattered sigmoid diverticula without CT evidence for diverticulitis. There is a large amount of stool involving the  visualized portions of the colon. The visualized small bowel is unremarkable. Vascular/Lymphatic: Advanced vascular calcifications are noted. There are enlarged inguinal and pelvic lymph nodes. Many of these were visualized on the patient's CT from May 2021 an appear relatively similar in  size. Reproductive: There is diffuse nonspecific scrotal wall edema. The visualized testicles are unremarkable by CT standards. There is no gas. No abscess. Other: There is a large volume of ascites in the partially visualized abdomen. There is diffuse anasarca. Musculoskeletal: There is no acute displaced fracture. There is early avascular necrosis of the left femoral head. IMPRESSION: 1. Diffuse nonspecific scrotal wall edema. No gas. No abscess. 2. Large volume of ascites in the partially visualized abdomen. 3. Diffuse anasarca. 4. Enlarged inguinal and pelvic lymph nodes. Many of these were visualized on the patient's CT from May 2021 an appear relatively similar in size. In the absence of any known malignancy, these are favored to be reactive in etiology. 5. Early avascular necrosis of the left femoral head. Aortic Atherosclerosis (ICD10-I70.0). Electronically Signed   By: Constance Holster M.D.   On: 06/27/2020 23:11    Donney Dice, DO 06/28/2020, 9:35 AM PGY-1, [place] Intern pager: [phone], text pages welcome

## 2020-06-28 NOTE — Progress Notes (Signed)
Occupational Therapy Treatment Patient Details Name: Lucas [name] MRN: [id number] DOB: 06/13/1971 Today's Date: 06/28/2020    History of present illness Lucas [name] is a 49 y.o. male admitted secondary to enterococcus endocarditis. PMH is significant for atrial flutter s/p ablation, T2DM, ESRD, hyperlipidemia, anemia, CHF, GERD, polysubstance abuse.   OT comments  Pt seen for OT follow up session with focus on scrotal swelling management and BUE Theraband program. Pt reports when his bed was changed yesterday his scrotal sling was thrown away. OT then fabricated sling using bed sheets and pillow cases for comfort and support to reduce swelling. Educated pt on how to place while in bed for comfort. Issued pt BUE theraband program as described below. He reports some R shoulder pain with OH movements. D/c recs appropriate, will continue to follow.   Follow Up Recommendations  Outpatient OT    Equipment Recommendations  3 in 1 bedside commode (drop arm)    Recommendations for Other Services      Precautions / Restrictions Precautions Precautions: Fall Precaution Comments: prior bilateral BKA's Restrictions Weight Bearing Restrictions: No       Mobility Bed Mobility Overal bed mobility: Modified Independent             General bed mobility comments: pt able to move around in bed and reposition himself without difficulties; limited secondary to testicular pain 2/2 edema  Transfers                 General transfer comment: pt declining secondary to pain    Balance                                           ADL either performed or assessed with clinical judgement   ADL Overall ADL's : Needs assistance/impaired                     Lower Body Dressing: Moderate assistance;Bed level Lower Body Dressing Details (indicate cue type and reason): to assist in donning scrotal sling               General ADL Comments: session focused on  scrotal sling management and BUE theraband program     Vision Baseline Vision/History: Wears glasses Wears Glasses: Reading only Patient Visual Report: No change from baseline Additional Comments: OT issued pt reading glasses, is now able to read his small print   Perception     Praxis      Cognition Arousal/Alertness: Awake/alert Behavior During Therapy: WFL for tasks assessed/performed Overall Cognitive Status: No family/caregiver present to determine baseline cognitive functioning Area of Impairment: Safety/judgement                         Safety/Judgement: Decreased awareness of safety     General Comments: needs safety /judgement cues. Is aware he is NPO for procedure, but needs it explained several times. Safety cues to manage scrotal swelling        Exercises Exercises: Other exercises Shoulder Exercises Shoulder Flexion: AROM;15 reps;Theraband Theraband Level (Shoulder Flexion): Level 2 (Red) Shoulder ABduction: AROM;Theraband;15 reps Elbow Flexion: AROM;15 reps;Theraband Theraband Level (Elbow Flexion): Level 2 (Red) Other Exercises Other Exercises: pt performed resisted elbow flexion/extension exercises bilaterally x20 Other Exercises: pt performed bilateral shoulder flexion x10 Other Exercises: pt performed bilateral shoulder abduction/adduction with scapular retraction x10   Shoulder Instructions  General Comments      Pertinent Vitals/ Pain       Pain Assessment: Faces Faces Pain Scale: Hurts little more Pain Location: groin Pain Descriptors / Indicators: Grimacing;Guarding Pain Intervention(s): Monitored during session;Other (comment) (elevated with scrotal sling)  Home Living                                          Prior Functioning/Environment              Frequency  Min 2X/week        Progress Toward Goals  OT Goals(current goals can now be found in the care plan section)  Progress towards OT  goals: Progressing toward goals  Acute Rehab OT Goals Patient Stated Goal: to get prothestics for both legs OT Goal Formulation: With patient Time For Goal Achievement: 07/02/20 Potential to Achieve Goals: Good  Plan Discharge plan remains appropriate    Co-evaluation                 AM-PAC OT "6 Clicks" Daily Activity     Outcome Measure   Help from another person eating meals?: None Help from another person taking care of personal grooming?: A Little Help from another person toileting, which includes using toliet, bedpan, or urinal?: A Lot Help from another person bathing (including washing, rinsing, drying)?: A Little Help from another person to put on and taking off regular upper body clothing?: A Little Help from another person to put on and taking off regular lower body clothing?: A Little 6 Click Score: 18    End of Session    OT Visit Diagnosis: Other abnormalities of gait and mobility (R26.89);Muscle weakness (generalized) (M62.81);Other symptoms and signs involving cognitive function;Other symptoms and signs involving the nervous system (R29.898);Pain Pain - Right/Left: Right Pain - part of body: Shoulder   Activity Tolerance Patient tolerated treatment well   Patient Left in bed;with call bell/phone within reach   Nurse Communication Mobility status        Time: 9675-9163 OT Time Calculation (min): 26 min  Charges: OT General Charges $OT Visit: 1 Visit OT Treatments $Self Care/Home Management : 23-37 mins  Zenovia Jarred, MSOT, OTR/L [facility] Office Number: [phone] Pager: [phone]  Zenovia Jarred 06/28/2020, 2:05 PM

## 2020-06-28 NOTE — Progress Notes (Signed)
Patient ID: Lucas [name], male   DOB: [birth year], 50 y.o.   MRN: [id number] S:   Sig scrotal swelling, had CT pelvis w/o obvious infection; sig ascites present  Req extra HD  HD yest 5L UF  O:BP 122/76 (BP Location: Left Arm)    Pulse 76    Temp 98.4 F (36.9 C)    Resp 18    Ht 6\' 4"  (1.93 m)    Wt (!) 148.8 kg    SpO2 100%    BMI 39.93 kg/m   Intake/Output Summary (Last 24 hours) at 06/28/2020 1151 Last data filed at 06/28/2020 0900 Gross per 24 hour  Intake 480 ml  Output 5000 ml  Net -4520 ml   Intake/Output: I/O last 3 completed shifts: In: 720 [P.O.:720] Out: 5000 [Other:5000]    Intake/Output this shift:  Total I/O In: 120 [P.O.:120] Out: -  Weight change: -1.361 kg Gen: NAD, periorbital edema noted CVS: no rub Resp: cta Abd: distended, + wall edema Ext: s/p bilateral BKAs with 1+ edema  Scrotum is grapefruit sized  Recent Labs  Lab 06/22/20 1257 06/24/20 0434 06/26/20 1529 06/27/20 0458 06/28/20 1025  NA 131* 132* 133* 132* 135  K 5.0 4.9 4.4 4.8 4.2  CL 94* 95* 93* 94* 98  CO2 23 21* 25 20* 25  GLUCOSE 180* 93 153* 184* 138*  BUN 66* 65* 55* 66* 45*  CREATININE 8.89* 8.06* 7.04* 7.63* 6.34*  ALBUMIN 2.5*  --   --   --   --   CALCIUM 9.0 8.9 8.9 8.9 8.5*  PHOS 5.3*  --   --   --   --    Liver Function Tests: Recent Labs  Lab 06/22/20 1257  ALBUMIN 2.5*   No results for input(s): LIPASE, AMYLASE in the last 168 hours. No results for input(s): AMMONIA in the last 168 hours. CBC: Recent Labs  Lab 06/22/20 0756 06/22/20 1634 06/24/20 0434 06/24/20 0434 06/26/20 1529 06/27/20 0458 06/28/20 1025  WBC 11.3*  --  10.1   < > 10.6* 10.1 8.3  NEUTROABS 9.6*  --  8.2*  --   --   --   --   HGB 7.0*   < > 7.3*   < > 7.5* 7.0* 7.0*  HCT 23.1*   < > 23.5*   < > 24.4* 22.9* 23.5*  MCV 93.9  --  93.6  --  93.8 93.1 94.8  PLT 240  --  207   < > 228 212 197   < > = values in this interval not displayed.   Cardiac Enzymes: Recent Labs  Lab  06/24/20 0434  CKTOTAL 169   CBG: Recent Labs  Lab 06/27/20 1259 06/27/20 1645 06/27/20 2119 06/28/20 0648 06/28/20 1128  GLUCAP 118* 156* 148* 152* 117*    Iron Studies:  No results for input(s): IRON, TIBC, TRANSFERRIN, FERRITIN in the last 72 hours. Studies/Results: CT PELVIS W CONTRAST  Result Date: 06/27/2020 CLINICAL DATA:  Pain and swelling of the scrotum. EXAM: CT PELVIS WITH CONTRAST TECHNIQUE: Multidetector CT imaging of the pelvis was performed using the standard protocol following the bolus administration of intravenous contrast. CONTRAST:  127mL OMNIPAQUE IOHEXOL 300 MG/ML  SOLN COMPARISON:  CT dated Apr 26, 2020 FINDINGS: Urinary Tract: The bladder is decompressed which limits evaluation. There appears to be some mild bladder wall thickening. Bowel: There is scattered sigmoid diverticula without CT evidence for diverticulitis. There is a large amount of stool involving the visualized portions of the colon.  The visualized small bowel is unremarkable. Vascular/Lymphatic: Advanced vascular calcifications are noted. There are enlarged inguinal and pelvic lymph nodes. Many of these were visualized on the patient's CT from May 2021 an appear relatively similar in size. Reproductive: There is diffuse nonspecific scrotal wall edema. The visualized testicles are unremarkable by CT standards. There is no gas. No abscess. Other: There is a large volume of ascites in the partially visualized abdomen. There is diffuse anasarca. Musculoskeletal: There is no acute displaced fracture. There is early avascular necrosis of the left femoral head. IMPRESSION: 1. Diffuse nonspecific scrotal wall edema. No gas. No abscess. 2. Large volume of ascites in the partially visualized abdomen. 3. Diffuse anasarca. 4. Enlarged inguinal and pelvic lymph nodes. Many of these were visualized on the patient's CT from May 2021 an appear relatively similar in size. In the absence of any known malignancy, these are  favored to be reactive in etiology. 5. Early avascular necrosis of the left femoral head. Aortic Atherosclerosis (ICD10-I70.0). Electronically Signed   By: Constance Holster M.D.   On: 06/27/2020 23:11    sodium chloride   Intravenous Once   acetaminophen  1,000 mg Oral Q6H WA   amiodarone  200 mg Oral Daily   amLODipine  5 mg Oral Daily   apixaban  5 mg Oral BID   ARIPiprazole  5 mg Oral Daily   atorvastatin  40 mg Oral Daily   carvedilol  12.5 mg Oral BID   Chlorhexidine Gluconate Cloth  6 each Topical Q0600   Chlorhexidine Gluconate Cloth  6 each Topical Q0600   darbepoetin (ARANESP) injection - DIALYSIS  150 mcg Intravenous Q Sat-HD   diclofenac Sodium  4 g Topical QID   doxercalciferol  4 mcg Oral Q T,Th,Sa-HD   gabapentin  300 mg Oral QHS   hydrALAZINE  100 mg Oral TID   insulin aspart  0-15 Units Subcutaneous TID WC   insulin aspart  0-5 Units Subcutaneous QHS   insulin glargine  20 Units Subcutaneous Daily   lidocaine  1 patch Transdermal Q24H   linezolid  600 mg Oral Q12H   melatonin  5 mg Oral QHS   nystatin   Topical BID   nystatin cream   Topical BID   pantoprazole  40 mg Oral Daily   polyethylene glycol  17 g Oral Daily   QUEtiapine  100 mg Oral QHS   sevelamer carbonate  1,600 mg Oral TID with meals   sodium chloride flush  10-40 mL Intracatheter Q12H   tamsulosin  0.4 mg Oral Daily    BMET    Component Value Date/Time   NA 135 06/28/2020 1025   K 4.2 06/28/2020 1025   CL 98 06/28/2020 1025   CO2 25 06/28/2020 1025   GLUCOSE 138 (H) 06/28/2020 1025   BUN 45 (H) 06/28/2020 1025   CREATININE 6.34 (H) 06/28/2020 1025   CALCIUM 8.5 (L) 06/28/2020 1025   GFRNONAA 10 (L) 06/28/2020 1025   GFRAA 11 (L) 06/28/2020 1025   CBC    Component Value Date/Time   WBC 8.3 06/28/2020 1025   RBC 2.48 (L) 06/28/2020 1025   HGB 7.0 (L) 06/28/2020 1025   HCT 23.5 (L) 06/28/2020 1025   PLT 197 06/28/2020 1025   MCV 94.8 06/28/2020 1025    MCH 28.2 06/28/2020 1025   MCHC 29.8 (L) 06/28/2020 1025   RDW 17.9 (H) 06/28/2020 1025   LYMPHSABS 0.6 (L) 06/24/2020 0434   MONOABS 1.0 06/24/2020 0434   EOSABS  0.1 06/24/2020 0434   BASOSABS 0.0 06/24/2020 0434   [facility] [place] [facility]) Phone [phone] Scheduled TTS 4 hours His outpatient unit reports noncompliance BF 400; DF 800 F250 dialyzer  2/2.5 ca bath EDW 250 lbs Last weight 272.2 lbs on 6/24 (signed off early and had skipped the treatment prior) aranesp 120 mcg weekly  hectoral 4 mcg each tx  Assessment/Plan:  1. Enterococcus bacteremia/Mitral valve Endocarditis- due to tunneled HD catheter. TDC removed 6/29 s/p temp HD catheter placed 06/14/20.Now with new [facility] [place] placed 7/6. Will continue withIVcubicin andoralzyvoxper ID 1. Not candidate for surgery due to multiple co-morbidities, social issues, and polysubstance abuse 2. Noted vegetation on TTE 6/29 3. daptomycin through 07/22/20 and oral linezolid for 4 weeks (his home HD unit does have daptomycin) 4. Follow up with ID on 07/08/20 2. ESRDcontinue with TTS schedule; catheter replaced by IR on 06/18/20. 1. TTS schedule here - HD today UF 5.5L.  AGAIN reiterated fluid restriction.  Will attempt sequential tx today or extra UF if schedule allows.  D/w patient and he's agreeable: 3h, 3-4L UF.  Usual HD tomorrow: 4h, 5L UF, 2K. Tight heparin. TDC.  3. Mitral valve endocarditis- noted on TTE 6/29. ID following. Daptomycin for 6 weeks through 07/22/20, oral linezolid for 4 weeks, follow up with ID on 07/08/20. 4. Anemia:due to ESRD. Continue with ESA. Check Hb 7s CTM  5. CKD-MBD:continue with home meds 6. Nutrition:renal diet 7. Hypertension:BPs srtable, CTM 8. Vascular access-s/ptunneled HD catheter per IR7/6/21. 9. Disposition- SW to assist with transportation to his outpatient unit.Stable for discharge from renal standpoint, however there appears to be issues with disposition.    [facility]

## 2020-06-28 NOTE — Progress Notes (Signed)
Physical Therapy Treatment Patient Details Name: Cyprian Gongaware MRN: [id number] DOB: 1971-07-04 Today's Date: 06/28/2020    History of Present Illness Mays Paino is a 49 y.o. male admitted secondary to enterococcus endocarditis. PMH is significant for atrial flutter s/p ablation, T2DM, ESRD, hyperlipidemia, anemia, CHF, GERD, polysubstance abuse.    PT Comments    Pt initially declining participation in PT session but later agreeable to bed mobility and bilateral UE strengthening therex. He remains very limited overall due to his testicular pain from edema. Pt would continue to benefit from skilled physical therapy services at this time while admitted and after d/c to address the below listed limitations in order to improve overall safety and independence with functional mobility.    Follow Up Recommendations  Outpatient PT     Equipment Recommendations  3in1 (PT)    Recommendations for Other Services       Precautions / Restrictions Precautions Precautions: Fall Precaution Comments: prior bilateral BKA's Restrictions Weight Bearing Restrictions: No    Mobility  Bed Mobility Overal bed mobility: Modified Independent             General bed mobility comments: pt able to move around in bed and reposition himself without difficulties; limited secondary to testicular pain 2/2 edema  Transfers                 General transfer comment: pt declining secondary to pain  Ambulation/Gait                 Stairs             Wheelchair Mobility    Modified Rankin (Stroke Patients Only)       Balance                                            Cognition Arousal/Alertness: Awake/alert Behavior During Therapy: WFL for tasks assessed/performed Overall Cognitive Status: No family/caregiver present to determine baseline cognitive functioning Area of Impairment: Safety/judgement                         Safety/Judgement:  Decreased awareness of safety            Exercises Other Exercises Other Exercises: pt performed resisted elbow flexion/extension exercises bilaterally x20 Other Exercises: pt performed bilateral shoulder flexion x10 Other Exercises: pt performed bilateral shoulder abduction/adduction with scapular retraction x10    General Comments        Pertinent Vitals/Pain Pain Assessment: Faces Faces Pain Scale: Hurts even more Pain Location: groin Pain Descriptors / Indicators: Grimacing;Guarding Pain Intervention(s): Monitored during session    Home Living                      Prior Function            PT Goals (current goals can now be found in the care plan section) Acute Rehab PT Goals PT Goal Formulation: With patient Time For Goal Achievement: 07/01/20 Potential to Achieve Goals: Good Progress towards PT goals: Progressing toward goals    Frequency    Min 3X/week      PT Plan Current plan remains appropriate    Co-evaluation              AM-PAC PT "6 Clicks" Mobility   Outcome Measure  Help needed turning from your back to  your side while in a flat bed without using bedrails?: None Help needed moving from lying on your back to sitting on the side of a flat bed without using bedrails?: None Help needed moving to and from a bed to a chair (including a wheelchair)?: A Little Help needed standing up from a chair using your arms (e.g., wheelchair or bedside chair)?: Total Help needed to walk in hospital room?: Total Help needed climbing 3-5 steps with a railing? : Total 6 Click Score: 14    End of Session   Activity Tolerance: Patient limited by pain Patient left: in bed;with call bell/phone within reach Nurse Communication: Mobility status PT Visit Diagnosis: Other abnormalities of gait and mobility (R26.89);Muscle weakness (generalized) (M62.81);Pain Pain - part of body:  (groin)     Time: 1030-1049 PT Time Calculation (min) (ACUTE ONLY):  19 min  Charges:  $Therapeutic Exercise: 8-22 mins                     Anastasio Champion, DPT  Acute Rehabilitation Services Pager [phone] Office [place] 06/28/2020, 11:29 AM

## 2020-06-28 NOTE — Progress Notes (Signed)
PT has made several request for oxycodone PRN medicine for the pain in his scrotum. This RN called MD due to the only medicine for pain was tylenol. MD advised to give patient his scheduled tylenol, Nystatin powder, Nystatin cream and Lidoderm patch to assist with his pain. RN went into patient's room to administer medication, advised by the MD. Patient became upset and stating that tylenol wont do anything and that he would not be taking it, because it didn't help at all. Pt also refusing Voltaren cream stating that it also didn't help. After multiple tries he finally allowed me to administer the Nystatin cream and powder. I Called MD again and informed them of the situation. Oxy was order for patient's pain and given.

## 2020-06-28 NOTE — Progress Notes (Signed)
Ultrasound tech called regarding patient's orders for ABD ultrasound orders, this RN informed the tech that patient had ate breakfast; she then stated that it was okay, and to make patient NPO for lunch. I informed Lucas [name] that he would be NPO and not to drink or eat anything for his ultrasound; pt stated he understood. Upon rounding on patient I had noticed that pt's family brought him food and he had in fact ate it all. I called ultrasound tech and she stated that they would just attempt the test in the morning, and leave him Npo at midnight. MD was informed of the events.

## 2020-06-28 NOTE — Plan of Care (Signed)
  Problem: Nutrition: Goal: Adequate nutrition will be maintained Outcome: Progressing   

## 2020-06-28 NOTE — Progress Notes (Signed)
Pt refuses to be complaint with his wound dressings pulling them off or wanting to double the dressings on the same site. This RN noticed patient digging/scratching  in his R stump, buttocks area and scrotum  were some drainage was visible, I educated on the risk of infection, but patient did not welcome education. I changed patient to an air mattress to assist with wound healing, due to the patient not following instructions on wound care and not following hygiene recommendations. Will continue to educate

## 2020-06-29 ENCOUNTER — Inpatient Hospital Stay (HOSPITAL_COMMUNITY): Payer: Medicaid Other

## 2020-06-29 LAB — BASIC METABOLIC PANEL
Anion gap: 14 (ref 5–15)
BUN: 24 mg/dL — ABNORMAL HIGH (ref 6–20)
CO2: 25 mmol/L (ref 22–32)
Calcium: 8.6 mg/dL — ABNORMAL LOW (ref 8.9–10.3)
Chloride: 98 mmol/L (ref 98–111)
Creatinine, Ser: 4.38 mg/dL — ABNORMAL HIGH (ref 0.61–1.24)
GFR calc Af Amer: 17 mL/min — ABNORMAL LOW (ref >60)
GFR calc non Af Amer: 15 mL/min — ABNORMAL LOW (ref >60)
Glucose, Bld: 250 mg/dL — ABNORMAL HIGH (ref 70–99)
Potassium: 3.5 mmol/L (ref 3.5–5.1)
Sodium: 137 mmol/L (ref 135–145)

## 2020-06-29 LAB — CBC
HCT: 22.8 % — ABNORMAL LOW (ref 39.0–52.0)
Hemoglobin: 6.8 g/dL — CL (ref 13.0–17.0)
MCH: 28.6 pg (ref 26.0–34.0)
MCHC: 29.8 g/dL — ABNORMAL LOW (ref 30.0–36.0)
MCV: 95.8 fL (ref 80.0–100.0)
Platelets: 224 10*3/uL (ref 150–400)
RBC: 2.38 MIL/uL — ABNORMAL LOW (ref 4.22–5.81)
RDW: 18 % — ABNORMAL HIGH (ref 11.5–15.5)
WBC: 8.3 10*3/uL (ref 4.0–10.5)
nRBC: 0.5 % — ABNORMAL HIGH (ref 0.0–0.2)

## 2020-06-29 LAB — GLUCOSE, CAPILLARY
Glucose-Capillary: 293 mg/dL — ABNORMAL HIGH (ref 70–99)
Glucose-Capillary: 193 mg/dL — ABNORMAL HIGH (ref 70–99)
Glucose-Capillary: 154 mg/dL — ABNORMAL HIGH (ref 70–99)
Glucose-Capillary: 183 mg/dL — ABNORMAL HIGH (ref 70–99)

## 2020-06-29 LAB — GAMMA GT: GGT: 425 U/L — ABNORMAL HIGH (ref 7–50)

## 2020-06-29 MED ORDER — DOXERCALCIFEROL 4 MCG/2ML IV SOLN
INTRAVENOUS | Status: AC
  Administered 2020-06-29: 4 ug
  Filled 2020-06-29: qty 2

## 2020-06-29 MED ORDER — DARBEPOETIN ALFA 150 MCG/0.3ML IJ SOSY
PREFILLED_SYRINGE | INTRAMUSCULAR | Status: AC
  Filled 2020-06-29: qty 0.3

## 2020-06-29 MED ORDER — HEPARIN SODIUM (PORCINE) 1000 UNIT/ML IJ SOLN
INTRAMUSCULAR | Status: AC
  Administered 2020-06-29: 3800 [IU] via INTRAVENOUS_CENTRAL
  Filled 2020-06-29: qty 4

## 2020-06-29 NOTE — Progress Notes (Signed)
Patient ID: Joniel Graumann, male   DOB: [birth year], 49 y.o.   MRN: [id number] S:   Extra tx yest: 3L UF  No c/o this AM  O:BP (!) 132/91 (BP Location: Left Arm)   Pulse 97   Temp 97.7 F (36.5 C) (Oral)   Resp 18   Ht 6\' 4"  (1.93 m)   Wt 135 kg   SpO2 100%   BMI 36.23 kg/m   Intake/Output Summary (Last 24 hours) at 06/29/2020 1000 Last data filed at 06/29/2020 0900 Gross per 24 hour  Intake 796 ml  Output 3000 ml  Net -2204 ml   Intake/Output: I/O last 3 completed shifts: In: 796 [P.O.:680; IV Piggyback:116] Out: 3000 [Other:3000]    Intake/Output this shift:  Total I/O In: 240 [P.O.:240] Out: -  Weight change: -15.8 kg Gen: NAD, CVS: no rub Resp: cta Abd: distended, + wall edema Ext: s/p bilateral BKAs with 1+ edema  Scrotum is grapefruit sized R IJ TDC bandaged  Recent Labs  Lab 06/22/20 1257 06/24/20 0434 06/26/20 1529 06/27/20 0458 06/28/20 1025  NA 131* 132* 133* 132* 135  K 5.0 4.9 4.4 4.8 4.2  CL 94* 95* 93* 94* 98  CO2 23 21* 25 20* 25  GLUCOSE 180* 93 153* 184* 138*  BUN 66* 65* 55* 66* 45*  CREATININE 8.89* 8.06* 7.04* 7.63* 6.34*  ALBUMIN 2.5*  --   --   --  2.4*  CALCIUM 9.0 8.9 8.9 8.9 8.5*  PHOS 5.3*  --   --   --   --   AST  --   --   --   --  22  ALT  --   --   --   --  8   Liver Function Tests: Recent Labs  Lab 06/22/20 1257 06/28/20 1025  AST  --  22  ALT  --  8  ALKPHOS  --  308*  BILITOT  --  0.5  PROT  --  7.7  ALBUMIN 2.5* 2.4*   No results for input(s): LIPASE, AMYLASE in the last 168 hours. No results for input(s): AMMONIA in the last 168 hours. CBC: Recent Labs  Lab 06/24/20 0434 06/24/20 0434 06/26/20 1529 06/27/20 0458 06/28/20 1025  WBC 10.1   < > 10.6* 10.1 8.3  NEUTROABS 8.2*  --   --   --   --   HGB 7.3*   < > 7.5* 7.0* 7.0*  HCT 23.5*   < > 24.4* 22.9* 23.5*  MCV 93.6  --  93.8 93.1 94.8  PLT 207   < > 228 212 197   < > = values in this interval not displayed.   Cardiac Enzymes: Recent Labs  Lab  06/24/20 0434  CKTOTAL 169   CBG: Recent Labs  Lab 06/27/20 2119 06/28/20 0648 06/28/20 1128 06/28/20 2138 06/29/20 0713  GLUCAP 148* 152* 117* 176* 293*    Iron Studies:  No results for input(s): IRON, TIBC, TRANSFERRIN, FERRITIN in the last 72 hours. Studies/Results: CT PELVIS W CONTRAST  Result Date: 06/27/2020 CLINICAL DATA:  Pain and swelling of the scrotum. EXAM: CT PELVIS WITH CONTRAST TECHNIQUE: Multidetector CT imaging of the pelvis was performed using the standard protocol following the bolus administration of intravenous contrast. CONTRAST:  182mL OMNIPAQUE IOHEXOL 300 MG/ML  SOLN COMPARISON:  CT dated Apr 26, 2020 FINDINGS: Urinary Tract: The bladder is decompressed which limits evaluation. There appears to be some mild bladder wall thickening. Bowel: There is scattered sigmoid diverticula without CT  evidence for diverticulitis. There is a large amount of stool involving the visualized portions of the colon. The visualized small bowel is unremarkable. Vascular/Lymphatic: Advanced vascular calcifications are noted. There are enlarged inguinal and pelvic lymph nodes. Many of these were visualized on the patient's CT from May 2021 an appear relatively similar in size. Reproductive: There is diffuse nonspecific scrotal wall edema. The visualized testicles are unremarkable by CT standards. There is no gas. No abscess. Other: There is a large volume of ascites in the partially visualized abdomen. There is diffuse anasarca. Musculoskeletal: There is no acute displaced fracture. There is early avascular necrosis of the left femoral head. IMPRESSION: 1. Diffuse nonspecific scrotal wall edema. No gas. No abscess. 2. Large volume of ascites in the partially visualized abdomen. 3. Diffuse anasarca. 4. Enlarged inguinal and pelvic lymph nodes. Many of these were visualized on the patient's CT from May 2021 an appear relatively similar in size. In the absence of any known malignancy, these are  favored to be reactive in etiology. 5. Early avascular necrosis of the left femoral head. Aortic Atherosclerosis (ICD10-I70.0). Electronically Signed   By: Constance Holster M.D.   On: 06/27/2020 23:11   . sodium chloride   Intravenous Once  . acetaminophen  1,000 mg Oral Q6H WA  . amiodarone  200 mg Oral Daily  . amLODipine  5 mg Oral Daily  . apixaban  5 mg Oral BID  . ARIPiprazole  5 mg Oral Daily  . atorvastatin  40 mg Oral Daily  . carvedilol  12.5 mg Oral BID  . Chlorhexidine Gluconate Cloth  6 each Topical Q0600  . Chlorhexidine Gluconate Cloth  6 each Topical Q0600  . darbepoetin (ARANESP) injection - DIALYSIS  150 mcg Intravenous Q Sat-HD  . diclofenac Sodium  4 g Topical QID  . doxercalciferol  4 mcg Oral Q T,Th,Sa-HD  . gabapentin  300 mg Oral QHS  . hydrALAZINE  100 mg Oral TID  . insulin aspart  0-15 Units Subcutaneous TID WC  . insulin aspart  0-5 Units Subcutaneous QHS  . insulin glargine  20 Units Subcutaneous Daily  . lidocaine  1 patch Transdermal Q24H  . linezolid  600 mg Oral Q12H  . melatonin  5 mg Oral QHS  . nystatin   Topical BID  . nystatin cream   Topical BID  . pantoprazole  40 mg Oral Daily  . polyethylene glycol  17 g Oral Daily  . QUEtiapine  100 mg Oral QHS  . sevelamer carbonate  1,600 mg Oral TID with meals  . sodium chloride flush  10-40 mL Intracatheter Q12H  . tamsulosin  0.4 mg Oral Daily    BMET    Component Value Date/Time   NA 135 06/28/2020 1025   K 4.2 06/28/2020 1025   CL 98 06/28/2020 1025   CO2 25 06/28/2020 1025   GLUCOSE 138 (H) 06/28/2020 1025   BUN 45 (H) 06/28/2020 1025   CREATININE 6.34 (H) 06/28/2020 1025   CALCIUM 8.5 (L) 06/28/2020 1025   GFRNONAA 10 (L) 06/28/2020 1025   GFRAA 11 (L) 06/28/2020 1025   CBC    Component Value Date/Time   WBC 8.3 06/28/2020 1025   RBC 2.48 (L) 06/28/2020 1025   HGB 7.0 (L) 06/28/2020 1025   HCT 23.5 (L) 06/28/2020 1025   PLT 197 06/28/2020 1025   MCV 94.8 06/28/2020 1025    MCH 28.2 06/28/2020 1025   MCHC 29.8 (L) 06/28/2020 1025   RDW 17.9 (H) 06/28/2020  1025   LYMPHSABS 0.6 (L) 06/24/2020 0434   MONOABS 1.0 06/24/2020 0434   EOSABS 0.1 06/24/2020 0434   BASOSABS 0.0 06/24/2020 0434   [facility] [place] [facility]) Phone [phone] Scheduled TTS 4 hours His outpatient unit reports noncompliance BF 400; DF 800 F250 dialyzer  2/2.5 ca bath EDW 250 lbs Last weight 272.2 lbs on 6/24 (signed off early and had skipped the treatment prior) aranesp 120 mcg weekly  hectoral 4 mcg each tx  Assessment/Plan:  1. Enterococcus bacteremia/Mitral valve Endocarditis- due to tunneled HD catheter. TDC removed 6/29 s/p temp HD catheter placed 06/14/20.Now with new LIJ [place] placed 7/6. Will continue withIVcubicin andoralzyvoxper ID 1. Not candidate for surgery due to multiple co-morbidities, social issues, and polysubstance abuse 2. Noted vegetation on TTE 6/29 3. daptomycin through 07/22/20 and oral linezolid for 4 weeks (his home HD unit does have daptomycin) 4. Follow up with ID on 07/08/20 2. ESRDcontinue with TTS schedule; catheter replaced by IR on 06/18/20. 1. TTS schedule here - Usual HD today on schedule.  4h, 5L UF, 2K. Tight heparin. TDC.  2. Sig hypervolemia, had extra Tx 7/16; chronic issue 3. Mitral valve endocarditis- noted on TTE 6/29. ID following. Daptomycin for 6 weeks through 07/22/20, oral linezolid for 4 weeks, follow up with ID on 07/08/20. 4. Anemia:due to ESRD. Continue with ESA. Hb 7s CTM.   5. CKD-MBD:continue with home meds 6. Nutrition:renal diet 7. Hypertension:BPs srtable, CTM 8. Vascular access-s/pL IJ tunneled HD catheter per IR7/6/21. 9. Disposition- SW to assist with transportation to his outpatient unit.Stable for discharge from renal standpoint [facility]

## 2020-06-29 NOTE — Progress Notes (Signed)
CRITICAL VALUE ALERT  Critical Value:  HGB 6.8  Date & Time Notied:  06/29/2020  2055  Provider Notified: FMTS [id number]  Orders Received/Actions taken: MD will discuss with Nephrology in AM due to not a significant drop. HGB was 7 before HD today.

## 2020-06-29 NOTE — Progress Notes (Signed)
Was paged about hgb of 6.8.  Pt's last hgb was 7.0  yesterday And is chronically < 8 so not an acute drop.  Pt received dialysis today. Does not appear transfusion performed at that time. Will hold off on transfusion tonight until we can discuss with nephrology in am if they would like to do transfusion during next dialysis session.

## 2020-06-29 NOTE — Plan of Care (Signed)
  Problem: Pain Managment: Goal: General experience of comfort will improve Outcome: Progressing   Problem: Safety: Goal: Ability to remain free from injury will improve Outcome: Progressing   

## 2020-06-29 NOTE — Progress Notes (Addendum)
Family Medicine Teaching Service Daily Progress Note Intern Pager: [phone]  Patient name: Lucas [name] Medical record number: [id number] Date of birth: June 25, 1971 Age: 49 y.o. Gender: male  Primary Care Provider: System, Pcp Not In Consultants: Nephrology, cardiology, infectious disease Code Status: Full Code  Pt Overview and Major Events to Date:  Admitted for hyperglycemia and port pain on 06/09/2020 Enterococcus grew on blood cultures on 06/10/2020 TTE demonstrated mitral valve vegetation on 06/11/2020 Removal of right IJ HD catheter on 06/11/2020 TEE completed on 06/13/2020 Patient received left IJ nontunneled, restarted HD on 06/14/2020 Patient received long-term tunneled left IJ on 06/19/2020  Antibiotics Patient's current antibiotics and duration listed below. Gentamicin 6/29-6/30 Vancomycin 6/28-6/29 Linezolid 7/1-7/28 Daptomycin 7/1-8/9   Assessment and Plan: Lucas [name] a 49 y.o.malewith Enterococcus endocarditis. PMH is significant foratrial flutter s/p ablation, T2DM, ESRD, hyperlipidemia, anemia, CHF, GERD, polysubstance abuseandhomelessness.   Enterococcus endocarditis Patienthas been compliant with his treatmentthusfar. He recognizes that he may be in the hospital for an extended period.CVTS noted that he is not a good surgical candidate due to his significant comorbidities and current social situation. They advised following up in clinic following completion of his antibiotic course. -Nephrologyfollowing,patient stable for discharge from renal standpoint -Infectious disease sign off, 7/3, will f/u with patient 7/26 -ContinueLinezolidPOday15/28 -Continue IV daptomycin day15/42,consult pharmacy for dosing with HD -weekly labs: CBC w/ diff, BMP, CK -Continue to follow-up on repeat blood cultures from after his line was pulled -Awaiting CBC,patientmost recentHgb7.54from 7/15willnot transfuse at this time.  Patientpreviouslyreportedbleedingafter the procedure prior to IR suturing the area at the bedside.  Scrotal swelling Patient today complaining of 10/10 pain. [place] and CT pelvis of scrotum revealed diffuse scrotal wall edema present. Although patient has a history of ESRD, since he is not having any output, Dr. Johnney Ou (nephrologist) agreed that it was appropriate to get imaging with contrast to rule out fournier gangrene. CT pelvis w/ contrast demonstrated early vascular necrosis of the left femoral head, diffuse nonspecific scrotal wall edema, ascites and enlarged inguinal and pelvic lymph nodes. My suspicion is that patient may be experiencing referred pain to his groin in addition to fluid overload which may be contributing to his scrotal edema. Patient had a RUQ [place] in March 2021 showing cirrhosis and paracentesis in April.  -Continue to ice and elevate -oxy IR 5mg  scheduled -Continue dialysis -If schedule allows, patient may receive extra filtration session per nephro. Patient was already notified and agrees.  -Monitor I/Os, continue fluid restriction  -Hepatic panel given imaging yielding ascites  -nystatin - RUQ [place] pending   Infected dialysis catheter Following a line holiday, and new leftnontunneledIJ catheter was placed on 7/2.New IJ tunneled catheter placed7/7, patient had bleeding after the surgery. IR sutured with dissolvable sutures at the bedside, no f/u needed. -Continue to monitor catheter site -Back on TTS schedule for dialysis -Tramadol twice daily as needed for pain -Flexeril 5mg  q 8hrs PRN for muscle pain -Continue to monitor CBCs  Right shoulder pain Patient right sided shoulder pain most likely resolved, he denies any pain associated with it today.Patient rated his shoulder pain as a 10/107/12 and 1/10 on 7/14.Hepreviouslydescribedit as a dull, achy pain.Itmay be due to his osteoarthritis which was seen on x-ray of right shoulder.Pain is elicitedon  both active andpassive ROM.Oxycodone seemed toprovide adequate pain relief that allowed himto sleep at night and were given multiple times.Voltaren gel was previously used, patient stated it helped until now when the pain has suddenly worsened.Todaypatientmaintainsmore active and passive ROM. Patient did not  have any overnight events relating to his recurrent shoulder pain, the increase in gabapentin may have helped with partial pain relief. -Continuegabapentin300 mg -Voltaren gel, tylenol, flexeril as needed for pain -Continue PT -Continue to monitor -May consider imaging if worsening symptoms -Include PT recommendation of outpatient physical therapy 3 times a week in discharge summary   Right chest/neck pain Stable and resolved. Patient noticedsome right chest/neck pain since his admission. Patientpain appears to be managed well with voltaren gel.His chest pain has not bothered him for a few daysand denies chest pain today.We will continue to monitor as X ray has a low sensitivity for osteomyelitis and therefore it cannot be completly ruled out. CT could also be used to detect septic joint. PT recommends outpatient PT, 3x/ week. -Continue to monitor - Flexeril 5mg  q 8hrs PRN for likely muscle pain - May consider CT of shoulder if pain continues  - Voltaren gel 4x daily scheduled - Continue PT for shoulder  -Include PT recommendation of outpatient physical therapy 3 times a week in discharge summary  Right forearm swelling Stable.No swelling noted, likely resolved.Possible thrombus in the right upper extremityhas been ruled out.  -VENOUSVAS US of the upper extremities found no evidence of DVT in the UE, no evidence of superficial vein thrombosis, no evidence of thrombosis in the subclavian. - Will continue to monitor -ContinueEliquis   Leftbicep pain Likely resolved. Patienthas previouslyreportedthat he began having pain in hismedial,left bicep  after procedurebut denies experiencing any pain currently. The painat the time wasconstant, but is worse with movement. The pain follows a cord-like pattern. The patient also reports that he feels more short of breath after his surgery.But denies both dyspnea and left biceps pain. -Performed venous US of L upper extremitywhich demonstrated no evidence of DVT or superficial vein thrombosis in the left upper extremity.  Type 2 diabetes Most recentHgb A1c11.5 on 06/09/2020.Most recent CBG 176 (7/15). -ContinueLantus -Continue to monitor blood glucose levels and adjust as needed -SSI moderate -At bedtime coverage -Continue gabapentin for diabetic neuropathy -Ordered prostheticsfor bilateral lower extremityper PT recs -Consider encouraging patient to follow-up with PCP regarding proper diabetes management within the outpatient setting  Hypertension Patient'sBPtoday117/78. He denies chest painanddyspnea. -Continue amlodipine5mg and hydralazine 100 mgtid daily -Continue to monitor vitals including BP  Atrial flutter s/p ablation Patient's home medication include amiodarone and apixaban -Continueamiodarone -ContinueEliquis -Continuous cardiac monitoring  Hyperlipidemia -Atorvastatin 40 mg -Consider discussion with patient on importance of relative measures by incorporating appropriate lifestyle modifications including healthy diet and adequate physical activity to work in conjunction with current pharmacologic therapy.  Bipolar disorder Patient currently denies any symptoms including racing thoughts, mood is appropriate. -Abilify 5 mg daily  CHF -Continue to monitor fluid status -Dialysis TTS -Continue to monitor I/Os  GERD -Patient denies any reflux and epigastric pain at this time. -ContinueProtonix  Polysubstance abuse We will ensure that he is accompaniedwhenhe is allotted time outside. Nurse found patient ingesting "chalk." Bag was still  in the room, most of the bag was in [place], but it was determined that the chalk was actually clay, which the patient confirmed. Patient reportedthat this is a cultural remedy for ingesting Vitamins. Patient was under the impression that the chalk was from [facility] where his family is from and had previously participated in the practice of ingesting chalk for nutrients. The bag has been removed from the patient's room and placed with his possessions.  - Continue to monitor patient  Hx ofHomelessness Per chart review patient has history of homelessness. Patient  reports that heisnot homeless at this time and that he is moving back to [place] soon. Patient does acknowledge that he missed his dialysis appointment because his ride to the dialysis appointment fell through. -Social work consulted for confirmation that patient has stable living situation  Buttocks wound Patient denies any complaints at this time. Patient denies any associated pain.RN is educating patient on wound hygiene and prevention of transferring bacteria into his wounds. -We will continue to monitor for any changes insymptoms     FEN/GI: Renal carb modified PPx: Eliquis  Disposition: Inpatient  Subjective:  Patient had no overnight events. Patient reports he is having bilateral scrotal pain that is worse with movement. He reports that he is no longer having r. Shoulder pain and ask how his abx tx is going. He excited that he should only have about 2- 3 more weeks left.    Objective: Temp:  [97.9 F (36.6 C)-98.8 F (37.1 C)] 98.4 F (36.9 C) (07/16 2134) Pulse Rate:  [76-94] 94 (07/16 2134) Resp:  [18-20] 18 (07/16 2134) BP: (110-126)/(69-86) 122/83 (07/16 2134) SpO2:  [94 %-100 %] 100 % (07/16 2134) Weight:  [135 kg-138 kg] 135 kg (07/16 1835) Physical Exam: General: Resting in bed. Cardiovascular: Regular heart rate and rythym Respiratory: Clear and equal to bil. Auscl., no extra work of breathing  noted Abdomen: Normoactive bowel sounds, remains distended, no pain to palpation Extremities: BKA noted, Patient appears to have feeling in the lateral aspect of his thighs and flexion at the hip causes pain in his scrotum bilaterally  Laboratory: Recent Labs  Lab 06/26/20 1529 06/27/20 0458 06/28/20 1025  WBC 10.6* 10.1 8.3  HGB 7.5* 7.0* 7.0*  HCT 24.4* 22.9* 23.5*  PLT 228 212 197   Recent Labs  Lab 06/26/20 1529 06/27/20 0458 06/28/20 1025  NA 133* 132* 135  K 4.4 4.8 4.2  CL 93* 94* 98  CO2 25 20* 25  BUN 55* 66* 45*  CREATININE 7.04* 7.63* 6.34*  CALCIUM 8.9 8.9 8.5*  PROT  --   --  7.7  BILITOT  --   --  0.5  ALKPHOS  --   --  308*  ALT  --   --  8  AST  --   --  22  GLUCOSE 153* 184* 138*      Imaging/Diagnostic Tests: 06/29/2020- RUQ [place] pending  Freida Busman, MD 06/29/2020, 3:09 AM PGY-1, [place] Intern pager: [phone], text pages welcome

## 2020-06-30 DIAGNOSIS — R188 Other ascites: Secondary | ICD-10-CM

## 2020-06-30 DIAGNOSIS — K7460 Unspecified cirrhosis of liver: Secondary | ICD-10-CM

## 2020-06-30 LAB — GLUCOSE, CAPILLARY
Glucose-Capillary: 285 mg/dL — ABNORMAL HIGH (ref 70–99)
Glucose-Capillary: 235 mg/dL — ABNORMAL HIGH (ref 70–99)
Glucose-Capillary: 201 mg/dL — ABNORMAL HIGH (ref 70–99)
Glucose-Capillary: 221 mg/dL — ABNORMAL HIGH (ref 70–99)

## 2020-06-30 LAB — PREPARE RBC (CROSSMATCH)

## 2020-06-30 MED ORDER — DARBEPOETIN ALFA 200 MCG/0.4ML IJ SOSY
200.0000 ug | PREFILLED_SYRINGE | INTRAMUSCULAR | Status: DC
  Filled 2020-06-30: qty 0.4

## 2020-06-30 MED ORDER — SODIUM CHLORIDE 0.9% IV SOLUTION: Freq: Once | INTRAVENOUS | Status: DC

## 2020-06-30 NOTE — Progress Notes (Signed)
Pharmacy Antibiotic Note  Lucas [name] is a 49 y.o. male admitted on 06/09/2020 with enterococcal bacteremia. Now found to have a mitral valve vegetation. Pharmacy has been consulted for Daptomycin dosing. Also on Zyvox for Gent-R Enterococcal MV IE.    The patient is ESRD - s/ptunneled HD catheter per IR7/6/21.  Pt is now on routine TTS dialysis schedule and tolerting. Adjusted body weight ~110 kg.   Received extra UF session on 7/16  Plan: - Daptomycin 800 mg IV following HD Tues/Thur and 1000 mg following HD on Sat until 8/9  - Continue Zyvox 600 mg po every 12 hours until 7/28 - Will continue to monitor HD tolerance and schedule - Q Monday CK (due 7/19)     Height: 6\' 4"  (193 cm) Weight: 130.1 kg (286 lb 12.4 oz) IBW/kg (Calculated) : 86.8  Temp (24hrs), Avg:98.1 F (36.7 C), Min:97.4 F (36.3 C), Max:98.7 F (37.1 C)  Recent Labs  Lab 06/24/20 0434 06/26/20 1529 06/27/20 0458 06/28/20 1025 06/29/20 2021 06/29/20 2251  WBC 10.1 10.6* 10.1 8.3 8.3  --   CREATININE 8.06* 7.04* 7.63* 6.34*  --  4.38*    Estimated Creatinine Clearance: 30.4 mL/min (A) (by C-G formula based on SCr of 4.38 mg/dL (H)).    No Known Allergies   Linezolid 7/1>> (7/29) Dapto 7/1> (8/9)  Vanc 6/28>>7/1 Gent 6/29>> 6/30  7/2 CK 66 7/5 CK 78 7/12 CK 169    6/27 MRSA PCR + 6/27 BCx: Enterococcus faecalis (BCID Enterococcus) (S-amp, vanc) Gent synergy resistant 6/28 BCx: Enterococcus faecalis 6/30 BCx: ngtd   Thank you for allowing pharmacy to be a part of this patient's care.  Dimple Nanas, PharmD PGY-1 Acute Care Pharmacy Resident Office: [phone] 06/30/2020 9:48 AM

## 2020-06-30 NOTE — Progress Notes (Signed)
Pt was heard at the nurses station yelling out "Hey, help me!" This RN and charge RN goes into patient's room to find pt laying in the bed and wanting to get up. Pt appeared to still be very groggy/sleepy and was talking about being in a cabin instead of the hospital and needing to go outside of the cabin to smoke. For safety reasons, RN told pt that he was still half asleep and needed to wait until he was more awake before trying to get out of the bed. Pt yelled out, "stop telling me that I am too sleepy, you are always doing this and I want to get out of this bed!" Pt proceeded to pull himself up and started to put himself onto his stumps on the floor while continuing to fuss at this nurse and charge RN. This RN and charge RN tried to help hold onto pt as he lowered himself. Pt stated "oh, this is higher than I thought it was" as he lowered himself down. Pt asked this RN and charge RN to put pillows under his stumps while he leaned against the bed. Shortly after this, pt wants to get back in the bed but refuses to have nurses get him up with a lift, so he pulls himself up into the bed. PT then demands that his dressing on his right stump be changed now, even though pt refused to have dressing changed earlier in the shift. Pt does not want dressing done the way the order states and refuses to have a foam over xeroform. Pt wanted Kerlix and an ace bandage around it instead. This RN and charge RN tried to explain the importance of following the directions of the [place] nurse's instructions for his wound, but pt still refused to have foam over xeroform and insisted that we put kerlix and an ace bandage.   Eleanora Neighbor, RN

## 2020-06-30 NOTE — Progress Notes (Signed)
Patient ID: Lucas [name], male   DOB: [birth year], 49 y.o.   MRN: [id number] S:   HD yesterday 4L UF  Hb 6.8, has been around 7, 6.8 last night    O:BP 128/83 (BP Location: Left Arm)    Pulse 86    Temp 98 F (36.7 C) (Oral)    Resp 18    Ht 6\' 4"  (1.93 m)    Wt 130.1 kg    SpO2 96%    BMI 34.91 kg/m   Intake/Output Summary (Last 24 hours) at 06/30/2020 1018 Last data filed at 06/30/2020 5573 Gross per 24 hour  Intake 1020 ml  Output 4000 ml  Net -2980 ml   Intake/Output: I/O last 3 completed shifts: In: 1696 [P.O.:1580; IV Piggyback:116] Out: 4000 [Other:4000]    Intake/Output this shift:  No intake/output data recorded. Weight change: -0.107 kg Gen: NAD, CVS: no rub Resp: cta Abd: distended, + wall edema Ext: s/p bilateral BKAs with 1+ edema  Scrotum swelling improved L IJ TDC bandaged  Recent Labs  Lab 06/24/20 0434 06/26/20 1529 06/27/20 0458 06/28/20 1025 06/29/20 2251  NA 132* 133* 132* 135 137  K 4.9 4.4 4.8 4.2 3.5  CL 95* 93* 94* 98 98  CO2 21* 25 20* 25 25  GLUCOSE 93 153* 184* 138* 250*  BUN 65* 55* 66* 45* 24*  CREATININE 8.06* 7.04* 7.63* 6.34* 4.38*  ALBUMIN  --   --   --  2.4*  --   CALCIUM 8.9 8.9 8.9 8.5* 8.6*  AST  --   --   --  22  --   ALT  --   --   --  8  --    Liver Function Tests: Recent Labs  Lab 06/28/20 1025  AST 22  ALT 8  ALKPHOS 308*  BILITOT 0.5  PROT 7.7  ALBUMIN 2.4*   No results for input(s): LIPASE, AMYLASE in the last 168 hours. No results for input(s): AMMONIA in the last 168 hours. CBC: Recent Labs  Lab 06/24/20 0434 06/24/20 0434 06/26/20 1529 06/26/20 1529 06/27/20 0458 06/28/20 1025 06/29/20 2021  WBC 10.1   < > 10.6*   < > 10.1 8.3 8.3  NEUTROABS 8.2*  --   --   --   --   --   --   HGB 7.3*   < > 7.5*   < > 7.0* 7.0* 6.8*  HCT 23.5*   < > 24.4*   < > 22.9* 23.5* 22.8*  MCV 93.6  --  93.8  --  93.1 94.8 95.8  PLT 207   < > 228   < > 212 197 224   < > = values in this interval not displayed.    Cardiac Enzymes: Recent Labs  Lab 06/24/20 0434  CKTOTAL 169   CBG: Recent Labs  Lab 06/29/20 0713 06/29/20 1153 06/29/20 1850 06/29/20 2102 06/30/20 0656  GLUCAP 293* 193* 154* 183* 285*    Iron Studies:  No results for input(s): IRON, TIBC, TRANSFERRIN, FERRITIN in the last 72 hours. Studies/Results: US Abdomen Limited  Result Date: 06/29/2020 CLINICAL DATA:  Hepatic cirrhosis.  Ascites. EXAM: ULTRASOUND ABDOMEN LIMITED RIGHT UPPER QUADRANT COMPARISON:  Abdomen and pelvis CT dated 03/30/2020 FINDINGS: Gallbladder: Poorly distended. Diffuse wall thickening with a maximum thickness of 6.8 mm. No gallstones seen in the gallbladder and no sonographic Murphy sign Common bile duct: Diameter: 4.4 mm Liver: Mildly heterogeneous with nodular contours. No visible mass. Portal vein is patent on color  Doppler imaging with normal direction of blood flow towards the liver. Other: Small to moderate amount of free peritoneal fluid. IMPRESSION: 1. Poorly distended gallbladder with diffuse wall thickening. This is most likely due to hypoproteinemia. Right heart failure can also produce this appearance. Acalculous cholecystitis is unlikely in the absence of a sonographic Murphy sign. 2. Changes of cirrhosis of the liver with an associated small to moderate amount of ascites. Electronically Signed   By: Claudie Revering M.D.   On: 06/29/2020 10:41    sodium chloride   Intravenous Once   acetaminophen  1,000 mg Oral Q6H WA   amiodarone  200 mg Oral Daily   amLODipine  5 mg Oral Daily   apixaban  5 mg Oral BID   ARIPiprazole  5 mg Oral Daily   atorvastatin  40 mg Oral Daily   carvedilol  12.5 mg Oral BID   Chlorhexidine Gluconate Cloth  6 each Topical Q0600   Chlorhexidine Gluconate Cloth  6 each Topical Q0600   darbepoetin (ARANESP) injection - DIALYSIS  150 mcg Intravenous Q Sat-HD   diclofenac Sodium  4 g Topical QID   doxercalciferol  4 mcg Oral Q T,Th,Sa-HD   gabapentin  300 mg  Oral QHS   hydrALAZINE  100 mg Oral TID   insulin aspart  0-15 Units Subcutaneous TID WC   insulin aspart  0-5 Units Subcutaneous QHS   insulin glargine  20 Units Subcutaneous Daily   lidocaine  1 patch Transdermal Q24H   linezolid  600 mg Oral Q12H   melatonin  5 mg Oral QHS   nystatin   Topical BID   nystatin cream   Topical BID   pantoprazole  40 mg Oral Daily   polyethylene glycol  17 g Oral Daily   QUEtiapine  100 mg Oral QHS   sevelamer carbonate  1,600 mg Oral TID with meals   sodium chloride flush  10-40 mL Intracatheter Q12H   tamsulosin  0.4 mg Oral Daily    BMET    Component Value Date/Time   NA 137 06/29/2020 2251   K 3.5 06/29/2020 2251   CL 98 06/29/2020 2251   CO2 25 06/29/2020 2251   GLUCOSE 250 (H) 06/29/2020 2251   BUN 24 (H) 06/29/2020 2251   CREATININE 4.38 (H) 06/29/2020 2251   CALCIUM 8.6 (L) 06/29/2020 2251   GFRNONAA 15 (L) 06/29/2020 2251   GFRAA 17 (L) 06/29/2020 2251   CBC    Component Value Date/Time   WBC 8.3 06/29/2020 2021   RBC 2.38 (L) 06/29/2020 2021   HGB 6.8 (LL) 06/29/2020 2021   HCT 22.8 (L) 06/29/2020 2021   PLT 224 06/29/2020 2021   MCV 95.8 06/29/2020 2021   MCH 28.6 06/29/2020 2021   MCHC 29.8 (L) 06/29/2020 2021   RDW 18.0 (H) 06/29/2020 2021   LYMPHSABS 0.6 (L) 06/24/2020 0434   MONOABS 1.0 06/24/2020 0434   EOSABS 0.1 06/24/2020 0434   BASOSABS 0.0 06/24/2020 0434   [facility] [place] [facility]) Phone [phone] Scheduled TTS 4 hours His outpatient unit reports noncompliance BF 400; DF 800 F250 dialyzer  2/2.5 ca bath EDW 250 lbs Last weight 272.2 lbs on 6/24 (signed off early and had skipped the treatment prior) aranesp 120 mcg weekly  hectoral 4 mcg each tx  Assessment/Plan:  1. Enterococcus bacteremia/Mitral valve Endocarditis- due to tunneled HD catheter. TDC removed 6/29 s/p temp HD catheter placed 06/14/20.Now with new LIJ [place] placed 7/6. Will continue withIVcubicin  andoralzyvoxper ID 1. Not candidate for surgery due to multiple co-morbidities, social issues, and polysubstance abuse 2. Noted vegetation on TTE 6/29 3. daptomycin through 07/22/20 and oral linezolid for 4 weeks (his home HD unit does have daptomycin) 4. Follow up with ID on 07/08/20 2. ESRDcontinue with TTS schedule; catheter replaced by IR on 06/18/20. 1. TTS schedule here - Usual HD on schedule:  4h, 4-5L UF, 2K. Tight heparin. TDC.  3. Anemia:due to ESRD. Continue with ESA. inrease dose. Transfusion per primary. ASx currently.  7/9 TSAT 16% and ferritin 561 holding IV Fe w/ #1 4. CKD-MBD:continue with home meds 5. Nutrition:renal diet 6. Hypertension:BPs srtable, CTM 7. Vascular access-s/pL IJ tunneled HD catheter per IR7/6/21. 8. Disposition- SW to assist with transportation to his outpatient unit.Stable for discharge from renal standpoint  [facility]

## 2020-06-30 NOTE — Progress Notes (Signed)
Family Medicine Teaching Service Daily Progress Note Intern Pager: [phone]  Patient name: Lucas [name] Medical record number: [id number] Date of birth: [birth year] Age: 49 y.o. Gender: male  Primary Care Provider: System, Pcp Not In Consultants: Nephrology, cardiology, infectious disease Code Status: Full Code   Pt Overview and Major Events to Date:  Admitted for hyperglycemia and port pain on 06/09/2020 Enterococcus grew on blood cultures on 06/10/2020 TTE demonstrated mitral valve vegetation on 06/11/2020 Removal of right IJ HD catheter on 06/11/2020 TEE completed on 06/13/2020 Patient received left IJ nontunneled, restarted HD on 06/14/2020 Patient received long-term tunneled left IJ on 06/19/2020  Antibiotics Patient's current antibiotics and duration listed below. Gentamicin 6/29-6/30 Vancomycin 6/28-6/29 Linezolid 7/1-7/28 Daptomycin 7/1-8/9  Assessment and Plan: Lucas [name] a 49 y.o.malewith Enterococcus endocarditis. PMH is significant foratrial flutter s/p ablation, T2DM, ESRD, hyperlipidemia, anemia, CHF, GERD, polysubstance abuseandhomelessness.  Enterococcus endocarditis Patienthas been compliant with his treatmentthusfar. He recognizes that he may be in the hospital for an extended period.CVTS noted that he is not a good surgical candidate due to his significant comorbidities and current social situation. They advised following up in clinic following completion of his antibiotic course. -Nephrologyfollowing,patient stable for discharge from renal standpoint -Infectious disease sign off, 7/3, will f/u with patient 7/26 -ContinueLinezolidPOday17/28 -Continue IV daptomycin day17/42,consult pharmacy for dosing with HD -weekly labs: CBC w/ diff, BMP, CK -Continue to follow-up on repeat blood cultures from after his line was pulled -Patientmost recentHgb 6.8 from 7/17. Given 1 unit of blood on 7/18, patient had previous transfusion during this  hospitalization on 7/10.   Scrotal swelling Patient today complaining of10/10 pain, swelling has improved with decreased tenderness.[place] and CT pelvis of scrotum revealed diffuse scrotal wall edema present. Although patient has a history of ESRD, since he is not having any output, Dr. Johnney Ou (nephrologist) agreed that it was appropriate to get imaging with contrast to rule out fournier gangrene. CT pelvis w/ contrast demonstratedearly vascular necrosis of the left femoral head, diffuse nonspecific scrotal wall edema, ascitesand enlarged inguinal and pelvic lymph nodes. My suspicion is that patient may be experiencing referred pain to his groin in addition to fluid overload which may be contributing to his scrotal edema. Patient had a RUQ [place] in March 2021 showing cirrhosis and paracentesis in April.RUQ USon 7/17 demonstrated poorly distended gallbladder with diffuse thickening and changes of cirrhosis of the liver with an associated small to moderate amount of ascites.  -Continue to ice and elevate -Continue dialysis, next HD on Tues 7/20 -Monitor I/Os, continue fluid restriction  -4L removed from last HD session -Hepatic panel given imaging yielding ascites  -Continue nystatin -GGT 425 -Consider paracentesis -Consider GI consult tomorrow CT showing ascites    Infected dialysis catheter Following a line holiday, and new leftnontunneledIJ catheter was placed on 7/2.New IJ tunneled catheter placed7/7, patient had bleeding after the surgery. IR sutured with dissolvable sutures at the bedside, no f/u needed. -Continue to monitor catheter site -Back on TTS schedule for dialysis -Tramadol twice daily as needed for pain -Flexeril 5mg  q 8hrs PRN for muscle pain -Continue to monitor CBCs  Right shoulder pain Patient right sided shoulder pain most likely resolved, he denies any pain associated with it today. -Continuegabapentin300 mg -Voltaren gel, tylenol, flexeril as needed for  pain -Continue PT -Continue to monitor -May consider imaging if worsening symptoms -Include PT recommendation of outpatient physical therapy 3 times a week in discharge summary   Right chest/neck pain Stableand resolved.Patient noticedsome right chest/neck pain since his  admission. Patientpain appears to be managed well with voltaren gel.His chest pain has not bothered him for a few daysand denies chest pain today.We will continue to monitor as X ray has a low sensitivity for osteomyelitis and therefore it cannot be completly ruled out. CT could also be used to detect septic joint. PT recommends outpatient PT, 3x/ week. -Continue to monitor - Flexeril 5mg  q 8hrs PRN for likely muscle pain - May consider CT of shoulder if pain continues  - Voltaren gel 4x daily scheduled - Continue PT for shoulder  -Include PT recommendation of outpatient physical therapy 3 times a week in discharge summary  Right forearm swelling Stable.No swelling noted, likely resolved.Possible thrombus in the right upper extremityhas been ruled out.  -VENOUSVAS US of the upper extremities found no evidence of DVT in the UE, no evidence of superficial vein thrombosis, no evidence of thrombosis in the subclavian. - Will continue to monitor -ContinueEliquis   Leftbicep pain Likely resolved.Patienthas previouslyreportedthat he began having pain in hismedial,left bicep after procedurebut denies experiencing any pain currently. The painat the time wasconstant, but is worse with movement. The pain follows a cord-like pattern. The patient also reports that he feels more short of breath after his surgery.But denies both dyspnea and left biceps pain. -Performed venous US of L upper extremitywhich demonstrated no evidence of DVT or superficial vein thrombosis in the left upper extremity.  Type 2 diabetes Most recentHgb A1c11.5 on 06/09/2020. CBG 285 (7/17)>235  (today). -ContinueLantus -Continue to monitor blood glucose levels and adjust as needed -SSI moderate -At bedtime coverage -Continue gabapentin for diabetic neuropathy -Ordered prostheticsfor bilateral lower extremityper PT recs -Consider encouraging patient to follow-up with PCP regarding proper diabetes management within the outpatient setting  Hypertension Patient'sBPtoday133/72. He denies chest painanddyspnea. -Continue amlodipine5mg and hydralazine 100 mgtid daily -Continue to monitor vitals including BP  Atrial flutter s/p ablation Patient's home medication include amiodarone and apixaban -Continueamiodarone -ContinueEliquis -Continuous cardiac monitoring  Hyperlipidemia -Atorvastatin 40 mg -Consider discussion with patient on importance of relative measures by incorporating appropriate lifestyle modifications including healthy diet and adequate physical activity to work in conjunction with current pharmacologic therapy.  Bipolar disorder Patient currently denies any symptoms including racing thoughts, mood is appropriate. -Abilify 5 mg daily  CHF -Continue to monitor fluid status -Dialysis TTS -Continue to monitor I/Os  GERD -Patient denies any reflux and epigastric pain at this time. -ContinueProtonix  Polysubstance abuse We will ensure that he is accompaniedwhenhe is allotted time outside. Nurse found patient ingesting "chalk." Bag was still in the room, most of the bag was in [place], but it was determined that the chalk was actually clay, which the patient confirmed. Patient reportedthat this is a cultural remedy for ingesting Vitamins. Patient was under the impression that the chalk was from [facility] where his family is from and had previously participated in the practice of ingesting chalk for nutrients. The bag has been removed from the patient's room and placed with his possessions.  - Continue to monitor patient  Hx  ofHomelessness Per chart review patient has history of homelessness. Patient reports that heisnot homeless at this time and that he is moving back to [facility] soon. Patient does acknowledge that he missed his dialysis appointment because his ride to the dialysis appointment fell through. -Social work consulted for confirmation that patient has stable living situation  Buttocks wound Patient denies any complaints at this time. Patient denies any associated pain.RN is educating patient on wound hygiene and prevention of transferring bacteria  into his wounds. -We will continue to monitor for any changes insymptoms  FEN/GI: renal carb modified  PPx: Eliquis  Disposition: Inpatient   Subjective:  Overnight events include Hgb of 6.8. Patient's scrotum examined with a chaperone, appears that the edema has improved and is less tender. Patient admits that nystatin is improving. He admits to 10/10 scrotal pain with movements making it worse. Discussed  Hgb of 6.8 with patient, since next HD session is not until 7/20, 1 unit of blood was given. Patient agreed to this plan.   Objective: Temp:  [97.4 F (36.3 C)-98.7 F (37.1 C)] 98.4 F (36.9 C) (07/18 0622) Pulse Rate:  [74-97] 89 (07/18 0622) Resp:  [16-19] 19 (07/18 0622) BP: (89-144)/(66-97) 133/72 (07/18 0622) SpO2:  [95 %-100 %] 95 % (07/18 0622) Weight:  [130.1 kg-137.9 kg] 130.1 kg (07/17 2101) Physical Exam: General: Patient laying in the bed sleeping, in no acute distress. Cardiovascular: regular rate and rhythm, no murmurs or gallops noted Respiratory: lungs clear to auscultation bilaterally  Abdomen: distended but improved from last HD session, active bowel sounds, nontender Extremities: knee amputation from below the knee bilaterally, FADIR test elicited pain, negative FABER GU: scrotal edema improving but still noted   Laboratory: Recent Labs  Lab 06/27/20 0458 06/28/20 1025 06/29/20 2021  WBC 10.1 8.3 8.3  HGB  7.0* 7.0* 6.8*  HCT 22.9* 23.5* 22.8*  PLT 212 197 224   Recent Labs  Lab 06/27/20 0458 06/28/20 1025 06/29/20 2251  NA 132* 135 137  K 4.8 4.2 3.5  CL 94* 98 98  CO2 20* 25 25  BUN 66* 45* 24*  CREATININE 7.63* 6.34* 4.38*  CALCIUM 8.9 8.5* 8.6*  PROT  --  7.7  --   BILITOT  --  0.5  --   ALKPHOS  --  308*  --   ALT  --  8  --   AST  --  22  --   GLUCOSE 184* 138* 250*      Imaging/Diagnostic Tests: No results found.  Donney Dice, DO 06/30/2020, 8:03 AM PGY-1, [place] Intern pager: [phone], text pages welcome

## 2020-07-01 DIAGNOSIS — R932 Abnormal findings on diagnostic imaging of liver and biliary tract: Secondary | ICD-10-CM

## 2020-07-01 DIAGNOSIS — R748 Abnormal levels of other serum enzymes: Secondary | ICD-10-CM

## 2020-07-01 LAB — GLUCOSE, CAPILLARY
Glucose-Capillary: 130 mg/dL — ABNORMAL HIGH (ref 70–99)
Glucose-Capillary: 158 mg/dL — ABNORMAL HIGH (ref 70–99)
Glucose-Capillary: 106 mg/dL — ABNORMAL HIGH (ref 70–99)
Glucose-Capillary: 138 mg/dL — ABNORMAL HIGH (ref 70–99)

## 2020-07-01 LAB — CBC WITH DIFFERENTIAL/PLATELET
Abs Immature Granulocytes: 0.13 10*3/uL — ABNORMAL HIGH (ref 0.00–0.07)
Basophils Absolute: 0 10*3/uL (ref 0.0–0.1)
Basophils Relative: 0 %
Eosinophils Absolute: 0.2 10*3/uL (ref 0.0–0.5)
Eosinophils Relative: 2 %
HCT: 24.8 % — ABNORMAL LOW (ref 39.0–52.0)
Hemoglobin: 7.3 g/dL — ABNORMAL LOW (ref 13.0–17.0)
Immature Granulocytes: 2 %
Lymphocytes Relative: 9 %
Lymphs Abs: 0.7 10*3/uL (ref 0.7–4.0)
MCH: 27.9 pg (ref 26.0–34.0)
MCHC: 29.4 g/dL — ABNORMAL LOW (ref 30.0–36.0)
MCV: 94.7 fL (ref 80.0–100.0)
Monocytes Absolute: 1.1 10*3/uL — ABNORMAL HIGH (ref 0.1–1.0)
Monocytes Relative: 14 %
Neutro Abs: 6.1 10*3/uL (ref 1.7–7.7)
Neutrophils Relative %: 73 %
Platelets: 200 10*3/uL (ref 150–400)
RBC: 2.62 MIL/uL — ABNORMAL LOW (ref 4.22–5.81)
RDW: 18.2 % — ABNORMAL HIGH (ref 11.5–15.5)
WBC: 8.2 10*3/uL (ref 4.0–10.5)
nRBC: 0.7 % — ABNORMAL HIGH (ref 0.0–0.2)

## 2020-07-01 LAB — BASIC METABOLIC PANEL
Anion gap: 13 (ref 5–15)
BUN: 37 mg/dL — ABNORMAL HIGH (ref 6–20)
CO2: 23 mmol/L (ref 22–32)
Calcium: 8.6 mg/dL — ABNORMAL LOW (ref 8.9–10.3)
Chloride: 99 mmol/L (ref 98–111)
Creatinine, Ser: 5.84 mg/dL — ABNORMAL HIGH (ref 0.61–1.24)
GFR calc Af Amer: 12 mL/min — ABNORMAL LOW (ref >60)
GFR calc non Af Amer: 10 mL/min — ABNORMAL LOW (ref >60)
Glucose, Bld: 202 mg/dL — ABNORMAL HIGH (ref 70–99)
Potassium: 3.7 mmol/L (ref 3.5–5.1)
Sodium: 135 mmol/L (ref 135–145)

## 2020-07-01 LAB — CK: Total CK: 339 U/L (ref 49–397)

## 2020-07-01 LAB — TYPE AND SCREEN
ABO/RH(D): A POS
Antibody Screen: NEGATIVE
Unit division: 0

## 2020-07-01 LAB — BPAM RBC
Blood Product Expiration Date: 202108082359
ISSUE DATE / TIME: 202107181757
Unit Type and Rh: 6200

## 2020-07-01 MED ORDER — IOHEXOL 9 MG/ML PO SOLN
ORAL | Status: AC
  Administered 2020-07-01: 500 mL
  Filled 2020-07-01: qty 500

## 2020-07-01 MED ORDER — IOHEXOL 9 MG/ML PO SOLN: 500.0000 mL | ORAL | Status: AC

## 2020-07-01 MED ORDER — GABAPENTIN 100 MG PO CAPS
100.0000 mg | ORAL_CAPSULE | Freq: Every day | ORAL | Status: DC
  Administered 2020-07-01 – 2020-07-06 (×6): 100 mg via ORAL
  Filled 2020-07-01 (×6): qty 1

## 2020-07-01 MED ORDER — HEPARIN SODIUM (PORCINE) 1000 UNIT/ML IJ SOLN
INTRAMUSCULAR | Status: AC
  Administered 2020-07-01: 3800 [IU]
  Filled 2020-07-01: qty 4

## 2020-07-01 MED ORDER — GABAPENTIN 100 MG PO CAPS: 200.0000 mg | ORAL_CAPSULE | Freq: Every day | ORAL | Status: DC

## 2020-07-01 NOTE — Consult Note (Addendum)
Referring Provider:  Dr. Larae Grooms with Family Medicine Primary Care Physician:  System, Pcp Not In Primary Gastroenterologist:  Althia Forts, Dr. Ardelle Balls at [facility]  Reason for Consultation:  Newly diagnosed cirrhosis  HPI: Lucas [name] is a 49 y.o. male with Enterococcus endocarditis. PMH is significant foratrial flutter s/p ablation, T2DM, ESRD on HD TTS, hyperlipidemia, anemia of chronic disease, CHF, GERD, polysubstance abuse,andhomelessness.  Being treated for enterococcus endocarditis.  Was noted to be retaining more fluid.  Abdominal ultrasound was ordered and showed changes c/w cirrhosis and small to moderate ascites.  He denies ETOH use.  Says that his brother had cirrhosis from ETOH.  Ultrasound as follows:  IMPRESSION: 1. Poorly distended gallbladder with diffuse wall thickening. This is most likely due to hypoproteinemia. Right heart failure can also produce this appearance. Acalculous cholecystitis is unlikely in the absence of a sonographic Murphy sign. 2. Changes of cirrhosis of the liver with an associated small to moderate amount of ascites.  LFTs normal except for ALP up to 308.  Platelets are normal.  INR 1.4.  He is on Eliqius.  Viral hepatitis studies negative and iron studies c/w AOCD.   [facility]  Patient had EGD in 2018 and 2019 for hematemeses and intractable N/V. EGD 2018 revealed mild chronicgastritis of antrum and esophagitis of GE junction thought to 2/2 NSAID use. EGD in 2019 showed 33m whites based ulcer and grade D esophagitis at GE junctin at 43 cm from incisors. Stomach was notable for patchy antral erythema and biopsies were submitted for H.Pylori testing which were positive .   Looks like he had another in 12/2019 but I cannot see records.  He says that he did and that he had a hernia and ulcers.  Past Medical History:  Diagnosis Date  . [place]rial [place]   . Diabetes mellitus without complication ([place]   . Renal  disorder    end stage    Past Surgical History:  Procedure Laterality Date  . BELOW KNEE LEG AMPUTATION Bilateral   . IR FLUORO GUIDE CV LINE LEFT  06/14/2020  . IR FLUORO GUIDE CV LINE LEFT  06/18/2020  . IR REMOVAL TUN CV CATH W/O FL  06/11/2020  . IR [place] VASC ACCESS LEFT  06/14/2020  . IR [place] VASC ACCESS LEFT  06/18/2020  . TEE WITHOUT CARDIOVERSION N/A 06/13/2020   Procedure: TRANSESOPHAGEAL ECHOCARDIOGRAM (TEE);  Surgeon: CSanda Klein MD;  [place]  Service: Cardiovascular;  Laterality: N/A;    Prior to Admission medications   Medication Sig Start Date End Date Taking? Authorizing Provider  amiodarone (PACERONE) 200 MG tablet Take 200 mg by mouth daily. 04/29/20  Yes [provider]  amLODipine (NORVASC) 10 MG tablet Take 10 mg by mouth daily. 03/25/20  Yes [provider]  ARIPiprazole (ABILIFY) 5 MG tablet Take 5 mg by mouth daily. 04/16/20  Yes [provider]  atorvastatin (LIPITOR) 40 MG tablet Take 1 tablet (40 mg total) by mouth daily. 05/22/20  Yes ZMilton Ferguson MD  bumetanide (BUMEX) 2 MG tablet Take 2 mg by mouth See admin instructions. Taking 2 mg twice daily except on HD days, Tues, Thurs, and Saturday not taking. 04/28/20  Yes [provider]  buPROPion (WELLBUTRIN SR) 150 MG 12 hr tablet Take 150 mg by mouth daily. 03/25/20  Yes [provider]  carvedilol (COREG) 12.5 MG tablet Take 1 tablet (12.5 mg total) by mouth 2 (two) times daily with a meal. 05/22/20  Yes Milton Ferguson, MD  doxycycline (VIBRAMYCIN) 100 MG capsule Take 1 capsule (100 mg total) by mouth 2 (two) times daily. 05/12/20  Yes Lajean Saver, MD  ELIQUIS 5 MG TABS tablet Take 5 mg by mouth 2 (two) times daily. 04/10/20  Yes [provider]  HUMALOG 100 UNIT/ML injection Inject 2-12 Units into the skin 3 (three) times daily before meals. Per sliding scale:  BG 180-200= 2 units, 201-250= 5 units, 251-300= 7 units, 301-350= 10 units, 351-400 = 12  units, 400 Call MD 02/21/20  Yes [provider]  hydrALAZINE (APRESOLINE) 100 MG tablet Take 100 mg by mouth 3 (three) times daily. 04/29/20  Yes [provider]  insulin glargine (LANTUS) 100 UNIT/ML injection Inject 0.2 mLs (20 Units total) into the skin daily. Patient taking differently: Inject 25 Units into the skin at bedtime.  05/22/20  Yes Milton Ferguson, MD  pantoprazole (PROTONIX) 40 MG tablet Take 40 mg by mouth daily. 02/06/20  Yes [provider]  pregabalin (LYRICA) 25 MG capsule Take 25 mg by mouth at bedtime. 04/22/20  Yes [provider]  QUEtiapine (SEROQUEL) 200 MG tablet Take 200 mg by mouth at bedtime. 03/25/20  Yes [provider]  sevelamer carbonate (RENVELA) 800 MG tablet Take 1,600 mg by mouth 3 (three) times daily. 02/06/20  Yes [provider]  tamsulosin (FLOMAX) 0.4 MG CAPS capsule Take 0.4 mg by mouth daily. 03/25/20  Yes [provider]  Insulin Pen Needle 29G X 12.7MM MISC 30 Containers by Does not apply route daily. 05/22/20   Milton Ferguson, MD    Current Facility-Administered Medications  Medication Dose Route Frequency Provider Last Rate Last Admin  . 0.9 %  sodium chloride infusion (Manually program via Guardrails IV Fluids)   Intravenous Once Donato Heinz, MD      . 0.9 %  sodium chloride infusion (Manually program via Guardrails IV Fluids)   Intravenous Once Autry-Lott, Simone, DO      . acetaminophen (TYLENOL) tablet 1,000 mg  1,000 mg Oral Q6H WA Anderson, Chelsey L, DO   1,000 mg at 06/30/20 2110  . amiodarone (PACERONE) tablet 200 mg  200 mg Oral Daily Croitoru, Mihai, MD   200 mg at 06/30/20 1109  . amLODipine (NORVASC) tablet 5 mg  5 mg Oral Daily Justin Mend, MD   5 mg at 06/30/20 1107  . apixaban (ELIQUIS) tablet 5 mg  5 mg Oral BID Autry-Lott, Simone, DO   5 mg at 07/01/20 0915  . ARIPiprazole (ABILIFY) tablet 5 mg  5 mg Oral Daily Croitoru, Mihai, MD   5 mg at 06/30/20 1106  .  carvedilol (COREG) tablet 12.5 mg  12.5 mg Oral BID Croitoru, Mihai, MD   12.5 mg at 06/30/20 2110  . Chlorhexidine Gluconate Cloth 2 % PADS 6 each  6 each Topical Q0600 Claudia Desanctis, MD   6 each at 06/26/20 0600  . Chlorhexidine Gluconate Cloth 2 % PADS 6 each  6 each Topical Q0600 Claudia Desanctis, MD   6 each at 07/01/20 0700  . cyclobenzaprine (FLEXERIL) tablet 5 mg  5 mg Oral Q8H PRN Autry-Lott, Simone, DO   5 mg at 07/01/20 0920  . DAPTOmycin (CUBICIN) 1,000 mg in sodium chloride 0.9 % IVPB  1,000 mg Intravenous Once per day on Sat HammonsTheone Murdoch, RPH 240 mL/hr at 06/29/20 2125 1,000 mg at 06/29/20 2125  . DAPTOmycin (CUBICIN) 800 mg in sodium chloride 0.9 % IVPB  800 mg Intravenous  Once per day on Tue Thu Hammons, Kimberly B, [place] 232 mL/hr at 06/27/20 2150 800 mg at 06/27/20 2150  . [START ON 07/06/2020] Darbepoetin Alfa (ARANESP) injection 200 mcg  200 mcg Intravenous Q Sat-HD Pearson Grippe B, MD      . diclofenac Sodium (VOLTAREN) 1 % topical gel 4 g  4 g Topical QID Damita Dunnings B, MD   4 g at 06/30/20 1115  . doxercalciferol (HECTOROL) capsule 4 mcg  4 mcg Oral Q T,Th,Sa-HD Croitoru, Mihai, MD   4 mcg at 06/25/20 1259  . gabapentin (NEURONTIN) capsule 100 mg  100 mg Oral QHS Beard, Samantha N, DO      . hydrALAZINE (APRESOLINE) tablet 100 mg  100 mg Oral TID Croitoru, Mihai, MD   100 mg at 06/30/20 2109  . insulin aspart (novoLOG) injection 0-15 Units  0-15 Units Subcutaneous TID WC Croitoru, Mihai, MD   3 Units at 07/01/20 1145  . insulin aspart (novoLOG) injection 0-5 Units  0-5 Units Subcutaneous QHS Croitoru, Mihai, MD   2 Units at 06/30/20 2127  . insulin glargine (LANTUS) injection 20 Units  20 Units Subcutaneous Daily Darrelyn Hillock N, DO   20 Units at 07/01/20 0913  . lidocaine (LIDODERM) 5 % 1 patch  1 patch Transdermal Q24H Autry-Lott, Simone, DO   1 patch at 06/30/20 1745  . linezolid (ZYVOX) tablet 600 mg  600 mg Oral Q12H Donnamae Jude, RPH   600 mg at 07/01/20 0915  .  melatonin tablet 5 mg  5 mg Oral QHS Ezequiel Essex, MD   5 mg at 06/30/20 2109  . nystatin (MYCOSTATIN/NYSTOP) topical powder   Topical BID Autry-Lott, Simone, DO   Given at 06/30/20 2131  . nystatin cream (MYCOSTATIN)   Topical BID Donnamae Jude, [facility]   Given at 07/01/20 0915  . oxyCODONE (Oxy IR/ROXICODONE) immediate release tablet 5 mg  5 mg Oral BID PRN Autry-Lott, Simone, DO   5 mg at 07/01/20 0921  . pantoprazole (PROTONIX) EC tablet 40 mg  40 mg Oral Daily Croitoru, Mihai, MD   40 mg at 06/30/20 1109  . polyethylene glycol (MIRALAX / GLYCOLAX) packet 17 g  17 g Oral Daily Anderson, Chelsey L, DO   17 g at 06/29/20 1922  . QUEtiapine (SEROQUEL) tablet 100 mg  100 mg Oral QHS Milus Banister C, DO   100 mg at 06/30/20 2109  . sevelamer carbonate (RENVELA) tablet 1,600 mg  1,600 mg Oral TID with meals Croitoru, Mihai, MD   1,600 mg at 07/01/20 1145  . sodium chloride flush (NS) 0.9 % injection 10-40 mL  10-40 mL Intracatheter Q12H McDiarmid, Blane Ohara, MD   10 mL at 06/27/20 1403  . sodium chloride flush (NS) 0.9 % injection 10-40 mL  10-40 mL Intracatheter PRN McDiarmid, Blane Ohara, MD   10 mL at 06/15/20 2048  . tamsulosin (FLOMAX) capsule 0.4 mg  0.4 mg Oral Daily Croitoru, Mihai, MD   0.4 mg at 06/30/20 1108    Allergies as of 06/09/2020  . (No Known Allergies)    History reviewed. No pertinent family history.  Social History   Socioeconomic History  . Marital status: Unknown    Spouse name: Not on file  . Number of children: Not on file  . Years of education: Not on file  . Highest education level: Not on file  Occupational History  . Not on file  Tobacco Use  . Smoking status: Never Smoker  . Smokeless tobacco: Never  Used  Substance and Sexual Activity  . Alcohol use: Not on file  . Drug use: Not on file  . Sexual activity: Not on file  Other Topics Concern  . Not on file  Social History Narrative  . Not on file   Social Determinants of Health   Financial Resource  Strain:   . Difficulty of Paying Living Expenses:   Food Insecurity:   . Worried About Charity fundraiser in the Last Year:   . Arboriculturist in the Last Year:   Transportation Needs:   . Film/video editor (Medical):   Marland Kitchen Lack of Transportation (Non-Medical):   Physical Activity:   . Days of Exercise per Week:   . Minutes of Exercise per Session:   Stress:   . Feeling of Stress :   Social Connections:   . Frequency of Communication with Friends and Family:   . Frequency of Social Gatherings with Friends and Family:   . Attends Religious Services:   . Active Member of Clubs or Organizations:   . Attends Archivist Meetings:   Marland Kitchen Marital Status:   Intimate Partner Violence:   . Fear of Current or Ex-Partner:   . Emotionally Abused:   Marland Kitchen Physically Abused:   . Sexually Abused:     Review of Systems: ROS is O/W negative except as mentioned in HPI.  Physical Exam: Vital signs in last 24 hours: Temp:  [97.9 F (36.6 C)-98.7 F (37.1 C)] 98.4 F (36.9 C) (07/19 0653) Pulse Rate:  [76-92] 76 (07/19 0653) Resp:  [14-19] 19 (07/19 0653) BP: (117-141)/(61-88) 120/78 (07/19 0653) SpO2:  [92 %-98 %] 92 % (07/19 0653) Last BM Date: 06/28/20 General:  Alert, Well-developed, chronically ill-appearing, cooperative in NAD Head:  Normocephalic and atraumatic. Eyes:  Sclera clear, no icterus.  Conjunctiva pink. Ears:  Normal auditory acuity. Mouth:  No deformity or lesions.   Lungs:  Clear throughout to auscultation.  No wheezes, crackles, or rhonchi.  Heart:  Regular rate and rhythm; no murmurs, clicks, rubs, or gallops. Abdomen:  Soft but distended in upper abdomen mostly.  BS present.  Non-tender.  Msk:  Symmetrical without gross deformities. Pulses:  Normal pulses noted. Extremities:  B/L BKA Neurologic:  Alert and oriented x 4;  grossly normal neurologically. Skin:  Intact without significant lesions or rashes. Psych:  Alert and cooperative. Normal mood and  affect.  Intake/Output from previous day: 07/18 0701 - 07/19 0700 In: 1530 [P.O.:1140; Blood:390] Out: 0  Intake/Output this shift: Total I/O In: 460 [P.O.:460] Out: 0   Lab Results: Recent Labs    06/29/20 2021 07/01/20 0031  WBC 8.3 8.2  HGB 6.8* 7.3*  HCT 22.8* 24.8*  PLT 224 200   BMET Recent Labs    06/29/20 2251 07/01/20 0031  NA 137 135  K 3.5 3.7  CL 98 99  CO2 25 23  GLUCOSE 250* 202*  BUN 24* 37*  CREATININE 4.38* 5.84*  CALCIUM 8.6* 8.6*   IMPRESSION:  1. ? Cirrhosis with ascites seen on ultrasound.  ALP elevated at 308, others normal.  INR 1.4, but ? If this is affected by Eliquis.  Platelets are normal.  Viral hepatitis studies negative. 2. Enterococcus bacteremia/Mitral valve Endocarditis- due to tunneled HD catheter. TDC removed 6/29 s/p temp HD catheter placed 06/14/20.Now with new LIJ Ward placed 7/6. OnIVcubicin andoralzyvoxper ID 3. ESRDon HD TTS 4. Anemia due to ESRD:  Iron studies c/w anemia of chronic disease.  No sign  of GI bleeding.  S/p EGD in 12/2019 at [facility] but results unavailable.  PLAN: -Will order CT scan abdomen with contrast to better evaluate for cirrhosis and portal hypertension. -Will check AFP and extensive serologic evaluation to rule out other causes of chronic liver disease. -Paracentesis can be performed prn.   Laban Emperor. Zehr  07/01/2020, 3:20 PM  GI ATTENDING  HISTORY, LABS, X-RAYS REVIEWED personally. Agree with comprehensive consultation note as written. The question of hepatic cirrhosis raised on ultrasound. I am not certain, at this time, that the patient has liver disease. The changes on ultrasound are not reliable. The ascites and elevated alkaline phosphatase might both be attributable to his ESRD. The other liver tests, platelets, INR (1.3 on Eliquis) are normal. Will expand workup to include advanced imaging and a myriad of serologies assessing for chronic liver disease. Will follow. Thank you.   Docia Chuck.  Geri Seminole., M.D. [facility] Division of Gastroenterology

## 2020-07-01 NOTE — Progress Notes (Signed)
Pt was just crawling into the hallway asking where his room is. RN and NT told pt that he was at the hospital and that he was in his room already. Pt is very easily agitated and refused to use lift to get back into bed. Pt pulled himself back into bed. RN messaged MD on call to make aware of patient's behavior.   Eleanora Neighbor, RN

## 2020-07-01 NOTE — Progress Notes (Addendum)
Physical Therapy Treatment Patient Details Name: Lucas [name] MRN: [id number] DOB: [birth year] Today's Date: 07/01/2020    History of Present Illness Lucas [name] is a 49 y.o. male admitted secondary to enterococcus endocarditis. PMH is significant for atrial flutter s/p ablation, T2DM, ESRD, hyperlipidemia, anemia, CHF, GERD, polysubstance abuse.    PT Comments    Continuing work on functional mobility and activity tolerance;  He seems to enjoy therapeutic exercises, and I encouraged him to perfom them daily, especially hamstring stretching, and hip extension range and strengthening with the goal of getting prostheses;   He has met the goals of bed mobility, and while his scrotal pain and swelling have gotten in the way of transfers, I posit that he transfers well; Majority of goals set on initial PT eval met -- Now, to consider functional goals for Lucas [name], we must take into account his prior level of function; this DOES include the fact that at home he would lower himself onto the floor, and crawl for mobility (he even brought his kneepads) -- while this is problematic here in the hospital, it is his baseline, and how he has solved the challenge of mobility and maintaining his independence; Will consider adding a floor to bed transfer goal to our acute PT goals;   Lucas [name] tells me that he did meet with a prosthetist from [facility], and that he wants to pursue getting bil porstheses; it gets a bit confusing: according to pt, [facility] in [place] has his prosthesis, and it was being re-fitted; I called [facility] in [place], and they indicated that he has not made appointments in quite some time --they did not indicate that his prosthesis is at their clinic; I plan to reach out to [facility] for some more clarity;   I find myself wondering if his existing DME, especially his wheelchair, is meeting his needs.  Let's get more information re: exactly how he manages getting to and from [place] [facility];   Highly  recommend Lucas [name] get out of bed and undergo dialysis in HD chair consistently.  Follow Up Recommendations  Outpatient PT     Equipment Recommendations  Other (comment) (Drop-arm BSC, sliding board)    Recommendations for Other Services       Precautions / Restrictions Precautions Precautions: Fall Precaution Comments: prior bilateral BKA's Other Brace: Tells me he spoke with a Prosthetist from [facility] bed mobility: Modified Independent Bed Mobility: Supine to Sit     Supine to sit: Modified independent (Device/Increase time)     General bed mobility comments: pt able to move around in bed and reposition himself without difficulties; limited secondary to testicular pain 2/2 edema  Transfers                 General transfer comment: Politely declining   Ambulation/Gait                 Stairs             Wheelchair Mobility    Modified [place] (Stroke Patients Only)       Balance                                            Cognition Arousal/Alertness: Awake/alert Behavior During Therapy: WFL for tasks assessed/performed Overall Cognitive Status: Within Functional Limits for tasks assessed (for simple  mobility) Area of Impairment: Safety/judgement                         Safety/Judgement: Decreased awareness of safety     General Comments: Needs safety/judgement cues;       Exercises General Exercises - Lower Extremity Gluteal Sets: Strengthening;10 reps Other Exercises Other Exercises: pt performed resisted elbow flexion/extension exercises bilaterally x20 Other Exercises: Performed isometric hip abductionx10 reps Other Exercises: Active hip extension in sidelying, each side, x10 with manual holding Other Exercises: supine hamstring stretching both sides x 3 with 10-30 second hold, and use of gait belt for self stretch Other Exercises: Bolstered bridging x 10     General Comments  While performing supine self hamstring stretching, at one point the strap slid off the top of his residual limb while he was pulling pretty hard to stretch, which led to Lucas [name] knocking his hands and the strap buckle (plastic) against his face; sustained a cut to his inner upper lip on the right-front side; Applied 4x4s, and notified Anisha, RN, who came in to assess;   It is also notable that pt had removed the dressing from his R residual limb at some point before PT session today      Pertinent Vitals/Pain Pain Assessment: Faces Faces Pain Scale: Hurts little more Pain Location: groin Pain Descriptors / Indicators: Grimacing;Guarding Pain Intervention(s): Monitored during session    Home Living                      Prior Function            PT Goals (current goals can now be found in the care plan section) Acute Rehab PT Goals Patient Stated Goal: to get prothestics for both legs PT Goal Formulation: With patient Time For Goal Achievement: 07/15/20 Potential to Achieve Goals: Good Progress towards PT goals: Progressing toward goals (Seems to enjoy exercise; See discussion above in PT comments)    Frequency    Min 3X/week      PT Plan Current plan remains appropriate    Co-evaluation              AM-PAC PT "6 Clicks" Mobility   Outcome Measure  Help needed turning from your back to your side while in a flat bed without using bedrails?: None Help needed moving from lying on your back to sitting on the side of a flat bed without using bedrails?: None Help needed moving to and from a bed to a chair (including a wheelchair)?: A Little Help needed standing up from a chair using your arms (e.g., wheelchair or bedside chair)?: Total Help needed to walk in hospital room?: Total Help needed climbing 3-5 steps with a railing? : Total 6 Click Score: 14    End of Session   Activity Tolerance: Patient limited by pain Patient left: in  bed;with call bell/phone within reach Nurse Communication: Mobility status PT Visit Diagnosis: Other abnormalities of gait and mobility (R26.89);Muscle weakness (generalized) (M62.81);Pain Pain - Right/Left: Right Pain - part of body: Shoulder     Time: 3005-1102 PT Time Calculation (min) (ACUTE ONLY): 30 min  Charges:  $Therapeutic Exercise: 8-22 mins $Therapeutic Activity: 8-22 mins                     Roney Marion, PT  Acute Rehabilitation Services Pager [phone] Office [place] 07/01/2020, 8:11 PM

## 2020-07-01 NOTE — Progress Notes (Signed)
Patient ID: Rishan Oyama, male   DOB: June 02, 1971, 49 y.o.   MRN: [id number] S:   Reports more SOB, cannot lay flat, still has discomfort in his scrotum. He feels like he needs fluid removed/HD today  O:BP 120/78 (BP Location: Left Arm)   Pulse 76   Temp 98.4 F (36.9 C)   Resp 19   Ht 6\' 4"  (1.93 m)   Wt 130.1 kg   SpO2 92%   BMI 34.91 kg/m   Intake/Output Summary (Last 24 hours) at 07/01/2020 0802 Last data filed at 07/01/2020 0616 Gross per 24 hour  Intake 1530 ml  Output 0 ml  Net 1530 ml   Intake/Output: I/O last 3 completed shifts: In: 2250 [P.O.:1860; Blood:390] Out: 0     Intake/Output this shift:  No intake/output data recorded. Weight change:  Gen: uncomfortable appearing CVS: no rub Resp: diminished air entry bibasilar, increased WOB Abd: distended, + wall edema Ext: s/p bilateral BKAs with 1+ edema (thigh) GU: scrotal swelling L IJ TDC bandaged  Recent Labs  Lab 06/26/20 1529 06/27/20 0458 06/28/20 1025 06/29/20 2251 07/01/20 0031  NA 133* 132* 135 137 135  K 4.4 4.8 4.2 3.5 3.7  CL 93* 94* 98 98 99  CO2 25 20* 25 25 23   GLUCOSE 153* 184* 138* 250* 202*  BUN 55* 66* 45* 24* 37*  CREATININE 7.04* 7.63* 6.34* 4.38* 5.84*  ALBUMIN  --   --  2.4*  --   --   CALCIUM 8.9 8.9 8.5* 8.6* 8.6*  AST  --   --  22  --   --   ALT  --   --  8  --   --    Liver Function Tests: Recent Labs  Lab 06/28/20 1025  AST 22  ALT 8  ALKPHOS 308*  BILITOT 0.5  PROT 7.7  ALBUMIN 2.4*   No results for input(s): LIPASE, AMYLASE in the last 168 hours. No results for input(s): AMMONIA in the last 168 hours. CBC: Recent Labs  Lab 06/26/20 1529 06/26/20 1529 06/27/20 0458 06/27/20 0458 06/28/20 1025 06/29/20 2021 07/01/20 0031  WBC 10.6*   < > 10.1   < > 8.3 8.3 8.2  NEUTROABS  --   --   --   --   --   --  6.1  HGB 7.5*   < > 7.0*   < > 7.0* 6.8* 7.3*  HCT 24.4*   < > 22.9*   < > 23.5* 22.8* 24.8*  MCV 93.8  --  93.1  --  94.8 95.8 94.7  PLT 228   < > 212   <  > 197 224 200   < > = values in this interval not displayed.   Cardiac Enzymes: Recent Labs  Lab 07/01/20 0031  CKTOTAL 339   CBG: Recent Labs  Lab 06/30/20 0656 06/30/20 1111 06/30/20 1639 06/30/20 2110 07/01/20 0654  GLUCAP 285* 235* 201* 221* 130*    Iron Studies:  No results for input(s): IRON, TIBC, TRANSFERRIN, FERRITIN in the last 72 hours. Studies/Results: No results found. . sodium chloride   Intravenous Once  . sodium chloride   Intravenous Once  . acetaminophen  1,000 mg Oral Q6H WA  . amiodarone  200 mg Oral Daily  . amLODipine  5 mg Oral Daily  . apixaban  5 mg Oral BID  . ARIPiprazole  5 mg Oral Daily  . atorvastatin  40 mg Oral Daily  . carvedilol  12.5 mg Oral BID  .  Chlorhexidine Gluconate Cloth  6 each Topical Q0600  . Chlorhexidine Gluconate Cloth  6 each Topical Q0600  . [START ON 07/06/2020] darbepoetin (ARANESP) injection - DIALYSIS  200 mcg Intravenous Q Sat-HD  . diclofenac Sodium  4 g Topical QID  . doxercalciferol  4 mcg Oral Q T,Th,Sa-HD  . gabapentin  300 mg Oral QHS  . hydrALAZINE  100 mg Oral TID  . insulin aspart  0-15 Units Subcutaneous TID WC  . insulin aspart  0-5 Units Subcutaneous QHS  . insulin glargine  20 Units Subcutaneous Daily  . lidocaine  1 patch Transdermal Q24H  . linezolid  600 mg Oral Q12H  . melatonin  5 mg Oral QHS  . nystatin   Topical BID  . nystatin cream   Topical BID  . pantoprazole  40 mg Oral Daily  . polyethylene glycol  17 g Oral Daily  . QUEtiapine  100 mg Oral QHS  . sevelamer carbonate  1,600 mg Oral TID with meals  . sodium chloride flush  10-40 mL Intracatheter Q12H  . tamsulosin  0.4 mg Oral Daily    BMET    Component Value Date/Time   NA 135 07/01/2020 0031   K 3.7 07/01/2020 0031   CL 99 07/01/2020 0031   CO2 23 07/01/2020 0031   GLUCOSE 202 (H) 07/01/2020 0031   BUN 37 (H) 07/01/2020 0031   CREATININE 5.84 (H) 07/01/2020 0031   CALCIUM 8.6 (L) 07/01/2020 0031   GFRNONAA 10 (L)  07/01/2020 0031   GFRAA 12 (L) 07/01/2020 0031   CBC    Component Value Date/Time   WBC 8.2 07/01/2020 0031   RBC 2.62 (L) 07/01/2020 0031   HGB 7.3 (L) 07/01/2020 0031   HCT 24.8 (L) 07/01/2020 0031   PLT 200 07/01/2020 0031   MCV 94.7 07/01/2020 0031   MCH 27.9 07/01/2020 0031   MCHC 29.4 (L) 07/01/2020 0031   RDW 18.2 (H) 07/01/2020 0031   LYMPHSABS 0.7 07/01/2020 0031   MONOABS 1.1 (H) 07/01/2020 0031   EOSABS 0.2 07/01/2020 0031   BASOSABS 0.0 07/01/2020 0031   Outpatient: [facility]) Phone [phone] Scheduled TTS 4 hours His outpatient unit reports noncompliance BF 400; DF 800 F250 dialyzer  2/2.5 ca bath EDW 250 lbs Last weight 272.2 lbs on 6/24 (signed off early and had skipped the treatment prior) aranesp 120 mcg weekly  hectoral 4 mcg each tx  Assessment/Plan:  1. Enterococcus bacteremia/Mitral valve Endocarditis- due to tunneled HD catheter. TDC removed 6/29 s/p temp HD catheter placed 06/14/20.Now with new LIJ [place] placed 7/6. Will continue withIVcubicin andoralzyvoxper ID 1. Not candidate for surgery due to multiple co-morbidities, social issues, and polysubstance abuse 2. Noted vegetation on TTE 6/29 3. daptomycin through 07/22/20 and oral linezolid for 4 weeks (his home HD unit does have daptomycin). Consult pharmacy in regards to dosing of dapto after HD today 4. Follow up with ID on 07/08/20 2. ESRDcontinue with TTS schedule; catheter replaced by IR on 06/18/20. 1. TTS schedule here - Usual HD on schedule:  4h, 4-5L UF, 2K. Tight heparin. LIJ TDC.  2. Will plan for extra treatment today: sequential/dry UF session. Will plan for 2.5hr treatment with 3L total UF as tolerated. Will be placed back on his regular schedule tomorrow 3. Anemia:due to ESRD. Continue with ESA. increased dose. Transfusion per primary. ASx currently.  7/9 TSAT 16% and ferritin 561 holding IV Fe w/ #1 4. CKD-MBD:continue with home meds.  Monitor phos.  5. Nutrition:renal diet 6. Hypertension:BPs srtable, CTM 7. Vascular access-s/pL IJ tunneled HD catheter per IR7/6/21. 8. Disposition- SW to assist with transportation to his outpatient unit.Stable for discharge from renal standpoint  Gean Quint, MD [place]

## 2020-07-01 NOTE — Progress Notes (Addendum)
Family Medicine Teaching Service Daily Progress Note Intern Pager: [phone]  Patient name: Lucas [name] Medical record number: [id number] Date of birth: 1971/09/14 Age: 49 y.o. Gender: male  Primary Care Provider: System, Pcp Not In Consultants: Nephrology, cardiology, infectious disease  Code Status: Full Code   Pt Overview and Major Events to Date:  Admitted for hyperglycemia and port pain on 06/09/2020 Enterococcus grew on blood cultures on 06/10/2020 TTE demonstrated mitral valve vegetation on 06/11/2020 Removal of right IJ HD catheter on 06/11/2020 TEE completed on 06/13/2020 Patient received left IJ nontunneled, restarted HD on 06/14/2020 Patient received long-term tunneled left IJ on 06/19/2020  Antibiotics Patient's current antibiotics and duration listed below. Gentamicin 6/29-6/30 Vancomycin 6/28-6/29 Linezolid 7/1-7/28 Daptomycin 7/1-8/9  Assessment and Plan: Lucas [name] a 49 y.o.malewith Enterococcus endocarditis. PMH is significant foratrial flutter s/p ablation, T2DM, ESRD, hyperlipidemia, anemia, CHF, GERD, polysubstance abuseandhomelessness.  Enterococcus endocarditis Patienthas been compliant with his treatmentthusfar. He recognizes that he may be in the hospital for an extended period.CVTS noted that he is not a good surgical candidate due to his significant comorbidities and current social situation. They advised following up in clinic following completion of his antibiotic course. -Nephrologyfollowing,patient stable for discharge from renal standpoint -Infectious disease sign off, 7/3, will f/u with patient 7/26 -ContinueLinezolidPOday18/28 -Continue IV daptomycin day18/42,consult pharmacy for dosing with HD -weekly labs: CBC w/ diff, BMP, CK -Continue to follow-up on repeat blood cultures from after his line was pulled -Hgb today 7.3 after 1 unit of blood transfused due to Hgb 6.8  Scrotal swelling Patient today complaining of8/10 pain,  swelling has improved with decreased tenderness.USand CT pelvisof scrotum revealed diffuse scrotal wall edema present. Although patient has a history of ESRD, since he is not having any output, Dr. Johnney Ou (nephrologist) agreed that it was appropriate to get imaging with contrast to rule out fournier gangrene. CT pelvis w/ contrast demonstratedearly vascular necrosis of the left femoral head, diffuse nonspecific scrotal wall edema, ascitesand enlarged inguinal and pelvic lymph nodes. My suspicion is that patient may be experiencing referred pain to his groin in addition to fluid overload which may be contributing to his scrotal edema. Patient had a RUQ [place] in March 2021 showing cirrhosis and paracentesis in April.RUQ USon 7/17 demonstrated poorly distended gallbladder with diffuse thickening and changes of cirrhosis of the liver with an associated small to moderate amount of ascites. Hepatic panel showed non reactive to both Hepatitis B surface antigen and HCV antibody. Albumin 2.4 and alkaline phosphatase 308. GGT 425. -Continue to ice and elevate -Continue dialysis -Monitor I/Os, continue fluid restriction  -4L removed from last HD session -Continue nystatin -Consulted GI for concern of cirrhosis, appreciate recs   Infected dialysis catheter Following a line holiday, and new leftnontunneledIJ catheter was placed on 7/2.New IJ tunneled catheter placed7/7, patient had bleeding after the surgery. IR sutured with dissolvable sutures at the bedside, no f/u needed. -Continue to monitor catheter site -Back on TTS schedule for dialysis -Tramadol twice daily as needed for pain -Flexeril 5mg  q 8hrs PRN for muscle pain -Continue to monitor CBCs  Right shoulder pain Patient right sided shoulder pain most likely resolved, he denies any pain associated with it today. -Continuegabapentin300 mg -Voltaren gel, tylenol, flexeril as needed for pain -Continue PT -Continue to monitor -May  consider imaging if worsening symptoms -Include PT recommendation of outpatient physical therapy 3 times a week in discharge summary   Right chest/neck pain Stableand resolved.Patient noticedsome right chest/neck pain since his admission. Patientpain appears to be  managed well with voltaren gel.His chest pain has not bothered him for a few daysand denies chest pain today.We will continue to monitor as X ray has a low sensitivity for osteomyelitis and therefore it cannot be completly ruled out. CT could also be used to detect septic joint. PT recommends outpatient PT, 3x/ week. -Continue to monitor - Flexeril 5mg  q 8hrs PRN for likely muscle pain - May consider CT of shoulder if pain continues  - Voltaren gel 4x daily scheduled - Continue PT for shoulder  -Include PT recommendation of outpatient physical therapy 3 times a week in discharge summary  Right forearm swelling Stable.No swelling noted, likely resolved.Possible thrombus in the right upper extremityhas been ruled out.  -VENOUSVAS US of the upper extremities found no evidence of DVT in the UE, no evidence of superficial vein thrombosis, no evidence of thrombosis in the subclavian. - Will continue to monitor -ContinueEliquis   Leftbicep pain Likely resolved.Patienthas previouslyreportedthat he began having pain in hismedial,left bicep after procedurebut denies experiencing any pain currently. The painat the time wasconstant, but is worse with movement. The pain follows a cord-like pattern. The patient also reports that he feels more short of breath after his surgery.But denies both dyspnea and left biceps pain. -Performed venous US of L upper extremitywhich demonstrated no evidence of DVT or superficial vein thrombosis in the left upper extremity.  Type 2 diabetes Most recentHgb A1c11.5 on 06/09/2020. Blood glucose 202  -ContinueLantus -Continue to monitor blood glucose levels and adjust as  needed -SSI moderate -At bedtime coverage -Continue gabapentin for diabetic neuropathy -Ordered prostheticsfor bilateral lower extremityper PT recs -Consider encouraging patient to follow-up with PCP regarding proper diabetes management within the outpatient setting  Hypertension Patient'sBPtoday120/78. He denies chest painanddyspnea. -Continue amlodipine5mg and hydralazine 100 mgtid daily -Continue to monitor vitals including BP  Atrial flutter s/p ablation Patient's home medication include amiodarone and apixaban -Continueamiodarone -ContinueEliquis -Continuous cardiac monitoring  Hyperlipidemia -Held Atorvastatin 40 mg -CK 339 wnl today but increasing, likely in the event of daptomycin. Awaiting repeat CK -Consider discussion with patient on importance of relative measures by incorporating appropriate lifestyle modifications including healthy diet and adequate physical activity to work in conjunction with current pharmacologic therapy.  Bipolar disorder Patient currently denies any symptoms including racing thoughts, mood is appropriate. -Abilify 5 mg daily  CHF -Continue to monitor fluid status -Dialysis TTS -Continue to monitor I/Os  GERD -Patient denies any reflux and epigastric pain at this time. -ContinueProtonix  Polysubstance abuse We will ensure that he is accompaniedwhenhe is allotted time outside. Nurse found patient ingesting "chalk." Bag was still in the room, most of the bag was in [place], but it was determined that the chalk was actually clay, which the patient confirmed. Patient reportedthat this is a cultural remedy for ingesting Vitamins. Patient was under the impression that the chalk was from [facility] where his family is from and had previously participated in the practice of ingesting chalk for nutrients. The bag has been removed from the patient's room and placed with his possessions.  - Continue to monitor patient  Hx  ofHomelessness Per chart review patient has history of homelessness. Patient reports that heisnot homeless at this time and that he is moving back to [facility] soon. Patient does acknowledge that he missed his dialysis appointment because his ride to the dialysis appointment fell through. -Social work consulted for confirmation that patient has stable living situation  Buttocks wound Patient denies any complaints at this time. Patient denies any associated pain.RN  is educating patient on wound hygiene and prevention of transferring bacteria into his wounds. -We will continue to monitor for any changes insymptoms  FEN/GI: renal carb modified  PPx: Eliquis  Disposition: Inpatient  Subjective:  Overnight events include confusion, he was found crawling in the hallway. When asked, patient is aware that he was confused and thought that he was meeting some friends who were here. Denies hallucinations.He reports that he occasionally gets vivid dreams with gabapentin which is why he stopped taking it 2 years ago. He reports 8/10 scrotal pain but denies chest pain, nausea, vomiting. Has been eating well.   Objective: Temp:  [97.9 F (36.6 C)-98.7 F (37.1 C)] 98.7 F (37.1 C) (07/18 2121) Pulse Rate:  [86-92] 92 (07/18 2121) Resp:  [14-19] 19 (07/18 2121) BP: (117-141)/(61-88) 128/88 (07/18 2121) SpO2:  [95 %-98 %] 97 % (07/18 2121) Physical Exam: General: patient in no acute distress Cardiovascular: regular rate and rhythm, no murmurs or gallops noted  Respiratory: lungs clear to auscultation bilaterally, no rhonchi or rales appreciated  Abdomen: less distended, nontender on palpation, active bowel sounds Extremities: knee amputation from below the knee bilaterally, FADIR test elicited slight pain, negative FABER Neuro: AOx4, appropriate conversation  Psych: mood appropriate, no agitation noted  GU: very mild scrotal edema noted   Laboratory: Recent Labs  Lab 06/28/20 1025  06/29/20 2021 07/01/20 0031  WBC 8.3 8.3 8.2  HGB 7.0* 6.8* 7.3*  HCT 23.5* 22.8* 24.8*  PLT 197 224 200   Recent Labs  Lab 06/28/20 1025 06/29/20 2251 07/01/20 0031  NA 135 137 135  K 4.2 3.5 3.7  CL 98 98 99  CO2 25 25 23   BUN 45* 24* 37*  CREATININE 6.34* 4.38* 5.84*  CALCIUM 8.5* 8.6* 8.6*  PROT 7.7  --   --   BILITOT 0.5  --   --   ALKPHOS 308*  --   --   ALT 8  --   --   AST 22  --   --   GLUCOSE 138* 250* 202*      Imaging/Diagnostic Tests: No results found.  Donney Dice, DO 07/01/2020, 6:15 AM PGY-1, [place] Intern pager: [phone], text pages welcome

## 2020-07-01 NOTE — Progress Notes (Signed)
Received page from nurse notifying me about patients confusion and agitation.   S: Alert and oriented to person, place, time. States that the Gabapentin has been making him have vivid dreams. Had a dream that he was in another patients room and they were drinking. States a lot of people were there. When he woke up, we went into the hallway to see where the people went. He tells me that last night he had a dream he was at his aunts house. This has happened to him on Gabapentin before but he states it helps to calm his nerves. He denies hitting his head or any injuries from his "controlled fall" onto the ground. Denies any pain. Patient tells me he is receiving a package today for his birthday and he is excited about it.   O:   Gen: Patient seen in bed on 4L [place]. Alert and oriented to person, place and time. Patient appears drowsy, eyelids swollen.  Cardio: RRR Abd: non-tender to palpation, distended Psych: flat affect  Neuro: patient is tremulous, speech is slurred but comprehensible  A/P:   Confusion, slurred speech, increased drowsiness - Patient with hx IV drug use. Medication side effect vs. Substance abuse? - Serum tox screen - 1:1 sitter, pending availability - Continue to monitor

## 2020-07-02 ENCOUNTER — Inpatient Hospital Stay (HOSPITAL_COMMUNITY): Payer: Medicaid Other

## 2020-07-02 LAB — GLUCOSE, CAPILLARY
Glucose-Capillary: 140 mg/dL — ABNORMAL HIGH (ref 70–99)
Glucose-Capillary: 229 mg/dL — ABNORMAL HIGH (ref 70–99)
Glucose-Capillary: 241 mg/dL — ABNORMAL HIGH (ref 70–99)
Glucose-Capillary: 214 mg/dL — ABNORMAL HIGH (ref 70–99)
Glucose-Capillary: 185 mg/dL — ABNORMAL HIGH (ref 70–99)
Glucose-Capillary: 231 mg/dL — ABNORMAL HIGH (ref 70–99)

## 2020-07-02 LAB — CBC
HCT: 23.9 % — ABNORMAL LOW (ref 39.0–52.0)
Hemoglobin: 7 g/dL — ABNORMAL LOW (ref 13.0–17.0)
MCH: 28.1 pg (ref 26.0–34.0)
MCHC: 29.3 g/dL — ABNORMAL LOW (ref 30.0–36.0)
MCV: 96 fL (ref 80.0–100.0)
Platelets: 197 10*3/uL (ref 150–400)
RBC: 2.49 MIL/uL — ABNORMAL LOW (ref 4.22–5.81)
RDW: 18.2 % — ABNORMAL HIGH (ref 11.5–15.5)
WBC: 7.7 10*3/uL (ref 4.0–10.5)
nRBC: 0.5 % — ABNORMAL HIGH (ref 0.0–0.2)

## 2020-07-02 LAB — BASIC METABOLIC PANEL
Anion gap: 13 (ref 5–15)
BUN: 49 mg/dL — ABNORMAL HIGH (ref 6–20)
CO2: 23 mmol/L (ref 22–32)
Calcium: 8.7 mg/dL — ABNORMAL LOW (ref 8.9–10.3)
Chloride: 97 mmol/L — ABNORMAL LOW (ref 98–111)
Creatinine, Ser: 7.38 mg/dL — ABNORMAL HIGH (ref 0.61–1.24)
GFR calc Af Amer: 9 mL/min — ABNORMAL LOW (ref >60)
GFR calc non Af Amer: 8 mL/min — ABNORMAL LOW (ref >60)
Glucose, Bld: 261 mg/dL — ABNORMAL HIGH (ref 70–99)
Potassium: 4 mmol/L (ref 3.5–5.1)
Sodium: 133 mmol/L — ABNORMAL LOW (ref 135–145)

## 2020-07-02 LAB — AMMONIA: Ammonia: 36 umol/L — ABNORMAL HIGH (ref 9–35)

## 2020-07-02 MED ORDER — HEPARIN SODIUM (PORCINE) 1000 UNIT/ML IJ SOLN
INTRAMUSCULAR | Status: AC
  Filled 2020-07-02: qty 1

## 2020-07-02 MED ORDER — DOXERCALCIFEROL 4 MCG/2ML IV SOLN
INTRAVENOUS | Status: AC
  Administered 2020-07-02: 4 ug
  Filled 2020-07-02: qty 2

## 2020-07-02 MED ORDER — IOHEXOL 300 MG/ML  SOLN
125.0000 mL | Freq: Once | INTRAMUSCULAR | Status: AC | PRN
  Administered 2020-07-02: 125 mL via INTRAVENOUS

## 2020-07-02 NOTE — Progress Notes (Addendum)
Occupational Therapy Treatment Patient Details Name: Lucas [name] MRN: [id number] DOB: 13-Apr-1971 Today's Date: 07/02/2020    History of present illness Lucas [name] is a 49 y.o. male admitted secondary to enterococcus endocarditis. PMH is significant for atrial flutter s/p ablation, T2DM, ESRD, hyperlipidemia, anemia, CHF, GERD, polysubstance abuse.   OT comments  Patient sitting up in bed on arrival.  He reports decreased scrotum pain and swelling this session.  Worked on using button hook today with UE dressing.  Patient's finger numbness and decreased fine motor skills limiting ability to manipulate tools and items for ADLs.  Educated on how it is used and practiced.  Session ended quickkly as patient was taken to dialysis.  Will continue to follow with OT acutely to address the deficits listed below.    Follow Up Recommendations  Outpatient OT    Equipment Recommendations  3 in 1 bedside commode    Recommendations for Other Services      Precautions / Restrictions Precautions Precautions: Fall Precaution Comments: prior bilateral BKA's Other Brace: Tells me he spoke with a Prosthetist from [employer] Restrictions Weight Bearing Restrictions: No       Mobility Bed Mobility Overal bed mobility: Modified Independent Bed Mobility: Supine to Sit              Transfers                      Balance                                           ADL either performed or assessed with clinical judgement   ADL Overall ADL's : Needs assistance/impaired     Grooming: Set up;Bed level           Upper Body Dressing : Minimal assistance;Standing                     General ADL Comments: Session cut short by patient being taken to dialysis     Vision       Perception     Praxis      Cognition Arousal/Alertness: Awake/alert Behavior During Therapy: WFL for tasks assessed/performed Overall Cognitive Status: No family/caregiver  present to determine baseline cognitive functioning Area of Impairment: Safety/judgement                         Safety/Judgement: Decreased awareness of safety     General Comments: Decreased problem solving, requiring cueing for steps of using button hook.        Exercises     Shoulder Instructions       General Comments Patient reporting reduced swelling and scrotum pain    Pertinent Vitals/ Pain       Pain Assessment: No/denies pain  Home Living                                          Prior Functioning/Environment              Frequency  Min 2X/week        Progress Toward Goals  OT Goals(current goals can now be found in the care plan section)  Progress towards OT goals: Progressing toward goals  Acute Rehab OT Goals Patient  Stated Goal: to get prothestics for both legs OT Goal Formulation: With patient Time For Goal Achievement: 07/16/20 Potential to Achieve Goals: Good  Plan Discharge plan remains appropriate    Co-evaluation                 AM-PAC OT "6 Clicks" Daily Activity     Outcome Measure   Help from another person eating meals?: None Help from another person taking care of personal grooming?: A Little Help from another person toileting, which includes using toliet, bedpan, or urinal?: A Lot Help from another person bathing (including washing, rinsing, drying)?: A Little Help from another person to put on and taking off regular upper body clothing?: A Little Help from another person to put on and taking off regular lower body clothing?: A Little 6 Click Score: 18    End of Session Equipment Utilized During Treatment: Oxygen  OT Visit Diagnosis: Other abnormalities of gait and mobility (R26.89);Muscle weakness (generalized) (M62.81);Other symptoms and signs involving cognitive function;Other symptoms and signs involving the nervous system (R29.898)   Activity Tolerance Patient tolerated treatment  well   Patient Left in bed;with call bell/phone within reach   Nurse Communication Mobility status        Time: 1120-1130 OT Time Calculation (min): 10 min  Charges: OT General Charges $OT Visit: 1 Visit OT Treatments $Self Care/Home Management : 8-22 mins  August Luz, OTR/L    Phylliss Bob 07/02/2020, 1:51 PM

## 2020-07-02 NOTE — Progress Notes (Addendum)
[facility] Progress Note  CC:  Cirrhosis  Subjective:  Patient was in dialysis and I did not visit him there today.  He was not physically seen by our service today.  Chart and labs reviewed.  Objective:  Vital signs in last 24 hours: Temp:  [97.5 F (36.4 C)-98.5 F (36.9 C)] 98.5 F (36.9 C) (07/20 1018) Pulse Rate:  [83-90] 86 (07/20 1018) Resp:  [14-18] 14 (07/20 1018) BP: (122-141)/(77-91) 132/89 (07/20 1018) SpO2:  [93 %-100 %] 99 % (07/20 1018) Weight:  [135.9 kg-140.4 kg] 135.9 kg (07/19 2130) Last BM Date: 06/28/20  Patient not examined today (currently in dialysis).  Intake/Output from previous day: 07/19 0701 - 07/20 0700 In: 1180 [P.O.:1180] Out: 3000   Lab Results: Recent Labs    06/29/20 2021 07/01/20 0031 07/02/20 0627  WBC 8.3 8.2 7.7  HGB 6.8* 7.3* 7.0*  HCT 22.8* 24.8* 23.9*  PLT 224 200 197   BMET Recent Labs    06/29/20 2251 07/01/20 0031 07/02/20 0627  NA 137 135 133*  K 3.5 3.7 4.0  CL 98 99 97*  CO2 25 23 23   GLUCOSE 250* 202* 261*  BUN 24* 37* 49*  CREATININE 4.38* 5.84* 7.38*  CALCIUM 8.6* 8.6* 8.7*    CT ABDOMEN W CONTRAST  Result Date: 07/02/2020 CLINICAL DATA:  Abdominal distension. Assess cirrhosis and portal hypertension. Chronic renal failure. EXAM: CT ABDOMEN WITH CONTRAST TECHNIQUE: Multidetector CT imaging of the abdomen was performed using the standard protocol following bolus administration of intravenous contrast. CONTRAST:  164mL OMNIPAQUE IOHEXOL 300 MG/ML  SOLN COMPARISON:  CT scan 06/27/2020 FINDINGS: Examination is limited by body habitus. Lower chest: Small bilateral pleural effusions with overlying left basilar atelectasis. The heart is upper limits of normal in size. Coronary artery calcifications are noted. Hepatobiliary: Very heterogeneous appearance of the liver probably geographic fatty infiltration. Cirrhotic changes are suggested with irregular liver contour and increased caudate to right  lobe ratio. The portal and hepatic veins are patent. No obvious hepatic lesions. Gallbladder is mildly contracted. Suspect gallbladder sludge. No intra or extrahepatic biliary dilatation. Pancreas: No mass, inflammation or ductal dilatation. Spleen: Normal size. No focal lesions. Adrenals/Urinary Tract: The adrenal glands are unremarkable. The kidneys are small but no renal calculi or mass. Stomach/Bowel: The stomach, duodenum, visualized small bowel and visualized colon are grossly normal. The bowel is centralized due to large volume abdominal ascites. Vascular/Lymphatic: The aorta is normal in caliber. Age advanced vascular calcifications. Prominent upper abdominal lymph nodes, typical with cirrhosis. Other: Large volume abdominal ascites. There is also diffuse body wall edema suggesting anasarca. Musculoskeletal: No significant bony findings. IMPRESSION: 1. Very heterogeneous appearance of the liver probably geographic fatty infiltration. 2. Cirrhotic changes but no obvious hepatic lesions. 3. Large volume abdominal ascites and diffuse body wall edema suggesting anasarca. 4. Small bilateral pleural effusions with overlying left basilar atelectasis. 5. Age advanced vascular calcifications. Electronically Signed   By: Marijo Sanes M.D.   On: 07/02/2020 08:21   Assessment / Plan: 1. Cirrhosis with ascites seen on ultrasound.  ALP elevated at 308, others normal.  INR 1.4, but ? If this is affected by Eliquis.  Platelets are normal.  Viral hepatitis studies negative.  CT scan also shows cirrhosis and large volume ascites and diffuse body wall edema suggesting anasarca. 2. Enterococcus bacteremia/Mitral valve Endocarditis- due to tunneled HD catheter. TDC removed 6/29 s/p temp HD catheter placed 06/14/20.Now with new LIJ Clayton placed 7/6. OnIVcubicin andoralzyvoxper ID. 3. ESRDon  HD TTS 4. Anemia due to ESRD:  Iron studies c/w anemia of chronic disease.  No sign of GI bleeding.  S/p EGD in 12/2019 at [facility]  but results unavailable.  -Liver serologies all pending.  Will follow-up with those. -Prn paracentesis for symptomatic ascites.   LOS: 23 days   Laban Emperor. Zehr  07/02/2020, 12:43 PM  GI ATTENDING  Interval history and data reviewed.  CT reviewed.  Agree with interval progress note as outlined above.  Changes of liver parenchyma suggest cirrhosis.  However no evidence for portal hypertension despite ascites.  Liver serologies pending.  It may be helpful to perform diagnostic paracentesis to see if ascites is consistent with liver disease, or other.  Docia Chuck. Geri Seminole., M.D. [facility] Gastroenterology

## 2020-07-02 NOTE — Plan of Care (Signed)
Bed mobility goals met; Will consider floor transfer goal, and add consistent therex goal;   Highly recommend the patient  get out of bed and undergo dialysis in HD chair consistently.   Roney Marion, [place]  Acute Rehabilitation Services Pager [phone] Office [phone]

## 2020-07-02 NOTE — Progress Notes (Signed)
Pharmacy Antibiotic Note  Lucas [name] is a 49 y.o. male admitted on 06/09/2020 with enterococcal bacteremia. Now found to have a mitral valve vegetation. Pharmacy has been consulted for Daptomycin dosing. Also on Zyvox for Gent-R Enterococcal MV IE.    The patient is ESRD - s/ptunneled HD catheter per IR7/6/21.  Pt is now on routine TTS dialysis schedule and tolerting well. Has received some extra sessions of ultrafiltration, no extra daptomycin is needed for these sessions. Adjusted body weight ~110 kg. CK is up trending, held statin, will repeat on 7/22 and if still uptrending, decrease daptomycin dose. Discussed with ID,patient does not have other good options for antibiotics.   Plan: - Daptomycin 800 mg IV following HD Tues/Thur and 1000 mg following HD on Sat until 8/9  -F/u CK 7/22 and if still uptrending, decrease daptomycin dose to 6/6/9mg /kg post HD TTS - Continue Zyvox 600 mg po every 12 hours until 7/28 - Will continue to monitor HD tolerance and schedule - F/u Q Monday CK     Height: 6\' 4"  (193 cm) Weight: 135.9 kg (299 lb 9.7 oz) IBW/kg (Calculated) : 86.8  Temp (24hrs), Avg:97.9 F (36.6 C), Min:97.5 F (36.4 C), Max:98.5 F (36.9 C)  Recent Labs  Lab 06/27/20 0458 06/28/20 1025 06/29/20 2021 06/29/20 2251 07/01/20 0031 07/02/20 0627  WBC 10.1 8.3 8.3  --  8.2 7.7  CREATININE 7.63* 6.34*  --  4.38* 5.84* 7.38*    Estimated Creatinine Clearance: 18.4 mL/min (A) (by C-G formula based on SCr of 7.38 mg/dL (H)).    No Known Allergies   Linezolid 7/1>> (7/29) Dapto 7/1> (8/9) Vanc 6/28>>7/1 Gent 6/29>> 6/30  7/2 CK 66 7/5 CK 78 7/12 CK 169   7/19 CK 339  6/27 MRSA PCR + 6/27 BCx: Enterococcus faecalis (BCID Enterococcus) (S-amp, vanc) Gent synergy resistant 6/28 BCx: Enterococcus faecalis 6/30 BCx: ngtd   Thank you for allowing pharmacy to be a part of this patient's care.  Benetta Spar, PharmD, BCPS, BCCP Clinical Pharmacist  Please check AMION  for all [place] phone numbers After 10:00 PM, call [place] [phone]

## 2020-07-02 NOTE — Progress Notes (Signed)
Family Medicine Teaching Service Daily Progress Note Intern Pager: [phone]  Patient name: Lucas [name] Medical record number: [id number] Date of birth: [birth year] Age: 49 y.o. Gender: male  Primary Care Provider: System, Pcp Not In Consultants: Nephrology, cardiology, infectious disease, gastroenterology  Code Status: Full  Pt Overview and Major Events to Date:  Admitted for hyperglycemia and port pain on 06/09/2020 Enterococcus grew on blood cultures on 06/10/2020 TTE demonstrated mitral valve vegetation on 06/11/2020 Removal of right IJ HD catheter on 06/11/2020 TEE completed on 06/13/2020 Patient received left IJ nontunneled, restarted HD on 06/14/2020 Patient received long-term tunneled left IJ on 06/19/2020  Antibiotics Patient's current antibiotics and duration listed below. Gentamicin 6/29-6/30 Vancomycin 6/28-6/29 Linezolid 7/1-7/28 Daptomycin 7/1-8/9  Assessment and Plan: Lucas Alfordis a 49 y.o.malewith Enterococcus endocarditis. PMH is significant foratrial flutter s/p ablation, T2DM, ESRD, hyperlipidemia, anemia, CHF, GERD, polysubstance abuseandhomelessness.  Enterococcus endocarditis Patienthas been compliant with his treatmentthusfar. He recognizes that he may be in the hospital for an extended period.CVTS noted that he is not a good surgical candidate due to his significant comorbidities and current social situation. They advised following up in clinic following completion of his antibiotic course. -Nephrologyfollowing,patient stable for discharge from renal standpoint -Infectious disease sign off, 7/3, will f/u with patient 7/26 -ContinueLinezolidPOday19/28 -Continue IV daptomycin day19/42,consult pharmacy for dosing with HD -weekly labs: CBC w/ diff, BMP, CK -Continue to follow-up on repeat blood cultures from after his line was pulled -Hgb today 7.0, continue to monitor CBC  Scrotal swelling Patient complained of mild pain, swelling has improved  with decreased tenderness.USand CT pelvisof scrotum revealed diffuse scrotal wall edema present. Although patient has a history of ESRD, since he is not having any output, Dr. Johnney Ou (nephrologist) agreed that it was appropriate to get imaging with contrast to rule out fournier gangrene. CT pelvis w/ contrast demonstratedearly vascular necrosis of the left femoral head, diffuse nonspecific scrotal wall edema, ascitesand enlarged inguinal and pelvic lymph nodes. My suspicion is that patient may be experiencing referred pain to his groin in addition to fluid overload which may be contributing to his scrotal edema. Patient had a RUQ [place] in March 2021 showing cirrhosis and paracentesis in April.RUQ USon 7/17 demonstrated poorly distended gallbladder with diffuse thickening and changes of cirrhosis of the liver with an associated small to moderate amount of ascites.Hepatic panel showed non reactive to both Hepatitis B surface antigen and HCV antibody. Albumin 2.4 and alkaline phosphatase 308. GGT 425. CT abdomen demonstrated cirrhotic changes, large volume abdominal ascites and diffuse body wall edema suggesting anasarca, small bilateral pleural effusions with overlying left basilar atelectasis and age advanced vascular calcifications. Ammonia slightly elevated at 36. -Continue to ice and elevate as needed  -Continue dialysis, 4L removed from last HD session -Monitor I/Os, continue fluid restriction -Continuenystatin -Consulted GI for concern of cirrhosis, awaiting AFP levels, paracentesis can be performed as needed. Awaiting alpha 1 antitrypsin. Continue to follow GI recs.  -wean off 4L nasal canula    Infected dialysis catheter Following a line holiday, and new leftnontunneledIJ catheter was placed on 7/2.New IJ tunneled catheter placed7/7, patient had bleeding after the surgery. IR sutured with dissolvable sutures at the bedside, no f/u needed. -Continue to monitor catheter site -Back on  TTS schedule for dialysis -Tramadol twice daily as needed for pain -Flexeril 5mg  q 8hrs PRN for muscle pain -Continue to monitor CBCs  Right shoulder pain Patient right sided shoulder pain most likely resolved, he denies any pain associated with it today. -Gabapentin100 mg -  Voltaren gel, tylenol, flexeril as needed for pain -Continue PT -Continue to monitor -May consider imaging if worsening symptoms -Per PT note, patient motivated to continue PT and enjoying therapeutic exercises  -Include PT recommendation of outpatient physical therapy 3 times a week in discharge summary    Right chest/neck pain Stableand resolved.Patient noticedsome right chest/neck pain since his admission. Patientpain appears to be managed well with voltaren gel.His chest pain has not bothered him for a few daysand denies chest pain today.We will continue to monitor as X ray has a low sensitivity for osteomyelitis and therefore it cannot be completly ruled out. CT could also be used to detect septic joint. PT recommends outpatient PT, 3x/ week. -Continue to monitor - Flexeril 5mg  q 8hrs PRN for likely muscle pain - May consider CT of shoulder if pain continues  - Voltaren gel 4x daily scheduled - Continue PT for shoulder  -Include PT recommendation of outpatient physical therapy 3 times a week in discharge summary  Right forearm swelling Stable.No swelling noted, likely resolved.Possible thrombus in the right upper extremityhas been ruled out.  -VENOUSVAS US of the upper extremities found no evidence of DVT in the UE, no evidence of superficial vein thrombosis, no evidence of thrombosis in the subclavian. - Will continue to monitor -ContinueEliquis   Leftbicep pain Likely resolved.Patienthas previouslyreportedthat he began having pain in hismedial,left bicep after procedurebut denies experiencing any pain currently. The painat the time wasconstant, but is worse with  movement. The pain follows a cord-like pattern. The patient also reports that he feels more short of breath after his surgery.But denies both dyspnea and left biceps pain. -Performed venous US of L upper extremitywhich demonstrated no evidence of DVT or superficial vein thrombosis in the left upper extremity.  Type 2 diabetes Most recentHgb A1c11.5 on 06/09/2020. Blood glucose 261. -ContinueLantus -Continue to monitor blood glucose levels and adjust as needed -SSI moderate -At bedtime coverage -Continue gabapentin for diabetic neuropathy -Ordered prostheticsfor bilateral lower extremityper PT recs -Consider encouraging patient to follow-up with PCP regarding proper diabetes management within the outpatient setting  Hypertension Patient'sBPtoday122/77. He denies chest painanddyspnea. -Continue amlodipine5mg and hydralazine 100 mgtid daily -Continue to monitor vitals including BP  Atrial flutter s/p ablation Patient's home medication include amiodarone and apixaban -Continueamiodarone -ContinueEliquis -Continuous cardiac monitoring  Hyperlipidemia -Held Atorvastatin 40 mg -CK 339 wnl but increasing on 7/19, likely in the event of daptomycin. Awaiting repeat CK -Consider discussion with patient on importance of relative measures by incorporating appropriate lifestyle modifications including healthy diet and adequate physical activity to work in conjunction with current pharmacologic therapy. -repeat CK tomorrow   Bipolar disorder Patient currently denies any symptoms including racing thoughts, mood is appropriate. -Abilify 5 mg daily  CHF -Continue to monitor fluid status -Dialysis TTS -Continue to monitor I/Os  GERD -Patient denies any reflux and epigastric pain at this time. -ContinueProtonix  Polysubstance abuse We will ensure that he is accompaniedwhenhe is allotted time outside. Nurse found patient ingesting "chalk." Bag was still in  the room, most of the bag was in [place], but it was determined that the chalk was actually clay, which the patient confirmed. Patient reportedthat this is a cultural remedy for ingesting Vitamins. Patient was under the impression that the chalk was from [facility] where his family is from and had previously participated in the practice of ingesting chalk for nutrients. The bag has been removed from the patient's room and placed with his possessions.  - Continue to monitor patient  Hx ofHomelessness Per  chart review patient has history of homelessness. Patient reports that heisnot homeless at this time and that he is moving back to [facility] soon. Patient does acknowledge that he missed his dialysis appointment because his ride to the dialysis appointment fell through. -Social work consulted for confirmation that patient has stable living situation  Buttocks wound Wound is continuing to heal well. Patient denies any complaints at this time. Patient denies any associated pain.RN is educating patient on wound hygiene and prevention of transferring bacteria into his wounds. Counseled patient on importance of moving positions to prevent decubitus ulcer formation.  -We will continue to monitor for any changes insymptoms   FEN/GI: renal carb modified  PPx: Eliquis  Disposition: Inpatient   Subjective:  Patient seen and examined at bedside. He admits to occasional dyspnea but denies chest pain, nausea, vomiting, dizziness, dysuria and right shoulder pain. Admits to some scrotal pain. Encouraged patient to continue to be motivated to do therapeutic exercises per PT. When I walked in, patient on 4L nasal canula. He states it is for comfort, nurse states that they tried to wean him down 2L but he refused. I got him to remove it while I was in the room performing my examination. After patient having to move positions multiple times, he seemed fine satting around 97 but then ended up asking for it right  before I left. He states that he uses oxygen in 20 minute increments but I asked him to see if he can increase that time to longer intervals, he agreed.   Objective: Temp:  [97.5 F (36.4 C)-98.5 F (36.9 C)] 98.5 F (36.9 C) (07/20 1018) Pulse Rate:  [83-90] 86 (07/20 1018) Resp:  [14-18] 14 (07/20 1018) BP: (122-141)/(77-91) 132/89 (07/20 1018) SpO2:  [93 %-100 %] 99 % (07/20 1018) Weight:  [135.9 kg-140.4 kg] 135.9 kg (07/19 2130) Physical Exam: General: Patient in no acute distress Cardiovascular: regular rate and rhythm, no murmurs or gallops noted  Respiratory: lungs clear to auscultation bilaterally, no increased work of breathing noted  Abdomen: nontender on palpation, distended, active bowel sounds  Extremities: knee amputation from below the knee bilaterally, FADIR test elicited pain, negative FABER Psych: no agitation noted GU: scrotal edema noted  Laboratory: Recent Labs  Lab 06/29/20 2021 07/01/20 0031 07/02/20 0627  WBC 8.3 8.2 7.7  HGB 6.8* 7.3* 7.0*  HCT 22.8* 24.8* 23.9*  PLT 224 200 197   Recent Labs  Lab 06/28/20 1025 06/28/20 1025 06/29/20 2251 07/01/20 0031 07/02/20 0627  NA 135   < > 137 135 133*  K 4.2   < > 3.5 3.7 4.0  CL 98   < > 98 99 97*  CO2 25   < > 25 23 23   BUN 45*   < > 24* 37* 49*  CREATININE 6.34*   < > 4.38* 5.84* 7.38*  CALCIUM 8.5*   < > 8.6* 8.6* 8.7*  PROT 7.7  --   --   --   --   BILITOT 0.5  --   --   --   --   ALKPHOS 308*  --   --   --   --   ALT 8  --   --   --   --   AST 22  --   --   --   --   GLUCOSE 138*   < > 250* 202* 261*   < > = values in this interval not displayed.  Imaging/Diagnostic Tests: CT ABDOMEN W CONTRAST  Result Date: 07/02/2020 CLINICAL DATA:  Abdominal distension. Assess cirrhosis and portal hypertension. Chronic renal failure. EXAM: CT ABDOMEN WITH CONTRAST TECHNIQUE: Multidetector CT imaging of the abdomen was performed using the standard protocol following bolus administration of  intravenous contrast. CONTRAST:  162mL OMNIPAQUE IOHEXOL 300 MG/ML  SOLN COMPARISON:  CT scan 06/27/2020 FINDINGS: Examination is limited by body habitus. Lower chest: Small bilateral pleural effusions with overlying left basilar atelectasis. The heart is upper limits of normal in size. Coronary artery calcifications are noted. Hepatobiliary: Very heterogeneous appearance of the liver probably geographic fatty infiltration. Cirrhotic changes are suggested with irregular liver contour and increased caudate to right lobe ratio. The portal and hepatic veins are patent. No obvious hepatic lesions. Gallbladder is mildly contracted. Suspect gallbladder sludge. No intra or extrahepatic biliary dilatation. Pancreas: No mass, inflammation or ductal dilatation. Spleen: Normal size. No focal lesions. Adrenals/Urinary Tract: The adrenal glands are unremarkable. The kidneys are small but no renal calculi or mass. Stomach/Bowel: The stomach, duodenum, visualized small bowel and visualized colon are grossly normal. The bowel is centralized due to large volume abdominal ascites. Vascular/Lymphatic: The aorta is normal in caliber. Age advanced vascular calcifications. Prominent upper abdominal lymph nodes, typical with cirrhosis. Other: Large volume abdominal ascites. There is also diffuse body wall edema suggesting anasarca. Musculoskeletal: No significant bony findings. IMPRESSION: 1. Very heterogeneous appearance of the liver probably geographic fatty infiltration. 2. Cirrhotic changes but no obvious hepatic lesions. 3. Large volume abdominal ascites and diffuse body wall edema suggesting anasarca. 4. Small bilateral pleural effusions with overlying left basilar atelectasis. 5. Age advanced vascular calcifications. Electronically Signed   By: Marijo Sanes M.D.   On: 07/02/2020 08:21    Donney Dice, DO 07/02/2020, 2:42 PM PGY-1, [place] Intern pager: [phone], text pages welcome

## 2020-07-02 NOTE — Progress Notes (Signed)
Patient ID: Lucas [name], male   DOB: 02-02-1971, 49 y.o.   MRN: [id number] S:   Patient seen in room and on dialysis. Feels much better in regards to breathing after receiving an extra session of dialysis yesterday.   O:BP 132/89   Pulse 86   Temp 98.5 F (36.9 C) (Oral)   Resp 14   Ht 6\' 4"  (1.93 m)   Wt 135.9 kg   SpO2 99%   BMI 36.47 kg/m   Intake/Output Summary (Last 24 hours) at 07/02/2020 1314 Last data filed at 07/02/2020 0601 Gross per 24 hour  Intake 520 ml  Output 3000 ml  Net -2480 ml   Intake/Output: I/O last 3 completed shifts: In: 7824 [P.O.:1420; Blood:390] Out: 3000 [Other:3000]    Intake/Output this shift:  No intake/output data recorded. Weight change:  Gen: nad CVS: no rub, s1s2, rrr Resp: diminished air entry bibasilar (improved), normal wob, unlabored, bl chest expansion Abd: distended Ext: s/p bilateral BKAs with trace stump edema GU: scrotal swelling L IJ TDC bandaged  Recent Labs  Lab 06/26/20 1529 06/27/20 0458 06/28/20 1025 06/29/20 2251 07/01/20 0031 07/02/20 0627  NA 133* 132* 135 137 135 133*  K 4.4 4.8 4.2 3.5 3.7 4.0  CL 93* 94* 98 98 99 97*  CO2 25 20* 25 25 23 23   GLUCOSE 153* 184* 138* 250* 202* 261*  BUN 55* 66* 45* 24* 37* 49*  CREATININE 7.04* 7.63* 6.34* 4.38* 5.84* 7.38*  ALBUMIN  --   --  2.4*  --   --   --   CALCIUM 8.9 8.9 8.5* 8.6* 8.6* 8.7*  AST  --   --  22  --   --   --   ALT  --   --  8  --   --   --    Liver Function Tests: Recent Labs  Lab 06/28/20 1025  AST 22  ALT 8  ALKPHOS 308*  BILITOT 0.5  PROT 7.7  ALBUMIN 2.4*   No results for input(s): LIPASE, AMYLASE in the last 168 hours. Recent Labs  Lab 07/02/20 0627  AMMONIA 36*   CBC: Recent Labs  Lab 06/27/20 0458 06/27/20 0458 06/28/20 1025 06/28/20 1025 06/29/20 2021 07/01/20 0031 07/02/20 0627  WBC 10.1   < > 8.3   < > 8.3 8.2 7.7  NEUTROABS  --   --   --   --   --  6.1  --   HGB 7.0*   < > 7.0*   < > 6.8* 7.3* 7.0*  HCT 22.9*   < >  23.5*   < > 22.8* 24.8* 23.9*  MCV 93.1  --  94.8  --  95.8 94.7 96.0  PLT 212   < > 197   < > 224 200 197   < > = values in this interval not displayed.   Cardiac Enzymes: Recent Labs  Lab 07/01/20 0031  CKTOTAL 339   CBG: Recent Labs  Lab 07/01/20 1624 07/01/20 2031 07/01/20 2254 07/02/20 0634 07/02/20 0636  GLUCAP 106* 140* 138* 241* 229*    Iron Studies:  No results for input(s): IRON, TIBC, TRANSFERRIN, FERRITIN in the last 72 hours. Studies/Results: CT ABDOMEN W CONTRAST  Result Date: 07/02/2020 CLINICAL DATA:  Abdominal distension. Assess cirrhosis and portal hypertension. Chronic renal failure. EXAM: CT ABDOMEN WITH CONTRAST TECHNIQUE: Multidetector CT imaging of the abdomen was performed using the standard protocol following bolus administration of intravenous contrast. CONTRAST:  16mL OMNIPAQUE IOHEXOL 300 MG/ML  SOLN COMPARISON:  CT scan 06/27/2020 FINDINGS: Examination is limited by body habitus. Lower chest: Small bilateral pleural effusions with overlying left basilar atelectasis. The heart is upper limits of normal in size. Coronary artery calcifications are noted. Hepatobiliary: Very heterogeneous appearance of the liver probably geographic fatty infiltration. Cirrhotic changes are suggested with irregular liver contour and increased caudate to right lobe ratio. The portal and hepatic veins are patent. No obvious hepatic lesions. Gallbladder is mildly contracted. Suspect gallbladder sludge. No intra or extrahepatic biliary dilatation. Pancreas: No mass, inflammation or ductal dilatation. Spleen: Normal size. No focal lesions. Adrenals/Urinary Tract: The adrenal glands are unremarkable. The kidneys are small but no renal calculi or mass. Stomach/Bowel: The stomach, duodenum, visualized small bowel and visualized colon are grossly normal. The bowel is centralized due to large volume abdominal ascites. Vascular/Lymphatic: The aorta is normal in caliber. Age advanced vascular  calcifications. Prominent upper abdominal lymph nodes, typical with cirrhosis. Other: Large volume abdominal ascites. There is also diffuse body wall edema suggesting anasarca. Musculoskeletal: No significant bony findings. IMPRESSION: 1. Very heterogeneous appearance of the liver probably geographic fatty infiltration. 2. Cirrhotic changes but no obvious hepatic lesions. 3. Large volume abdominal ascites and diffuse body wall edema suggesting anasarca. 4. Small bilateral pleural effusions with overlying left basilar atelectasis. 5. Age advanced vascular calcifications. Electronically Signed   By: Marijo Sanes M.D.   On: 07/02/2020 08:21   . sodium chloride   Intravenous Once  . sodium chloride   Intravenous Once  . acetaminophen  1,000 mg Oral Q6H WA  . amiodarone  200 mg Oral Daily  . amLODipine  5 mg Oral Daily  . apixaban  5 mg Oral BID  . ARIPiprazole  5 mg Oral Daily  . carvedilol  12.5 mg Oral BID  . Chlorhexidine Gluconate Cloth  6 each Topical Q0600  . Chlorhexidine Gluconate Cloth  6 each Topical Q0600  . [START ON 07/06/2020] darbepoetin (ARANESP) injection - DIALYSIS  200 mcg Intravenous Q Sat-HD  . diclofenac Sodium  4 g Topical QID  . doxercalciferol  4 mcg Oral Q T,Th,Sa-HD  . gabapentin  100 mg Oral QHS  . hydrALAZINE  100 mg Oral TID  . insulin aspart  0-15 Units Subcutaneous TID WC  . insulin aspart  0-5 Units Subcutaneous QHS  . insulin glargine  20 Units Subcutaneous Daily  . lidocaine  1 patch Transdermal Q24H  . linezolid  600 mg Oral Q12H  . melatonin  5 mg Oral QHS  . nystatin   Topical BID  . nystatin cream   Topical BID  . pantoprazole  40 mg Oral Daily  . polyethylene glycol  17 g Oral Daily  . QUEtiapine  100 mg Oral QHS  . sevelamer carbonate  1,600 mg Oral TID with meals  . sodium chloride flush  10-40 mL Intracatheter Q12H  . tamsulosin  0.4 mg Oral Daily    BMET    Component Value Date/Time   NA 133 (L) 07/02/2020 0627   K 4.0 07/02/2020 0627   CL  97 (L) 07/02/2020 0627   CO2 23 07/02/2020 0627   GLUCOSE 261 (H) 07/02/2020 0627   BUN 49 (H) 07/02/2020 0627   CREATININE 7.38 (H) 07/02/2020 0627   CALCIUM 8.7 (L) 07/02/2020 0627   GFRNONAA 8 (L) 07/02/2020 0627   GFRAA 9 (L) 07/02/2020 0627   CBC    Component Value Date/Time   WBC 7.7 07/02/2020 0627   RBC 2.49 (L) 07/02/2020 8850  HGB 7.0 (L) 07/02/2020 0627   HCT 23.9 (L) 07/02/2020 0627   PLT 197 07/02/2020 0627   MCV 96.0 07/02/2020 0627   MCH 28.1 07/02/2020 0627   MCHC 29.3 (L) 07/02/2020 0627   RDW 18.2 (H) 07/02/2020 0627   LYMPHSABS 0.7 07/01/2020 0031   MONOABS 1.1 (H) 07/01/2020 0031   EOSABS 0.2 07/01/2020 0031   BASOSABS 0.0 07/01/2020 0031   Outpatient: [facility] in [place] [facility]) Phone [phone] Scheduled TTS 4 hours His outpatient unit reports noncompliance BF 400; DF 800 F250 dialyzer  2/2.5 ca bath EDW 250 lbs Last weight 272.2 lbs on 6/24 (signed off early and had skipped the treatment prior) aranesp 120 mcg weekly  hectoral 4 mcg each tx  Assessment/Plan:  1. Enterococcus bacteremia/Mitral valve Endocarditis- due to tunneled HD catheter. TDC removed 6/29 s/p temp HD catheter placed 06/14/20.Now with new LIJ [place] placed 7/6. Will continue withIVcubicin andoralzyvoxper ID 1. Not candidate for surgery due to multiple co-morbidities, social issues, and polysubstance abuse 2. Noted vegetation on TTE 6/29 3. daptomycin through 07/22/20 and oral linezolid for 4 weeks (his home HD unit does have daptomycin). Check ck with dapto. 4. Follow up with ID on 07/08/20 2. ESRDcontinue with TTS schedule; catheter replaced by IR on 06/18/20. 1. TTS schedule here - Usual HD on schedule:  4h, 4-5L UF, 2K. Tight heparin. LIJ TDC.  2. Resume TTS schedule, will monitor for extra treatments if needed 3. Needs to dialyze in HD chair. Reiterated this importance of this to the patient and explained that this will only hold up his  discharge 4. Discussed fluid restriction with patient 3. Anemia:due to ESRD. Continue with ESA. increased dose, will be receiving max dose qweekly. Transfusion per primary. ASx currently.  7/9 TSAT 16% and ferritin 561 holding IV Fe w/ #1 4. CKD-MBD:continue with home meds. Monitor phos. 5. Nutrition:renal diet 6. Hypertension:BPs srtable, CTM 7. Vascular access-s/pL IJ tunneled HD catheter per IR7/6/21. 8. Disposition- SW to assist with transportation to his outpatient unit.  Gean Quint, MD [facility]

## 2020-07-02 NOTE — Progress Notes (Signed)
Notified by the NT that patient verbally cursed her "bitch", pulled her arm and screamed at her to get out of the room while she was trying to help him with his breakfast.

## 2020-07-03 ENCOUNTER — Inpatient Hospital Stay (HOSPITAL_COMMUNITY): Payer: Medicaid Other

## 2020-07-03 DIAGNOSIS — K7469 Other cirrhosis of liver: Secondary | ICD-10-CM

## 2020-07-03 HISTORY — PX: IR PARACENTESIS: IMG2679

## 2020-07-03 LAB — BASIC METABOLIC PANEL
Anion gap: 12 (ref 5–15)
BUN: 40 mg/dL — ABNORMAL HIGH (ref 6–20)
CO2: 26 mmol/L (ref 22–32)
Calcium: 8.7 mg/dL — ABNORMAL LOW (ref 8.9–10.3)
Chloride: 96 mmol/L — ABNORMAL LOW (ref 98–111)
Creatinine, Ser: 5.8 mg/dL — ABNORMAL HIGH (ref 0.61–1.24)
GFR calc Af Amer: 12 mL/min — ABNORMAL LOW (ref >60)
GFR calc non Af Amer: 11 mL/min — ABNORMAL LOW (ref >60)
Glucose, Bld: 180 mg/dL — ABNORMAL HIGH (ref 70–99)
Potassium: 3.6 mmol/L (ref 3.5–5.1)
Sodium: 134 mmol/L — ABNORMAL LOW (ref 135–145)

## 2020-07-03 LAB — CBC
HCT: 25.1 % — ABNORMAL LOW (ref 39.0–52.0)
Hemoglobin: 7.5 g/dL — ABNORMAL LOW (ref 13.0–17.0)
MCH: 28.2 pg (ref 26.0–34.0)
MCHC: 29.9 g/dL — ABNORMAL LOW (ref 30.0–36.0)
MCV: 94.4 fL (ref 80.0–100.0)
Platelets: 208 10*3/uL (ref 150–400)
RBC: 2.66 MIL/uL — ABNORMAL LOW (ref 4.22–5.81)
RDW: 18.3 % — ABNORMAL HIGH (ref 11.5–15.5)
WBC: 9.2 10*3/uL (ref 4.0–10.5)
nRBC: 0.2 % (ref 0.0–0.2)

## 2020-07-03 LAB — BODY FLUID CELL COUNT WITH DIFFERENTIAL
Lymphs, Fluid: 10 %
Monocyte-Macrophage-Serous Fluid: 42 % — ABNORMAL LOW (ref 50–90)
Neutrophil Count, Fluid: 48 % — ABNORMAL HIGH (ref 0–25)
Total Nucleated Cell Count, Fluid: 425 cu mm (ref 0–1000)

## 2020-07-03 LAB — CERULOPLASMIN: Ceruloplasmin: 34 mg/dL — ABNORMAL HIGH (ref 16.0–31.0)

## 2020-07-03 LAB — ALPHA-1-ANTITRYPSIN: A-1 Antitrypsin, Ser: 236 mg/dL — ABNORMAL HIGH (ref 101–187)

## 2020-07-03 LAB — ANTI-SMOOTH MUSCLE ANTIBODY, IGG: F-Actin IgG: 23 Units — ABNORMAL HIGH (ref 0–19)

## 2020-07-03 LAB — PROTEIN, PLEURAL OR PERITONEAL FLUID: Total protein, fluid: 5 g/dL

## 2020-07-03 LAB — CK: Total CK: 295 U/L (ref 49–397)

## 2020-07-03 LAB — ANTINUCLEAR ANTIBODIES, IFA: ANA Ab, IFA: NEGATIVE

## 2020-07-03 LAB — ALBUMIN, PLEURAL OR PERITONEAL FLUID: Albumin, Fluid: 1.8 g/dL

## 2020-07-03 LAB — GLUCOSE, CAPILLARY
Glucose-Capillary: 178 mg/dL — ABNORMAL HIGH (ref 70–99)
Glucose-Capillary: 159 mg/dL — ABNORMAL HIGH (ref 70–99)
Glucose-Capillary: 181 mg/dL — ABNORMAL HIGH (ref 70–99)
Glucose-Capillary: 228 mg/dL — ABNORMAL HIGH (ref 70–99)

## 2020-07-03 LAB — IGA: IgA: 632 mg/dL — ABNORMAL HIGH (ref 90–386)

## 2020-07-03 LAB — MITOCHONDRIAL ANTIBODIES: Mitochondrial M2 Ab, IgG: <20 Units (ref 0.0–20.0)

## 2020-07-03 LAB — AFP TUMOR MARKER: AFP, Serum, Tumor Marker: 0.9 ng/mL (ref 0.0–8.3)

## 2020-07-03 LAB — TISSUE TRANSGLUTAMINASE, IGA: Tissue Transglutaminase Ab, IgA: <2 U/mL (ref 0–3)

## 2020-07-03 LAB — IGG: IgG (Immunoglobin G), Serum: 2076 mg/dL — ABNORMAL HIGH (ref 603–1613)

## 2020-07-03 MED ORDER — LIDOCAINE HCL 1 % IJ SOLN
INTRAMUSCULAR | Status: DC | PRN
  Administered 2020-07-03: 10 mL

## 2020-07-03 MED ORDER — LIDOCAINE HCL 1 % IJ SOLN
INTRAMUSCULAR | Status: AC
  Filled 2020-07-03: qty 20

## 2020-07-03 NOTE — Plan of Care (Signed)
  Problem: Pain Managment: Goal: General experience of comfort will improve Outcome: Progressing   Problem: Safety: Goal: Ability to remain free from injury will improve Outcome: Progressing   

## 2020-07-03 NOTE — Progress Notes (Addendum)
Family Medicine Teaching Service Daily Progress Note Intern Pager: [phone]  Patient name: Lucas [name] Medical record number: [id number] Date of birth: 12/02/1971 Age: 49 y.o. Gender: male  Primary Care Provider: System, Pcp Not In Consultants: Nephrology, cardiology, infectious disease, gastroenterology  Code Status: Full   Pt Overview and Major Events to Date:  Admitted for hyperglycemia and port pain on 06/09/2020 Enterococcus grew on blood cultures on 06/10/2020 TTE demonstrated mitral valve vegetation on 06/11/2020 Removal of right IJ HD catheter on 06/11/2020 TEE completed on 06/13/2020 Patient received left IJ nontunneled, restarted HD on 06/14/2020 Patient received long-term tunneled left IJ on 06/19/2020  Antibiotics Patient's current antibiotics and duration listed below. Gentamicin 6/29-6/30 Vancomycin 6/28-6/29 Linezolid 7/1-7/28 Daptomycin 7/1-8/9  Assessment and Plan: Lucas Alfordis a 49 y.o.malewith Enterococcus endocarditis. PMH is significant foratrial flutter s/p ablation, T2DM, ESRD, hyperlipidemia, anemia, CHF, GERD, polysubstance abuseandhomelessness.  Enterococcus endocarditis Patienthas been compliant with his treatmentthusfar. He recognizes that he may be in the hospital for an extended period.CVTS noted that he is not a good surgical candidate due to his significant comorbidities and current social situation. They advised following up in clinic following completion of his antibiotic course. -Nephrologyfollowing,patient stable for discharge from renal standpoint -Infectious disease sign off, 7/3, will f/u with patient 7/26 -ContinueLinezolidPOday20/28 -Continue IV daptomycin day20/42,consult pharmacy for dosing with HD -weekly labs: CBC w/ diff, BMP, CK -Continue to follow-up on repeat blood cultures from after his line was pulled -Hgb today 7.5, continue to monitor CBC  Scrotal swelling Patient complained of mild pain 7/10, swelling has  improved with decreased tenderness.USand CT pelvisof scrotum revealed diffuse scrotal wall edema present. Although patient has a history of ESRD, since he is not having any output, Dr. Johnney Ou (nephrologist) agreed that it was appropriate to get imaging with contrast to rule out fournier gangrene. CT pelvis w/ contrast demonstratedearly vascular necrosis of the left femoral head, diffuse nonspecific scrotal wall edema, ascitesand enlarged inguinal and pelvic lymph nodes. My suspicion is that patient may be experiencing referred pain to his groin in addition to fluid overload which may be contributing to his scrotal edema. Patient had a RUQ [place] in March 2021 showing cirrhosis and paracentesis in April.RUQ USon 7/17 demonstrated poorly distended gallbladder with diffuse thickening and changes of cirrhosis of the liver with an associated small to moderate amount of ascites.Hepatic panel showed non reactive to both Hepatitis B surface antigen and HCV antibody. Albumin 2.4 and alkaline phosphatase 308.GGT 425. CT abdomen demonstrated cirrhotic changes, large volume abdominal ascites and diffuse body wall edema suggesting anasarca, small bilateral pleural effusions with overlying left basilar atelectasis and age advanced vascular calcifications. Ammonia slightly elevated at 36. -Continue to ice and elevate as needed  -Continue dialysis, last HD session 7/20 -Monitor I/Os, continue fluid restriction -Continuenystatin -Consulted GI for concern of cirrhosis. Recommended to remove 1-2L for comfort. Paracentesis recommended and performed, yielded 3L of peritoneal fluid. AFP 0.9 wnl. ANA negative. Elevated anti-smooth muscle antibody 23. Elevated alpha antitrypsin 236. Elevated ceruloplasmin 34. Elevated IgA 632 and IgG 2076. Awaiting protein, albumin and cytology. -continue to wean off nasal canula, currently on 3L and used it 4 times yesterday. Has been trying to use it less and with exertion. Still also uses  for comfort.    Infected dialysis catheter Following a line holiday, and new leftnontunneledIJ catheter was placed on 7/2.New IJ tunneled catheter placed7/7, patient had bleeding after the surgery. IR sutured with dissolvable sutures at the bedside, no f/u needed. -Continue to monitor catheter  site -Back on TTS schedule for dialysis -Tramadol twice daily as needed for pain -Flexeril 5mg  q 8hrs PRN for muscle pain -Continue to monitor CBCs  Right shoulder pain Patient experiencing right shoulder pain likely secondary to osteoarthritis noted on x-ray. Exhibits full active ROM with slight tenderness on the posterior aspect of the shoulder. -Gabapentin100 mg -Discussed using voltaren gel for the affected area since that has been successful initially  -Tylenol, flexeril as needed for pain -Continue PT -Continue to monitor -May consider imaging if worsening symptoms -Per PT note, patient motivated to continue PT and enjoying therapeutic exercises  -Include PT recommendation of outpatient physical therapy 3 times a week in discharge summary    Right chest/neck pain Stableand resolved.Patient noticedsome right chest/neck pain since his admission. Patientpain appears to be managed well with voltaren gel.His chest pain has not bothered him for a few daysand denies chest pain today.We will continue to monitor as X ray has a low sensitivity for osteomyelitis and therefore it cannot be completly ruled out. CT could also be used to detect septic joint. PT recommends outpatient PT, 3x/ week. -Continue to monitor - Flexeril 5mg  q 8hrs PRN for likely muscle pain - May consider CT of shoulder if pain continues  - Voltaren gel 4x daily scheduled - Continue PT for shoulder  -Include PT recommendation of outpatient physical therapy 3 times a week in discharge summary  Right forearm swelling Stable.No swelling noted, likely resolved.Possible thrombus in the right upper  extremityhas been ruled out.  -VENOUSVAS US of the upper extremities found no evidence of DVT in the UE, no evidence of superficial vein thrombosis, no evidence of thrombosis in the subclavian. - Will continue to monitor -ContinueEliquis   Leftbicep pain Likely resolved.Patienthas previouslyreportedthat he began having pain in hismedial,left bicep after procedurebut denies experiencing any pain currently. The painat the time wasconstant, but is worse with movement. The pain follows a cord-like pattern. The patient also reports that he feels more short of breath after his surgery.But denies both dyspnea and left biceps pain. -Performed venous US of L upper extremitywhich demonstrated no evidence of DVT or superficial vein thrombosis in the left upper extremity.  Type 2 diabetes Most recentHgb A1c11.5 on 06/09/2020.CBG 178. Blood glucose 261>180. -ContinueLantus -Continue to monitor blood glucose levels and adjust as needed -SSI moderate -At bedtime coverage -Continue gabapentin for diabetic neuropathy -Ordered prostheticsfor bilateral lower extremityper PT recs -Consider encouraging patient to follow-up with PCP regarding proper diabetes management within the outpatient setting  Hypertension Patient'sBPtoday139/88. He denies chest painanddyspnea. -Continue amlodipine5mg and hydralazine 100 mgtid daily -Continue to monitor vitals including BP  Atrial flutter s/p ablation Patient's home medication include amiodarone and apixaban -Continueamiodarone -ContinueEliquis -Continuous cardiac monitoring  Hyperlipidemia -Continue to holdAtorvastatin 40 mg -CK 339 wnlbut increasing on 7/19,likely in the event of daptomycin. Repeat CK 295 7/21 -Consider discussion with patient on importance of relative measures by incorporating appropriate lifestyle modifications including healthy diet and adequate physical activity to work in conjunction with  current pharmacologic therapy.   Bipolar disorder Patient currently denies any symptoms including racing thoughts, mood is appropriate. -Abilify 5 mg daily  CHF -Continue to monitor fluid status -Dialysis TTS -Continue to monitor I/Os  GERD -Patient denies any reflux and epigastric pain at this time. -ContinueProtonix  Polysubstance abuse We will ensure that he is accompaniedwhenhe is allotted time outside. Nurse found patient ingesting "chalk." Bag was still in the room, most of the bag was in [place], but it was determined that the chalk  was actually clay, which the patient confirmed. Patient reportedthat this is a cultural remedy for ingesting Vitamins. Patient was under the impression that the chalk was from [facility] where his family is from and had previously participated in the practice of ingesting chalk for nutrients. The bag has been removed from the patient's room and placed with his possessions.  - Continue to monitor patient  Hx ofHomelessness Per chart review patient has history of homelessness. Patient reports that heisnot homeless at this time and that he is moving back to [facility] soon. Patient does acknowledge that he missed his dialysis appointment because his ride to the dialysis appointment fell through. -request for wheelchair placed -Social work consulted for confirmation that patient has stable living situation and appropriate DME at discharge   Buttocks wound Wound is continuing to heal well. Patient denies any complaints at this time. Patient denies any associated pain.RN is educating patient on wound hygiene and prevention of transferring bacteria into his wounds. Counseled patient on importance of moving positions to prevent decubitus ulcer formation.  -We will continue to monitor for any changes insymptoms  FEN/GI: renal carb modified  PPx: Eliquis   Disposition: Inpatient   Subjective:  Patient seen and examined at bedside. Scrotum  continuing to improve, rates pain 7/10 today. Admits to right shoulder pain, instructed to try to apply voltaren gel which successfully provided relief in the past. Right shoulder exhibits full, active ROM. He denies chest pain, nausea and vomiting but admits to occasional dyspnea. Satting on 98% O2 on 3L nasal canula which he used 4 times yesterday after we discussed trying to gradually wean off. Patient states that he would like a wheelchair so that he can try to be more active than just laying in the bed.   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 Psych: no agitation noted GU: scrotal edema improved, buttocks wound looks improved just as yesterday    Laboratory: Recent Labs  Lab 07/01/20 0031 07/02/20 0627 07/03/20 0609  WBC 8.2 7.7 9.2  HGB 7.3* 7.0* 7.5*  HCT 24.8* 23.9* 25.1*  PLT 200 197 208   Recent Labs  Lab 06/28/20 1025 06/29/20 2251 07/01/20 0031 07/02/20 0627 07/03/20 0609  NA 135   < > 135 133* 134*  K 4.2   < > 3.7 4.0 3.6  CL 98   < > 99 97* 96*  CO2 25   < > 23 23 26   BUN 45*   < > 37* 49* 40*  CREATININE 6.34*   < > 5.84* 7.38* 5.80*  CALCIUM 8.5*   < > 8.6* 8.7* 8.7*  PROT 7.7  --   --   --   --   BILITOT 0.5  --   --   --   --   ALKPHOS 308*  --   --   --   --   ALT 8  --   --   --   --   AST 22  --   --   --   --  GLUCOSE 138*   < > 202* 261* 180*   < > = values in this interval not displayed.      Imaging/Diagnostic Tests: IR  Paracentesis  Result Date: 07/03/2020 INDICATION: Patient with history of cirrhosis, abdominal distension, and ascites. Request is made for diagnostic and therapeutic paracentesis up to 3 L. EXAM: ULTRASOUND GUIDED DIAGNOSTIC AND THERAPEUTIC PARACENTESIS MEDICATIONS: 10 mL 1% lidocaine COMPLICATIONS: None immediate. PROCEDURE: Informed written consent was obtained from the patient after a discussion of the risks, benefits and alternatives to treatment. A timeout was performed prior to the initiation of the procedure. Initial ultrasound scanning demonstrates a large amount of ascites within the right lower abdominal quadrant. The right lower abdomen was prepped and draped in the usual sterile fashion. 1% lidocaine was used for local anesthesia. Following this, a 19 gauge, 10-cm, Yueh catheter was introduced. An ultrasound image was saved for documentation purposes. The paracentesis was performed. The catheter was removed and a dressing was applied. The patient tolerated the procedure well without immediate post procedural complication. FINDINGS: A total of approximately 3 L of hazy gold fluid was removed. Samples were sent to the laboratory as requested by the clinical team. IMPRESSION: Successful ultrasound-guided paracentesis yielding 3 L of peritoneal fluid. Read by: Earley Abide, PA-C Electronically Signed   By: Sandi Mariscal M.D.   On: 07/03/2020 15:36    Donney Dice, DO 07/03/2020, 3:58 PM PGY-1, [place] Intern pager: [phone], text pages welcome

## 2020-07-03 NOTE — Procedures (Signed)
PROCEDURE SUMMARY:  Successful image-guided paracentesis from the right lateral abdomen.  Yielded 3.0 liters of hazy gold fluid.  No immediate complications.  EBL = 0 mL. Patient tolerated well.   Specimen was sent for labs.  Please see imaging section of Epic for full dictation.   Claris Pong Lashena Signer PA-C 07/03/2020 3:16 PM

## 2020-07-03 NOTE — Progress Notes (Signed)
Patient ID: Lucas [name], male   DOB: 1971/04/14, 49 y.o.   MRN: [id number] S:   Wanted to sign off early from dialysis yesterday. Tolerated treatment. Net uf 3L, no complications. Complaints today  O:BP 120/72 (BP Location: Left Arm)   Pulse 72   Temp 97.7 F (36.5 C) (Oral)   Resp 18   Ht 6\' 4"  (1.93 m)   Wt (!) 136.8 kg   SpO2 98%   BMI 36.71 kg/m   Intake/Output Summary (Last 24 hours) at 07/03/2020 1816 Last data filed at 07/03/2020 1300 Gross per 24 hour  Intake 856 ml  Output 0 ml  Net 856 ml   Intake/Output: I/O last 3 completed shifts: In: 116 [IV Piggyback:116] Out: 6000 [Other:6000]    Intake/Output this shift:  Total I/O In: 740 [P.O.:740] Out: 0  Weight change: -0.6 kg Gen: nad CVS: no rub, s1s2, rrr Resp: cta bl, normal wob, unlabored, bl chest expansion Abd: distended Ext: s/p bilateral BKAs with trace stump edema GU: scrotal swelling L IJ TDC bandaged  Recent Labs  Lab 06/27/20 0458 06/28/20 1025 06/29/20 2251 07/01/20 0031 07/02/20 0627 07/03/20 0609  NA 132* 135 137 135 133* 134*  K 4.8 4.2 3.5 3.7 4.0 3.6  CL 94* 98 98 99 97* 96*  CO2 20* 25 25 23 23 26   GLUCOSE 184* 138* 250* 202* 261* 180*  BUN 66* 45* 24* 37* 49* 40*  CREATININE 7.63* 6.34* 4.38* 5.84* 7.38* 5.80*  ALBUMIN  --  2.4*  --   --   --   --   CALCIUM 8.9 8.5* 8.6* 8.6* 8.7* 8.7*  AST  --  22  --   --   --   --   ALT  --  8  --   --   --   --    Liver Function Tests: Recent Labs  Lab 06/28/20 1025  AST 22  ALT 8  ALKPHOS 308*  BILITOT 0.5  PROT 7.7  ALBUMIN 2.4*   No results for input(s): LIPASE, AMYLASE in the last 168 hours. Recent Labs  Lab 07/02/20 0627  AMMONIA 36*   CBC: Recent Labs  Lab 06/28/20 1025 06/28/20 1025 06/29/20 2021 06/29/20 2021 07/01/20 0031 07/02/20 0627 07/03/20 0609  WBC 8.3   < > 8.3   < > 8.2 7.7 9.2  NEUTROABS  --   --   --   --  6.1  --   --   HGB 7.0*   < > 6.8*   < > 7.3* 7.0* 7.5*  HCT 23.5*   < > 22.8*   < > 24.8*  23.9* 25.1*  MCV 94.8  --  95.8  --  94.7 96.0 94.4  PLT 197   < > 224   < > 200 197 208   < > = values in this interval not displayed.   Cardiac Enzymes: Recent Labs  Lab 07/01/20 0031 07/03/20 0609  CKTOTAL 339 295   CBG: Recent Labs  Lab 07/02/20 1801 07/02/20 2102 07/03/20 0641 07/03/20 1101 07/03/20 1645  GLUCAP 185* 231* 178* 159* 181*    Iron Studies:  No results for input(s): IRON, TIBC, TRANSFERRIN, FERRITIN in the last 72 hours. Studies/Results: CT ABDOMEN W CONTRAST  Result Date: 07/02/2020 CLINICAL DATA:  Abdominal distension. Assess cirrhosis and portal hypertension. Chronic renal failure. EXAM: CT ABDOMEN WITH CONTRAST TECHNIQUE: Multidetector CT imaging of the abdomen was performed using the standard protocol following bolus administration of intravenous contrast. CONTRAST:  11mL  OMNIPAQUE IOHEXOL 300 MG/ML  SOLN COMPARISON:  CT scan 06/27/2020 FINDINGS: Examination is limited by body habitus. Lower chest: Small bilateral pleural effusions with overlying left basilar atelectasis. The heart is upper limits of normal in size. Coronary artery calcifications are noted. Hepatobiliary: Very heterogeneous appearance of the liver probably geographic fatty infiltration. Cirrhotic changes are suggested with irregular liver contour and increased caudate to right lobe ratio. The portal and hepatic veins are patent. No obvious hepatic lesions. Gallbladder is mildly contracted. Suspect gallbladder sludge. No intra or extrahepatic biliary dilatation. Pancreas: No mass, inflammation or ductal dilatation. Spleen: Normal size. No focal lesions. Adrenals/Urinary Tract: The adrenal glands are unremarkable. The kidneys are small but no renal calculi or mass. Stomach/Bowel: The stomach, duodenum, visualized small bowel and visualized colon are grossly normal. The bowel is centralized due to large volume abdominal ascites. Vascular/Lymphatic: The aorta is normal in caliber. Age advanced vascular  calcifications. Prominent upper abdominal lymph nodes, typical with cirrhosis. Other: Large volume abdominal ascites. There is also diffuse body wall edema suggesting anasarca. Musculoskeletal: No significant bony findings. IMPRESSION: 1. Very heterogeneous appearance of the liver probably geographic fatty infiltration. 2. Cirrhotic changes but no obvious hepatic lesions. 3. Large volume abdominal ascites and diffuse body wall edema suggesting anasarca. 4. Small bilateral pleural effusions with overlying left basilar atelectasis. 5. Age advanced vascular calcifications. Electronically Signed   By: Marijo Sanes M.D.   On: 07/02/2020 08:21   IR Paracentesis  Result Date: 07/03/2020 INDICATION: Patient with history of cirrhosis, abdominal distension, and ascites. Request is made for diagnostic and therapeutic paracentesis up to 3 L. EXAM: ULTRASOUND GUIDED DIAGNOSTIC AND THERAPEUTIC PARACENTESIS MEDICATIONS: 10 mL 1% lidocaine COMPLICATIONS: None immediate. PROCEDURE: Informed written consent was obtained from the patient after a discussion of the risks, benefits and alternatives to treatment. A timeout was performed prior to the initiation of the procedure. Initial ultrasound scanning demonstrates a large amount of ascites within the right lower abdominal quadrant. The right lower abdomen was prepped and draped in the usual sterile fashion. 1% lidocaine was used for local anesthesia. Following this, a 19 gauge, 10-cm, Yueh catheter was introduced. An ultrasound image was saved for documentation purposes. The paracentesis was performed. The catheter was removed and a dressing was applied. The patient tolerated the procedure well without immediate post procedural complication. FINDINGS: A total of approximately 3 L of hazy gold fluid was removed. Samples were sent to the laboratory as requested by the clinical team. IMPRESSION: Successful ultrasound-guided paracentesis yielding 3 L of peritoneal fluid. Read by:  Earley Abide, PA-C Electronically Signed   By: Sandi Mariscal M.D.   On: 07/03/2020 15:36   . sodium chloride   Intravenous Once  . sodium chloride   Intravenous Once  . acetaminophen  1,000 mg Oral Q6H WA  . amiodarone  200 mg Oral Daily  . amLODipine  5 mg Oral Daily  . apixaban  5 mg Oral BID  . ARIPiprazole  5 mg Oral Daily  . carvedilol  12.5 mg Oral BID  . Chlorhexidine Gluconate Cloth  6 each Topical Q0600  . Chlorhexidine Gluconate Cloth  6 each Topical Q0600  . [START ON 07/06/2020] darbepoetin (ARANESP) injection - DIALYSIS  200 mcg Intravenous Q Sat-HD  . diclofenac Sodium  4 g Topical QID  . doxercalciferol  4 mcg Oral Q T,Th,Sa-HD  . gabapentin  100 mg Oral QHS  . hydrALAZINE  100 mg Oral TID  . insulin aspart  0-15 Units Subcutaneous TID WC  .  insulin aspart  0-5 Units Subcutaneous QHS  . insulin glargine  20 Units Subcutaneous Daily  . lidocaine  1 patch Transdermal Q24H  . lidocaine      . linezolid  600 mg Oral Q12H  . melatonin  5 mg Oral QHS  . nystatin   Topical BID  . nystatin cream   Topical BID  . pantoprazole  40 mg Oral Daily  . polyethylene glycol  17 g Oral Daily  . QUEtiapine  100 mg Oral QHS  . sevelamer carbonate  1,600 mg Oral TID with meals  . sodium chloride flush  10-40 mL Intracatheter Q12H  . tamsulosin  0.4 mg Oral Daily    BMET    Component Value Date/Time   NA 134 (L) 07/03/2020 0609   K 3.6 07/03/2020 0609   CL 96 (L) 07/03/2020 0609   CO2 26 07/03/2020 0609   GLUCOSE 180 (H) 07/03/2020 0609   BUN 40 (H) 07/03/2020 0609   CREATININE 5.80 (H) 07/03/2020 0609   CALCIUM 8.7 (L) 07/03/2020 0609   GFRNONAA 11 (L) 07/03/2020 0609   GFRAA 12 (L) 07/03/2020 0609   CBC    Component Value Date/Time   WBC 9.2 07/03/2020 0609   RBC 2.66 (L) 07/03/2020 0609   HGB 7.5 (L) 07/03/2020 0609   HCT 25.1 (L) 07/03/2020 0609   PLT 208 07/03/2020 0609   MCV 94.4 07/03/2020 0609   MCH 28.2 07/03/2020 0609   MCHC 29.9 (L) 07/03/2020 0609   RDW  18.3 (H) 07/03/2020 0609   LYMPHSABS 0.7 07/01/2020 0031   MONOABS 1.1 (H) 07/01/2020 0031   EOSABS 0.2 07/01/2020 0031   BASOSABS 0.0 07/01/2020 0031   Outpatient: [facility] [place] [facility]) Phone [phone] Scheduled TTS 4 hours His outpatient unit reports noncompliance BF 400; DF 800 F250 dialyzer  2/2.5 ca bath EDW 250 lbs Last weight 272.2 lbs on 6/24 (signed off early and had skipped the treatment prior) aranesp 120 mcg weekly  hectoral 4 mcg each tx  Assessment/Plan:  1. Enterococcus bacteremia/Mitral valve Endocarditis- due to tunneled HD catheter. TDC removed 6/29 s/p temp HD catheter placed 06/14/20.Now with new [facility] [place] placed 7/6. Will continue withIVcubicin andoralzyvoxper ID 1. Not candidate for surgery due to multiple co-morbidities, social issues, and polysubstance abuse 2. Noted vegetation on TTE 6/29 3. daptomycin through 07/22/20 and oral linezolid for 4 weeks (his home HD unit does have daptomycin). Check ck with dapto. 4. Follow up with ID on 07/08/20 2. ESRDcontinue with TTS schedule; catheter replaced by IR on 06/18/20. 1. TTS schedule here - Usual HD on schedule:  4h, 4-5L UF, 2K. Tight heparin. [facility]J TDC.  2. Resume TTS schedule, will monitor for extra treatments if needed but no indication now 3. Needs to dialyze in HD chair. Reiterated this importance of this to the patient and explained that this will only hold up his discharge 4. Discussed fluid restriction with patient 3. Anemia:due to ESRD. Continue with ESA. increased dose, will be receiving max dose qweekly. Transfusion per primary. ASx currently.  7/9 TSAT 16% and ferritin 561 holding IV Fe w/ #1 4. CKD-MBD:continue with home meds. Monitor phos. 5. Nutrition:renal diet 6. Hypertension:BPs srtable, CTM 7. Vascular access-s/pL IJ tunneled HD catheter per IR7/6/21. 8. Disposition- SW to assist with transportation to his outpatient unit.  Gean Quint, MD [facility]

## 2020-07-03 NOTE — Progress Notes (Signed)
Notified Ortho tech to [facility] regarding [place].

## 2020-07-03 NOTE — Progress Notes (Addendum)
Daily Rounding Note  07/03/2020, 10:03 AM  LOS: 24 days   SUBJECTIVE:   Chief complaint: cirrhosis of liver and ascites    No abd pain, no nausea.  Eating well.  Stools brown  OBJECTIVE:         Vital signs in last 24 hours:    Temp:  [97.8 F (36.6 C)-99 F (37.2 C)] 98.5 F (36.9 C) (07/21 0923) Pulse Rate:  [72-97] 84 (07/21 0923) Resp:  [14-20] 18 (07/21 0923) BP: (101-139)/(47-94) 111/85 (07/21 0923) SpO2:  [95 %-100 %] 96 % (07/21 0923) Weight:  [136.8 kg-139.8 kg] 136.8 kg (07/20 1553) Last BM Date: 06/28/20 Filed Weights   07/01/20 2130 07/02/20 1141 07/02/20 1553  Weight: 135.9 kg (!) 139.8 kg (!) 136.8 kg   General: obese, comfortable.  Chronically ill looking   Mouth is very dry and is affecting his speech.   Heart: RRR Chest: no labored breathing Abdomen: tense, obese, protuberant.  Active BS.  NT  Extremities: BKA bil Neuro/Psych:  Oriented x 3.  Appropriate.  No tremor or asterixis.  Moves all 4s.  Cooperative, calm.     Intake/Output from previous day: 07/20 0701 - 07/21 0700 In: 116 [IV Piggyback:116] Out: 3000   Intake/Output this shift: Total I/O In: 220 [P.O.:220] Out: 0   Lab Results: Recent Labs    07/01/20 0031 07/02/20 0627 07/03/20 0609  WBC 8.2 7.7 9.2  HGB 7.3* 7.0* 7.5*  HCT 24.8* 23.9* 25.1*  PLT 200 197 208   BMET Recent Labs    07/01/20 0031 07/02/20 0627 07/03/20 0609  NA 135 133* 134*  K 3.7 4.0 3.6  CL 99 97* 96*  CO2 23 23 26   GLUCOSE 202* 261* 180*  BUN 37* 49* 40*  CREATININE 5.84* 7.38* 5.80*  CALCIUM 8.6* 8.7* 8.7*   LFT No results for input(s): PROT, ALBUMIN, AST, ALT, ALKPHOS, BILITOT, BILIDIR, IBILI in the last 72 hours. PT/INR No results for input(s): LABPROT, INR in the last 72 hours. Hepatitis Panel No results for input(s): HEPBSAG, HCVAB, HEPAIGM, HEPBIGM in the last 72 hours.  Studies/Results: CT ABDOMEN W CONTRAST  Result Date:  07/02/2020 CLINICAL DATA:  Abdominal distension. Assess cirrhosis and portal hypertension. Chronic renal failure. EXAM: CT ABDOMEN WITH CONTRAST TECHNIQUE: Multidetector CT imaging of the abdomen was performed using the standard protocol following bolus administration of intravenous contrast. CONTRAST:  156mL OMNIPAQUE IOHEXOL 300 MG/ML  SOLN COMPARISON:  CT scan 06/27/2020 FINDINGS: Examination is limited by body habitus. Lower chest: Small bilateral pleural effusions with overlying left basilar atelectasis. The heart is upper limits of normal in size. Coronary artery calcifications are noted. Hepatobiliary: Very heterogeneous appearance of the liver probably geographic fatty infiltration. Cirrhotic changes are suggested with irregular liver contour and increased caudate to right lobe ratio. The portal and hepatic veins are patent. No obvious hepatic lesions. Gallbladder is mildly contracted. Suspect gallbladder sludge. No intra or extrahepatic biliary dilatation. Pancreas: No mass, inflammation or ductal dilatation. Spleen: Normal size. No focal lesions. Adrenals/Urinary Tract: The adrenal glands are unremarkable. The kidneys are small but no renal calculi or mass. Stomach/Bowel: The stomach, duodenum, visualized small bowel and visualized colon are grossly normal. The bowel is centralized due to large volume abdominal ascites. Vascular/Lymphatic: The aorta is normal in caliber. Age advanced vascular calcifications. Prominent upper abdominal lymph nodes, typical with cirrhosis. Other: Large volume abdominal ascites. There is also diffuse body wall edema suggesting anasarca. Musculoskeletal: No significant bony  findings. IMPRESSION: 1. Very heterogeneous appearance of the liver probably geographic fatty infiltration. 2. Cirrhotic changes but no obvious hepatic lesions. 3. Large volume abdominal ascites and diffuse body wall edema suggesting anasarca. 4. Small bilateral pleural effusions with overlying left basilar  atelectasis. 5. Age advanced vascular calcifications. Electronically Signed   By: Marijo Sanes M.D.   On: 07/02/2020 08:21   Scheduled Meds: . sodium chloride   Intravenous Once  . sodium chloride   Intravenous Once  . acetaminophen  1,000 mg Oral Q6H WA  . amiodarone  200 mg Oral Daily  . amLODipine  5 mg Oral Daily  . apixaban  5 mg Oral BID  . ARIPiprazole  5 mg Oral Daily  . carvedilol  12.5 mg Oral BID  . Chlorhexidine Gluconate Cloth  6 each Topical Q0600  . Chlorhexidine Gluconate Cloth  6 each Topical Q0600  . [START ON 07/06/2020] darbepoetin (ARANESP) injection - DIALYSIS  200 mcg Intravenous Q Sat-HD  . diclofenac Sodium  4 g Topical QID  . doxercalciferol  4 mcg Oral Q T,Th,Sa-HD  . gabapentin  100 mg Oral QHS  . hydrALAZINE  100 mg Oral TID  . insulin aspart  0-15 Units Subcutaneous TID WC  . insulin aspart  0-5 Units Subcutaneous QHS  . insulin glargine  20 Units Subcutaneous Daily  . lidocaine  1 patch Transdermal Q24H  . linezolid  600 mg Oral Q12H  . melatonin  5 mg Oral QHS  . nystatin   Topical BID  . nystatin cream   Topical BID  . pantoprazole  40 mg Oral Daily  . polyethylene glycol  17 g Oral Daily  . QUEtiapine  100 mg Oral QHS  . sevelamer carbonate  1,600 mg Oral TID with meals  . sodium chloride flush  10-40 mL Intracatheter Q12H  . tamsulosin  0.4 mg Oral Daily   Continuous Infusions: . DAPTOmycin (CUBICIN)  IV 1,000 mg (06/29/20 2125)  . DAPTOmycin (CUBICIN)  IV 800 mg (07/02/20 2042)   PRN Meds:.cyclobenzaprine, oxyCODONE, sodium chloride flush  ASSESMENT:   *   Cirrhosis with large volume ascites, observed on ultrasound, CT.  Anasarca per CT. AFP 0.9.  Acute Viral serologies negative.  Multiple pndg tests to r/o metabollic and autoimmune causes of liver dz.    *    MV endocarditis, Enterococcus bacteremia. Cubicin in place.    *   ESRD on HD  *    Anemia of chronic disease, no suggestion of GI bleed. Hgb 6.8 >> 2 PRBC >> 7.5.     Platelets 208 On Aranesp.    *    A flutter, status post ablation.  Chronic Abixiban.  *   bil BKAs.     PLAN   *   Needs paracentesis with diagnostic studies: albumin, cell count diff.  Same day should get serum albumin drawn to allow SAAG calculation.  Since this is TS pt, leave ordering to the residents.  SEE BELOW  Azucena Freed  07/03/2020, 10:03 AM Phone [phone]  GI ATTENDING  Interval history data reviewed.  Patient seen and examined.  Agree with interval progress note as outlined above.  Multiple studies for chronic liver disease pending.  RECOMMEND: For the [place] 1.  Order paracentesis 2.  Remove 1 to 2 L for comfort 3.  Send fluid for protein, albumin, cell count with differential, cytology. 4.  Obtain blood work with serum albumin and protein same day as paracentesis 5.  Calculate SAAG to provide a differential diagnosis for ascites.  Docia Chuck. Geri Seminole., M.D. [facility] Division of Gastroenterology

## 2020-07-03 NOTE — Progress Notes (Signed)
Physical Therapy Note   Initially sound asleep at my first visit;   Came back to room to check on pt's exercises; provided him with written out program for more therapeutic exercises for preparation for prostheses;   He will look over the exercises, and will answer any questions he has in further PT sessions;   Discussed his case with Trinda Pascal, [facility] with [facility] in [place]; she told me she spoke with the prosthetist from the [facility]; relayed to me that Mr. Cuevas did not consistently go to his prosthetics appointments, that he hadn't been there in over a year, and that they do not have his prosthesis for the LLE (pt told this PT that his L prosthesis is waiting for him at [facility] in [place]);   Consulting for fabrication and fit of prosthesis(es) can be reattempted Outpatient;   Meghan, CPO did indicate that they can provide ampusheilds to help prevent knee flexion contractures; put in verbal order for ampusheilds per Dr. Ardelia Mems;   Mr. Godbee chances of reaching his goal to be mobile with prostheses will depend on his follow through with therapeutic exercise, shrinker wearing (recommend he at least sleep in the shrinkers), and his consistency with prosthetics appointments.  Roney Marion, [place]  Acute Rehabilitation Services Pager [phone] Office [phone]

## 2020-07-03 NOTE — Progress Notes (Signed)
Inpatient Diabetes Program Recommendations  AACE/ADA: New Consensus Statement on Inpatient Glycemic Control (2015)  Target Ranges:  Prepandial:   less than 140 mg/dL      Peak postprandial:   less than 180 mg/dL (1-2 hours)      Critically ill patients:  140 - 180 mg/dL   Lab Results  Component Value Date   GLUCAP 178 (H) 07/03/2020   HGBA1C 11.5 (H) 06/09/2020    Review of Glycemic Control Results for Gillermo, Poch Cylis (MRN [id number]) as of 07/03/2020 09:18  Ref. Range 07/02/2020 06:36 07/02/2020 12:45 07/02/2020 18:01 07/02/2020 21:02 07/03/2020 06:41  Glucose-Capillary Latest Ref Range: 70 - 99 mg/dL 229 (H) 214 (H) 185 (H) 231 (H) 178 (H)   Diabetes history: DM 2 Outpatient Diabetes medications: lantus 20, Humalog 2-12 units tid Current orders for Inpatient glycemic control:  Lantus 20 units Daily Novolog 0-15 units tid + hs  Inpatient Diabetes Program Recommendations:  -Increase Lantus to 25 units - Reduce Novolog Correction to "sensitive" due to renal function 0-9 units tid + hs.  Thanks, Tama Headings RN, MSN, BC-ADM Inpatient Diabetes Coordinator Team Pager [phone] (8a-5p)

## 2020-07-04 DIAGNOSIS — R932 Abnormal findings on diagnostic imaging of liver and biliary tract: Secondary | ICD-10-CM

## 2020-07-04 LAB — CBC
HCT: 24.7 % — ABNORMAL LOW (ref 39.0–52.0)
Hemoglobin: 7.4 g/dL — ABNORMAL LOW (ref 13.0–17.0)
MCH: 28.4 pg (ref 26.0–34.0)
MCHC: 30 g/dL (ref 30.0–36.0)
MCV: 94.6 fL (ref 80.0–100.0)
Platelets: 215 10*3/uL (ref 150–400)
RBC: 2.61 MIL/uL — ABNORMAL LOW (ref 4.22–5.81)
RDW: 18.2 % — ABNORMAL HIGH (ref 11.5–15.5)
WBC: 8.2 10*3/uL (ref 4.0–10.5)
nRBC: 0 % (ref 0.0–0.2)

## 2020-07-04 LAB — BASIC METABOLIC PANEL
Anion gap: 14 (ref 5–15)
BUN: 52 mg/dL — ABNORMAL HIGH (ref 6–20)
CO2: 22 mmol/L (ref 22–32)
Calcium: 8.6 mg/dL — ABNORMAL LOW (ref 8.9–10.3)
Chloride: 98 mmol/L (ref 98–111)
Creatinine, Ser: 7.22 mg/dL — ABNORMAL HIGH (ref 0.61–1.24)
GFR calc Af Amer: 9 mL/min — ABNORMAL LOW (ref >60)
GFR calc non Af Amer: 8 mL/min — ABNORMAL LOW (ref >60)
Glucose, Bld: 181 mg/dL — ABNORMAL HIGH (ref 70–99)
Potassium: 3.9 mmol/L (ref 3.5–5.1)
Sodium: 134 mmol/L — ABNORMAL LOW (ref 135–145)

## 2020-07-04 LAB — GLUCOSE, CAPILLARY
Glucose-Capillary: 219 mg/dL — ABNORMAL HIGH (ref 70–99)
Glucose-Capillary: 179 mg/dL — ABNORMAL HIGH (ref 70–99)
Glucose-Capillary: 162 mg/dL — ABNORMAL HIGH (ref 70–99)
Glucose-Capillary: 227 mg/dL — ABNORMAL HIGH (ref 70–99)

## 2020-07-04 LAB — CK: Total CK: 276 U/L (ref 49–397)

## 2020-07-04 LAB — CYTOLOGY - NON PAP

## 2020-07-04 MED ORDER — SODIUM CHLORIDE 0.9 % IV SOLN: 100.0000 mL | INTRAVENOUS | Status: DC | PRN

## 2020-07-04 MED ORDER — HEPARIN SODIUM (PORCINE) 1000 UNIT/ML DIALYSIS: 1000.0000 [IU] | INTRAMUSCULAR | Status: DC | PRN

## 2020-07-04 MED ORDER — HEPARIN SODIUM (PORCINE) 1000 UNIT/ML IJ SOLN
INTRAMUSCULAR | Status: AC
  Administered 2020-07-04: 3800 [IU] via INTRAVENOUS_CENTRAL
  Filled 2020-07-04: qty 4

## 2020-07-04 MED ORDER — ALTEPLASE 2 MG IJ SOLR: 2.0000 mg | Freq: Once | INTRAMUSCULAR | Status: DC | PRN

## 2020-07-04 MED ORDER — DOXERCALCIFEROL 4 MCG/2ML IV SOLN
INTRAVENOUS | Status: AC
  Administered 2020-07-04: 4 ug
  Filled 2020-07-04: qty 2

## 2020-07-04 NOTE — Progress Notes (Signed)
Inpatient Diabetes Program Recommendations  AACE/ADA: New Consensus Statement on Inpatient Glycemic Control (2015)  Target Ranges:  Prepandial:   less than 140 mg/dL      Peak postprandial:   less than 180 mg/dL (1-2 hours)      Critically ill patients:  140 - 180 mg/dL   Lab Results  Component Value Date   GLUCAP 219 (H) 07/04/2020   HGBA1C 11.5 (H) 06/09/2020    Review of Glycemic Control Results for Lucas [name], Lucas [name] (MRN [id number]) as of 07/04/2020 11:39  Ref. Range 07/03/2020 06:41 07/03/2020 11:01 07/03/2020 16:45 07/03/2020 20:45 07/04/2020 06:41  Glucose-Capillary Latest Ref Range: 70 - 99 mg/dL 178 (H) 159 (H) 181 (H) 228 (H) 219 (H)    Diabetes history: DM 2 Outpatient Diabetes medications: lantus 20, Humalog 2-12 units tid Current orders for Inpatient glycemic control:  Lantus 20 units Daily Novolog 0-15 units tid + hs  Inpatient Diabetes Program Recommendations:  -Increase Lantus to 25 units - Reduce Novolog Correction to "sensitive" due to renal function 0-9 units tid + hs.  Thanks, Tama Headings RN, MSN, BC-ADM Inpatient Diabetes Coordinator Team Pager [phone] (8a-5p)

## 2020-07-04 NOTE — Progress Notes (Addendum)
Daily Rounding Note  07/04/2020, 11:35 AM  LOS: 25 days   SUBJECTIVE:   Chief complaint:  Cirrhosis, ascites.     Denies abdominal pain.  Tolerating solid food.  Wondering if he can discharge home  OBJECTIVE:         Vital signs in last 24 hours:    Temp:  [97.4 F (36.3 C)-98.1 F (36.7 C)] 97.9 F (36.6 C) (07/22 0703) Pulse Rate:  [71-87] 76 (07/22 1100) Resp:  [17-18] 17 (07/22 0703) BP: (107-151)/(42-104) 131/97 (07/22 1100) SpO2:  [98 %-100 %] 100 % (07/22 0703) Weight:  [136.5 kg-137.4 kg] 136.5 kg (07/22 0703) Last BM Date: 07/03/20 Filed Weights   07/02/20 1553 07/04/20 0447 07/04/20 0703  Weight: (!) 136.8 kg (!) 137.4 kg (!) 136.5 kg   General: obese, looks chronically unwell   Heart: irreg, irreg Chest: clear bil.   Abdomen: obese, NT, active BS  Extremities: bil BKA Neuro/Psych:  Oriented x 3.  Appropriate.  Moves all limbs.  Calm.  Fluid speech.    Intake/Output from previous day: 07/21 0701 - 07/22 0700 In: 980 [P.O.:980] Out: 0   Intake/Output this shift: No intake/output data recorded.  Lab Results: Recent Labs    07/02/20 0627 07/03/20 0609 07/04/20 0310  WBC 7.7 9.2 8.2  HGB 7.0* 7.5* 7.4*  HCT 23.9* 25.1* 24.7*  PLT 197 208 215   BMET Recent Labs    07/02/20 0627 07/03/20 0609 07/04/20 0310  NA 133* 134* 134*  K 4.0 3.6 3.9  CL 97* 96* 98  CO2 23 26 22   GLUCOSE 261* 180* 181*  BUN 49* 40* 52*  CREATININE 7.38* 5.80* 7.22*  CALCIUM 8.7* 8.7* 8.6*   LFT No results for input(s): PROT, ALBUMIN, AST, ALT, ALKPHOS, BILITOT, BILIDIR, IBILI in the last 72 hours. PT/INR No results for input(s): LABPROT, INR in the last 72 hours. Hepatitis Panel No results for input(s): HEPBSAG, HCVAB, HEPAIGM, HEPBIGM in the last 72 hours.  Studies/Results: IR Paracentesis  Result Date: 07/03/2020 INDICATION: Patient with history of cirrhosis, abdominal distension, and ascites. Request  is made for diagnostic and therapeutic paracentesis up to 3 L. EXAM: ULTRASOUND GUIDED DIAGNOSTIC AND THERAPEUTIC PARACENTESIS MEDICATIONS: 10 mL 1% lidocaine COMPLICATIONS: None immediate. PROCEDURE: Informed written consent was obtained from the patient after a discussion of the risks, benefits and alternatives to treatment. A timeout was performed prior to the initiation of the procedure. Initial ultrasound scanning demonstrates a large amount of ascites within the right lower abdominal quadrant. The right lower abdomen was prepped and draped in the usual sterile fashion. 1% lidocaine was used for local anesthesia. Following this, a 19 gauge, 10-cm, Yueh catheter was introduced. An ultrasound image was saved for documentation purposes. The paracentesis was performed. The catheter was removed and a dressing was applied. The patient tolerated the procedure well without immediate post procedural complication. FINDINGS: A total of approximately 3 L of hazy gold fluid was removed. Samples were sent to the laboratory as requested by the clinical team. IMPRESSION: Successful ultrasound-guided paracentesis yielding 3 L of peritoneal fluid. Read by: Earley Abide, PA-C Electronically Signed   By: Sandi Mariscal M.D.   On: 07/03/2020 15:36   Scheduled Meds: . sodium chloride   Intravenous Once  . sodium chloride   Intravenous Once  . acetaminophen  1,000 mg Oral Q6H WA  . amiodarone  200 mg Oral Daily  . amLODipine  5 mg Oral Daily  . apixaban  5 mg Oral BID  . ARIPiprazole  5 mg Oral Daily  . carvedilol  12.5 mg Oral BID  . Chlorhexidine Gluconate Cloth  6 each Topical Q0600  . Chlorhexidine Gluconate Cloth  6 each Topical Q0600  . [START ON 07/06/2020] darbepoetin (ARANESP) injection - DIALYSIS  200 mcg Intravenous Q Sat-HD  . diclofenac Sodium  4 g Topical QID  . doxercalciferol  4 mcg Oral Q T,Th,Sa-HD  . gabapentin  100 mg Oral QHS  . hydrALAZINE  100 mg Oral TID  . insulin aspart  0-15 Units  Subcutaneous TID WC  . insulin aspart  0-5 Units Subcutaneous QHS  . insulin glargine  20 Units Subcutaneous Daily  . lidocaine  1 patch Transdermal Q24H  . linezolid  600 mg Oral Q12H  . melatonin  5 mg Oral QHS  . nystatin   Topical BID  . nystatin cream   Topical BID  . pantoprazole  40 mg Oral Daily  . polyethylene glycol  17 g Oral Daily  . QUEtiapine  100 mg Oral QHS  . sevelamer carbonate  1,600 mg Oral TID with meals  . sodium chloride flush  10-40 mL Intracatheter Q12H  . tamsulosin  0.4 mg Oral Daily   Continuous Infusions: . DAPTOmycin (CUBICIN)  IV 1,000 mg (06/29/20 2125)  . DAPTOmycin (CUBICIN)  IV 800 mg (07/02/20 2042)   PRN Meds:.cyclobenzaprine, lidocaine, oxyCODONE, sodium chloride flush   ASSESMENT:   *   Cirrhosis with large volume ascites, observed on ultrasound, CT.  Anasarca per CT.  Hx polysubstance abuse, no hx ETOH abuse.      AFP 0.9.  Acute Viral serologies negative.  Elevated IgA, IgG Alpha 1 AT, Ceruloplasmin, smooth muscle Ab elevated.  Mitochondrial Ab, TTG wnl.   ANA negative.   Multiple pndg tests to r/o metabollic and autoimmune causes of liver dz.    *   Ascites.  S/p 3 L paracentesis 7/21. Nucleated cells total 425, neutrophils 48% + 204 polys, so not meeting criteria for SBP.    SAAG calculated using serum albumin of 2.4 from 7/16, and fluid albumin 1.8 on 7/21 so not necessarily accurate.  It is 0.6, not c/w portal htn.  .     *    MV endocarditis, Enterococcus bacteremia. Cubicin in place.    *   ESRD on HD. TTS HD.      *    Anemia of chronic disease, no suggestion of GI bleed. Hgb 6.8 >> 2 PRBC >> 7.5.   Platelets 208 On Aranesp.    *    A flutter. Chronic Abixiban.  *   bil BKAs.     *   Hx gastritis.  EGDs in 2018, 2019.  GI at [facility].     PLAN   *   May require periodic paracentesis prn as diuretics not an option for mgt of ascites.   Ascites can be monitored at HD sessions.     Azucena Freed  07/04/2020, 11:35  AM Phone [phone]  GI ATTENDING  Interval history data reviewed.  Agree with interval progress note as outlined.  No evidence for SBP on paracentesis.  No acute GI issues.  Agree that should he have issues with symptomatic ascites, that periodic paracentesis, for relief, would be recommended.  Obviously, no role for diuretics given his end-stage renal disease.  No particular need for outpatient GI follow-up, but we are happy to see him if needed.  Okay to go home from  GI perspective.  We will leave to primary service.  He should follow-up closely with his supervising physician in the outpatient teaching service clinic.  Docia Chuck. Geri Seminole., M.D. [facility] Division of Gastroenterology

## 2020-07-04 NOTE — Progress Notes (Signed)
This RN went to room to check on patient and noticed patient holding an Nurse, adult, also noticed  white powder around his mouth.  the same "chalk/dirt " his been eating that  we removed from previous week. Asked where he got it, patient said he ordered it from [place] and delivered to his room. he had it for a day. This RN removed the chalk/dirt, advised /educated patient about compliance to treatment and placed it in a bag and kept in the nurses station

## 2020-07-04 NOTE — Progress Notes (Signed)
Pharmacy Antibiotic Note  Lucas [name] is a 49 y.o. male admitted on 06/09/2020 with enterococcal bacteremia. Now found to have a mitral valve vegetation. Pharmacy has been consulted for Daptomycin dosing. Also on Zyvox for Gent-R Enterococcal MV IE.    The patient is ESRD - s/ptunneled HD catheter per IR7/6/21.  Pt is now on routine TTS dialysis schedule and tolerting well. Has received some extra sessions of ultrafiltration, no extra daptomycin is needed for these sessions. Adjusted body weight ~110 kg. CK was up trending, statin held,  Repeated CK improving.  Discussed with ID, patient does not have other good options for antibiotics.   CK improving, no change to abx dosing at this time.  Next CK ordered for Monday 7/26.  Plan: - Daptomycin 800 mg IV following HD Tues/Thur and 1000 mg following HD on Sat until 8/9  - Continue Zyvox 600 mg po every 12 hours until 7/28 - Will continue to monitor HD tolerance and schedule - F/u Q Monday CK  - next 7/26   Height: 6\' 4"  (193 cm) Weight: (!) 131.1 kg (289 lb) IBW/kg (Calculated) : 86.8  Temp (24hrs), Avg:97.7 F (36.5 C), Min:97.4 F (36.3 C), Max:98.1 F (36.7 C)  Recent Labs  Lab 06/28/20 1025 06/29/20 2021 06/29/20 2251 07/01/20 0031 07/02/20 0627 07/03/20 0609 07/04/20 0310  WBC  --  8.3  --  8.2 7.7 9.2 8.2  CREATININE   < >  --  4.38* 5.84* 7.38* 5.80* 7.22*   < > = values in this interval not displayed.    Estimated Creatinine Clearance: 18.5 mL/min (A) (by C-G formula based on SCr of 7.22 mg/dL (H)).    No Known Allergies   Linezolid 7/1>> (7/29) Dapto 7/1> (8/9) Vanc 6/28>>7/1 Gent 6/29>> 6/30  7/2 CK 66 7/5 CK 78 7/12 CK 169   7/19 CK 339 7/21 CK 295 7/22 CK 276  6/27 MRSA PCR + 6/27 BCx: Enterococcus faecalis (BCID Enterococcus) (S-amp, vanc) Gent synergy resistant 6/28 BCx: Enterococcus faecalis 6/30 BCx: ngtd   Thank you for allowing pharmacy to be a part of this patient's care.  [employer], Pharm.D., BCPS Clinical Pharmacist Clinical phone for 07/04/2020 from 8:30-4:00 is [phone].  **Pharmacist phone directory can be found on [email or web address] listed under [place].  07/04/2020 1:36 PM

## 2020-07-04 NOTE — Progress Notes (Signed)
Family Medicine Teaching Service Daily Progress Note Intern Pager: [phone]  Patient name: Lucas [name] Medical record number: [id number] Date of birth: [birth year] Age: 49 y.o. Gender: male  Primary Care Provider: System, Pcp Not In Consultants: Nephrology, cardiology, infectious disease, gastroenterology Code Status: Full  Pt Overview and Major Events to Date:  Admitted for hyperglycemia and port pain on 06/09/2020 Enterococcus grew on blood cultures on 06/10/2020 TTE demonstrated mitral valve vegetation on 06/11/2020 Removal of right IJ HD catheter on 06/11/2020 TEE completed on 06/13/2020 Patient received left IJ nontunneled, restarted HD on 06/14/2020 Patient received long-term tunneled left IJ on 06/19/2020  Antibiotics Patient's current antibiotics and duration listed below. Gentamicin 6/29-6/30 Vancomycin 6/28-6/29 Linezolid 7/1-7/28 Daptomycin 7/1-8/9  Assessment and Plan: Lucas Alfordis a 49 y.o.malewith Enterococcus endocarditis. PMH is significant foratrial flutter s/p ablation, T2DM, ESRD, hyperlipidemia, anemia, CHF, GERD, polysubstance abuseandhomelessness.  Enterococcus endocarditis Patienthas been compliant with his treatmentthusfar. He recognizes that he may be in the hospital for an extended period.CVTS noted that he is not a good surgical candidate due to his significant comorbidities and current social situation. They advised following up in clinic following completion of his antibiotic course. -Nephrologyfollowing,patient stable for discharge from renal standpoint -Infectious disease sign off, 7/3, will f/u with patient 7/26 -ContinueLinezolidPOday21/28 -Continue IV daptomycin day21/42 -weekly labs: CBC w/ diff, BMP, CK -Continue to follow-up on repeat blood cultures from after his line was pulled -Hgb today7.4, continue to monitor CBC. Consider transfusion if Hgb lower than 7.   Scrotal swelling Patient denied scrotal pain today and states  that it is improving. Patient seen in HD so did not examine scrotum today.USand CT pelvisof scrotum revealed diffuse scrotal wall edema present. Although patient has a history of ESRD, since he is not having any output, Dr. Johnney Ou (nephrologist) agreed that it was appropriate to get imaging with contrast to rule out fournier gangrene. CT pelvis w/ contrast demonstratedearly vascular necrosis of the left femoral head, diffuse nonspecific scrotal wall edema, ascitesand enlarged inguinal and pelvic lymph nodes. My suspicion is that patient may be experiencing referred pain to his groin in addition to fluid overload which may be contributing to his scrotal edema. Patient had a RUQ [place] in March 2021 showing cirrhosis and paracentesis in April.RUQ USon 7/17 demonstrated poorly distended gallbladder with diffuse thickening and changes of cirrhosis of the liver with an associated small to moderate amount of ascites.Hepatic panel showed non reactive to both Hepatitis B surface antigen and HCV antibody. Albumin 2.4 and alkaline phosphatase 308.GGT 425.CT abdomen demonstrated cirrhotic changes, large volume abdominal ascites and diffuse body wall edema suggesting anasarca, small bilateral pleural effusions with overlying left basilar atelectasis and age advanced vascular calcifications.Ammonia slightly elevated at 36. Consulted GI for concern of cirrhosis. Recommended to remove 1-2L for comfort. Paracentesis recommended and performed, yielded 3L of peritoneal fluid. AFP 0.9 wnl. ANA negative. Elevated anti-smooth muscle antibody 23. Elevated alpha antitrypsin 236. Elevated ceruloplasmin 34. Elevated IgA 632 and IgG 2076. Albumin wnl. -Continue to ice and elevateas needed -Continue dialysis -Monitor I/Os, continue fluid restriction -Continuenystatin -GI recommends paracentesis as needed, ascites monitored at HD  -continue to wean off nasal canula, currently on 2L and used it 2 times yesterday. Has been  trying to use it less and with exertion.     Infected dialysis catheter Following a line holiday, and new leftnontunneledIJ catheter was placed on 7/2.New IJ tunneled catheter placed7/7, patient had bleeding after the surgery. IR sutured with dissolvable sutures at the bedside, no f/u needed. -  Continue to monitor catheter site -Back on TTS schedule for dialysis -Tramadol twice daily as needed for pain -Flexeril 5mg  q 8hrs PRN for muscle pain -Continue to monitor CBCs  Right shoulder pain Patient right shoulder pain improving and likely secondary to osteoarthritis noted on x-ray. Exhibits full active ROM with slight tenderness on the posterior aspect of the shoulder. -Gabapentin100 mg -Discussed using voltaren gel for the affected area since that has been successful initially  -Tylenol, flexeril as needed for pain -Continue PT -Continue to monitor -May consider imaging if worsening symptoms -Per PT note, patient motivated to continue PT and enjoying therapeutic exercises -Include PT recommendation of outpatient physical therapy 3 times a week in discharge summary    Right chest/neck pain Stableand resolved.Patient noticedsome right chest/neck pain since his admission. Patientpain appears to be managed well with voltaren gel.His chest pain has not bothered him for a few daysand denies chest pain today.We will continue to monitor as X ray has a low sensitivity for osteomyelitis and therefore it cannot be completly ruled out. CT could also be used to detect septic joint. PT recommends outpatient PT, 3x/ week. -Continue to monitor - Flexeril 5mg  q 8hrs PRN for likely muscle pain - May consider CT of shoulder if pain continues  - Voltaren gel 4x daily scheduled - Continue PT for shoulder  -Include PT recommendation of outpatient physical therapy 3 times a week in discharge summary  Right forearm swelling Stable.No swelling noted, likely resolved.Possible  thrombus in the right upper extremityhas been ruled out.  -VENOUSVAS US of the upper extremities found no evidence of DVT in the UE, no evidence of superficial vein thrombosis, no evidence of thrombosis in the subclavian. - Will continue to monitor -ContinueEliquis   Leftbicep pain Likely resolved.Patienthas previouslyreportedthat he began having pain in hismedial,left bicep after procedurebut denies experiencing any pain currently. The painat the time wasconstant, but is worse with movement. The pain follows a cord-like pattern. The patient also reports that he feels more short of breath after his surgery.But denies both dyspnea and left biceps pain. -Performed venous US of L upper extremitywhich demonstrated no evidence of DVT or superficial vein thrombosis in the left upper extremity.  Type 2 diabetes Most recentHgb A1c11.5 on 06/09/2020.CBG 219. Blood glucose181. -ContinueLantus -Continue to monitor blood glucose levels and adjust as needed -SSI moderate -At bedtime coverage -Continue gabapentin for diabetic neuropathy -Ordered prostheticsfor bilateral lower extremityper PT recs -Consider encouraging patient to follow-up with PCP regarding proper diabetes management within the outpatient setting  Hypertension Patient'sBPtoday 32/84>141/89. He denies chest painanddyspnea. -Continue amlodipine5mg and hydralazine 100 mgtid daily -Continue to monitor vitals including BP  Atrial flutter s/p ablation Patient's home medication include amiodarone and apixaban -Continueamiodarone -ContinueEliquis -Continuous cardiac monitoring  Hyperlipidemia -Continue to holdAtorvastatin 40 mg -CK 339 wnlbut increasingon 7/19,likely in the event of daptomycin. Repeat CK 295 7/21 and CK 276 7/22. Continues to downtrend.  -Consider discussion with patient on importance of relative measures by incorporating appropriate lifestyle modifications including  healthy diet and adequate physical activity to work in conjunction with current pharmacologic therapy.   Bipolar disorder Patient currently denies any symptoms including racing thoughts, mood is appropriate. -Abilify 5 mg daily  CHF -Continue to monitor fluid status -Dialysis TTS -Continue to monitor I/Os  GERD -Patient denies any reflux and epigastric pain at this time. -ContinueProtonix  Polysubstance abuse We will ensure that he is accompaniedwhenhe is allotted time outside. Nurse found patient ingesting "chalk." Bag was still in the room, most of the  bag was in [place], but it was determined that the chalk was actually clay, which the patient confirmed. Patient reportedthat this is a cultural remedy for ingesting Vitamins. Patient was under the impression that the chalk was from [facility] where his family is from and had previously participated in the practice of ingesting chalk for nutrients. The bag has been removed from the patient's room and placed with his possessions. Per nurse note on 7/22 morning, patient found to be holding a white plastic bag and white powder around his month noted to be chalk that he had consumed previously during this hospitalization. Nurse educated patient on importance of compliance of treatment. - Continue to monitor patient  Hx ofHomelessness Per chart review patient has history of homelessness. Patient reports that heisnot homeless at this time and that he is moving back to [facility] soon. Patient does acknowledge that he missed his dialysis appointment because his ride to the dialysis appointment fell through. -request for wheelchair placed -Social work consulted for confirmation that patient has stable living situation and appropriate DME at discharge   Buttocks wound Wound is continuing to heal well.Patient denies any complaints at this time. Patient denies any associated pain.RN is educating patient on wound hygiene and prevention of  transferring bacteria into his wounds.Continue to counsel patient on importance of moving positions to prevent decubitus ulcer formation. -We will continue to monitor for any changes insymptoms  FEN/GI: renal carb modified  PPx: Eliquis   Disposition: Inpatient   Subjective:  Overnight events include that patient found with chalk around his mouth this morning, this has occurred before during this hospitalization. Patient seen and examined while in HD. He looked like he had just completed his breakfast and was sleeping when I came in. Denies any chest pain, dyspnea and any pain elsewhere. He states that both his scrotal pain and right shoulder pain have improved. He is now on 2L nasal canula and reports that he has been using it 2 times yesterday.   Objective: Temp:  [97.4 F (36.3 C)-98.5 F (36.9 C)] 98.1 F (36.7 C) (07/22 0447) Pulse Rate:  [72-87] 87 (07/22 0447) Resp:  [18] 18 (07/22 0447) BP: (107-139)/(72-104) 135/104 (07/22 0447) SpO2:  [96 %-100 %] 99 % (07/22 0447) Weight:  [137.4 kg] 137.4 kg (07/22 0447) Physical Exam: General: Patient resting comfortably in bed, in no acute distress. Cardiovascular: regular rate and rhythm, no murmurs or gallops noted  Respiratory: lungs clear to auscultation bilaterally Abdomen: distended, nontender, active bowel sounds  Extremities: amputation below the knee bilaterally   Laboratory: Recent Labs  Lab 07/02/20 0627 07/03/20 0609 07/04/20 0310  WBC 7.7 9.2 8.2  HGB 7.0* 7.5* 7.4*  HCT 23.9* 25.1* 24.7*  PLT 197 208 215   Recent Labs  Lab 06/28/20 1025 06/29/20 2251 07/02/20 0627 07/03/20 0609 07/04/20 0310  NA 135   < > 133* 134* 134*  K 4.2   < > 4.0 3.6 3.9  CL 98   < > 97* 96* 98  CO2 25   < > 23 26 22   BUN 45*   < > 49* 40* 52*  CREATININE 6.34*   < > 7.38* 5.80* 7.22*  CALCIUM 8.5*   < > 8.7* 8.7* 8.6*  PROT 7.7  --   --   --   --   BILITOT 0.5  --   --   --   --   ALKPHOS 308*  --   --   --   --  ALT 8   --   --   --   --   AST 22  --   --   --   --   GLUCOSE 138*   < > 261* 180* 181*   < > = values in this interval not displayed.      Imaging/Diagnostic Tests: IR Paracentesis  Result Date: 07/03/2020 INDICATION: Patient with history of cirrhosis, abdominal distension, and ascites. Request is made for diagnostic and therapeutic paracentesis up to 3 L. EXAM: ULTRASOUND GUIDED DIAGNOSTIC AND THERAPEUTIC PARACENTESIS MEDICATIONS: 10 mL 1% lidocaine COMPLICATIONS: None immediate. PROCEDURE: Informed written consent was obtained from the patient after a discussion of the risks, benefits and alternatives to treatment. A timeout was performed prior to the initiation of the procedure. Initial ultrasound scanning demonstrates a large amount of ascites within the right lower abdominal quadrant. The right lower abdomen was prepped and draped in the usual sterile fashion. 1% lidocaine was used for local anesthesia. Following this, a 19 gauge, 10-cm, Yueh catheter was introduced. An ultrasound image was saved for documentation purposes. The paracentesis was performed. The catheter was removed and a dressing was applied. The patient tolerated the procedure well without immediate post procedural complication. FINDINGS: A total of approximately 3 L of hazy gold fluid was removed. Samples were sent to the laboratory as requested by the clinical team. IMPRESSION: Successful ultrasound-guided paracentesis yielding 3 L of peritoneal fluid. Read by: Earley Abide, PA-C Electronically Signed   By: Sandi Mariscal M.D.   On: 07/03/2020 15:36    Donney Dice, DO 07/04/2020, 6:38 AM PGY-1, [place] Intern pager: [phone], text pages welcome

## 2020-07-04 NOTE — Progress Notes (Signed)
Patient ID: Lucas [name], male   DOB: May 28, 1971, 49 y.o.   MRN: [id number] S:   Patient seen on dialysis. Feels much better after paracentesis yesterday (3L removed with IR). Denies any complaints on hd today, tolerating tx.  Patient seen and examined on hemodialysis, appropriate changes made based on my evaluation of chart and patient.  O:BP (!) 135/104 (BP Location: Left Arm)   Pulse 87   Temp 98.1 F (36.7 C)   Resp 18   Ht 6\' 4"  (1.93 m)   Wt (!) 137.4 kg   SpO2 99%   BMI 36.88 kg/m   Intake/Output Summary (Last 24 hours) at 07/04/2020 0733 Last data filed at 07/04/2020 0447 Gross per 24 hour  Intake 980 ml  Output 0 ml  Net 980 ml   Intake/Output: I/O last 3 completed shifts: In: 6659 [P.O.:980; IV Piggyback:116] Out: 0     Intake/Output this shift:  No intake/output data recorded. Weight change: -2.36 kg Gen: nad, sleeping in comfortably, dialyzing in stretcher CVS: no rub, s1s2, rrr Resp: cta bl, normal wob, unlabored, bl chest expansion, speaking in full sentences Abd: distended (improved), softer now, nontender Ext: s/p bilateral BKAs with thigh edema (trace) GU: scrotal swelling L IJ TDC bandaged  Recent Labs  Lab 06/28/20 1025 06/29/20 2251 07/01/20 0031 07/02/20 0627 07/03/20 0609 07/04/20 0310  NA 135 137 135 133* 134* 134*  K 4.2 3.5 3.7 4.0 3.6 3.9  CL 98 98 99 97* 96* 98  CO2 25 25 23 23 26 22   GLUCOSE 138* 250* 202* 261* 180* 181*  BUN 45* 24* 37* 49* 40* 52*  CREATININE 6.34* 4.38* 5.84* 7.38* 5.80* 7.22*  ALBUMIN 2.4*  --   --   --   --   --   CALCIUM 8.5* 8.6* 8.6* 8.7* 8.7* 8.6*  AST 22  --   --   --   --   --   ALT 8  --   --   --   --   --    Liver Function Tests: Recent Labs  Lab 06/28/20 1025  AST 22  ALT 8  ALKPHOS 308*  BILITOT 0.5  PROT 7.7  ALBUMIN 2.4*   No results for input(s): LIPASE, AMYLASE in the last 168 hours. Recent Labs  Lab 07/02/20 0627  AMMONIA 36*   CBC: Recent Labs  Lab 06/29/20 2021  06/29/20 2021 07/01/20 0031 07/01/20 0031 07/02/20 0627 07/03/20 0609 07/04/20 0310  WBC 8.3   < > 8.2   < > 7.7 9.2 8.2  NEUTROABS  --   --  6.1  --   --   --   --   HGB 6.8*   < > 7.3*   < > 7.0* 7.5* 7.4*  HCT 22.8*   < > 24.8*   < > 23.9* 25.1* 24.7*  MCV 95.8  --  94.7  --  96.0 94.4 94.6  PLT 224   < > 200   < > 197 208 215   < > = values in this interval not displayed.   Cardiac Enzymes: Recent Labs  Lab 07/01/20 0031 07/03/20 0609 07/04/20 0310  CKTOTAL 339 295 276   CBG: Recent Labs  Lab 07/03/20 0641 07/03/20 1101 07/03/20 1645 07/03/20 2045 07/04/20 0641  GLUCAP 178* 159* 181* 228* 219*    Iron Studies:  No results for input(s): IRON, TIBC, TRANSFERRIN, FERRITIN in the last 72 hours. Studies/Results: IR Paracentesis  Result Date: 07/03/2020 INDICATION: Patient with history of  cirrhosis, abdominal distension, and ascites. Request is made for diagnostic and therapeutic paracentesis up to 3 L. EXAM: ULTRASOUND GUIDED DIAGNOSTIC AND THERAPEUTIC PARACENTESIS MEDICATIONS: 10 mL 1% lidocaine COMPLICATIONS: None immediate. PROCEDURE: Informed written consent was obtained from the patient after a discussion of the risks, benefits and alternatives to treatment. A timeout was performed prior to the initiation of the procedure. Initial ultrasound scanning demonstrates a large amount of ascites within the right lower abdominal quadrant. The right lower abdomen was prepped and draped in the usual sterile fashion. 1% lidocaine was used for local anesthesia. Following this, a 19 gauge, 10-cm, Yueh catheter was introduced. An ultrasound image was saved for documentation purposes. The paracentesis was performed. The catheter was removed and a dressing was applied. The patient tolerated the procedure well without immediate post procedural complication. FINDINGS: A total of approximately 3 L of hazy gold fluid was removed. Samples were sent to the laboratory as requested by the clinical  team. IMPRESSION: Successful ultrasound-guided paracentesis yielding 3 L of peritoneal fluid. Read by: Earley Abide, PA-C Electronically Signed   By: Sandi Mariscal M.D.   On: 07/03/2020 15:36   . sodium chloride   Intravenous Once  . sodium chloride   Intravenous Once  . acetaminophen  1,000 mg Oral Q6H WA  . amiodarone  200 mg Oral Daily  . amLODipine  5 mg Oral Daily  . apixaban  5 mg Oral BID  . ARIPiprazole  5 mg Oral Daily  . carvedilol  12.5 mg Oral BID  . Chlorhexidine Gluconate Cloth  6 each Topical Q0600  . Chlorhexidine Gluconate Cloth  6 each Topical Q0600  . [START ON 07/06/2020] darbepoetin (ARANESP) injection - DIALYSIS  200 mcg Intravenous Q Sat-HD  . diclofenac Sodium  4 g Topical QID  . doxercalciferol  4 mcg Oral Q T,Th,Sa-HD  . gabapentin  100 mg Oral QHS  . hydrALAZINE  100 mg Oral TID  . insulin aspart  0-15 Units Subcutaneous TID WC  . insulin aspart  0-5 Units Subcutaneous QHS  . insulin glargine  20 Units Subcutaneous Daily  . lidocaine  1 patch Transdermal Q24H  . linezolid  600 mg Oral Q12H  . melatonin  5 mg Oral QHS  . nystatin   Topical BID  . nystatin cream   Topical BID  . pantoprazole  40 mg Oral Daily  . polyethylene glycol  17 g Oral Daily  . QUEtiapine  100 mg Oral QHS  . sevelamer carbonate  1,600 mg Oral TID with meals  . sodium chloride flush  10-40 mL Intracatheter Q12H  . tamsulosin  0.4 mg Oral Daily    BMET    Component Value Date/Time   NA 134 (L) 07/04/2020 0310   K 3.9 07/04/2020 0310   CL 98 07/04/2020 0310   CO2 22 07/04/2020 0310   GLUCOSE 181 (H) 07/04/2020 0310   BUN 52 (H) 07/04/2020 0310   CREATININE 7.22 (H) 07/04/2020 0310   CALCIUM 8.6 (L) 07/04/2020 0310   GFRNONAA 8 (L) 07/04/2020 0310   GFRAA 9 (L) 07/04/2020 0310   CBC    Component Value Date/Time   WBC 8.2 07/04/2020 0310   RBC 2.61 (L) 07/04/2020 0310   HGB 7.4 (L) 07/04/2020 0310   HCT 24.7 (L) 07/04/2020 0310   PLT 215 07/04/2020 0310   MCV 94.6  07/04/2020 0310   MCH 28.4 07/04/2020 0310   MCHC 30.0 07/04/2020 0310   RDW 18.2 (H) 07/04/2020 0310   LYMPHSABS  0.7 07/01/2020 0031   MONOABS 1.1 (H) 07/01/2020 0031   EOSABS 0.2 07/01/2020 0031   BASOSABS 0.0 07/01/2020 0031   Outpatient: [facility]) Phone [phone] Scheduled TTS 4 hours His outpatient unit reports noncompliance BF 400; DF 800 F250 dialyzer  2/2.5 ca bath EDW 250 lbs Last weight 272.2 lbs on 6/24 (signed off early and had skipped the treatment prior) aranesp 120 mcg weekly  hectoral 4 mcg each tx  Assessment/Plan:  1. Enterococcus bacteremia/Mitral valve Endocarditis- due to tunneled HD catheter. TDC removed 6/29 s/p temp HD catheter placed 06/14/20.Now with new LIJ [place] placed 7/6. Will continue withIVcubicin andoralzyvoxper ID 1. Not candidate for surgery due to multiple co-morbidities, social issues, and polysubstance abuse 2. Noted vegetation on TTE 6/29 3. daptomycin through 07/22/20 and oral linezolid for 4 weeks (his home HD unit does have daptomycin). Check ck with dapto. 4. Follow up with ID on 07/08/20 2. ESRDcontinue with TTS schedule; catheter replaced by IR on 06/18/20. 1. TTS schedule here - Usual HD on schedule:  4h, 4-5L UF, 2K. Tight heparin. LIJ TDC.  2. UF goal today 4L, BP 120/24 (accurate? Rechecked and ), hr 83, qbf 400 3. maintain TTS schedule 4. Needs to dialyze in HD chair, still dialyzing in stretcher. 5. Fluid restriction advised 6. Renal diet 3. Anemia:due to ESRD. Continue with ESA. increased dose, will be receiving max dose qweekly. Transfusion per primary. ASx currently.  7/9 TSAT 16% and ferritin 561 holding IV Fe w/ #1 4. CKD-MBD:continue with home meds. Monitor phos. 5. Nutrition:renal diet 6. Hypertension:BPs srtable, CTM 7. Vascular access-s/pL IJ tunneled HD catheter per IR7/6/21. 8. Disposition- SW to assist with transportation to his outpatient unit.  Gean Quint,  MD [facility]

## 2020-07-05 LAB — BASIC METABOLIC PANEL
Anion gap: 11 (ref 5–15)
BUN: 39 mg/dL — ABNORMAL HIGH (ref 6–20)
CO2: 26 mmol/L (ref 22–32)
Calcium: 8.5 mg/dL — ABNORMAL LOW (ref 8.9–10.3)
Chloride: 97 mmol/L — ABNORMAL LOW (ref 98–111)
Creatinine, Ser: 5.9 mg/dL — ABNORMAL HIGH (ref 0.61–1.24)
GFR calc Af Amer: 12 mL/min — ABNORMAL LOW (ref >60)
GFR calc non Af Amer: 10 mL/min — ABNORMAL LOW (ref >60)
Glucose, Bld: 179 mg/dL — ABNORMAL HIGH (ref 70–99)
Potassium: 3.8 mmol/L (ref 3.5–5.1)
Sodium: 134 mmol/L — ABNORMAL LOW (ref 135–145)

## 2020-07-05 LAB — CBC
HCT: 23.7 % — ABNORMAL LOW (ref 39.0–52.0)
Hemoglobin: 7.1 g/dL — ABNORMAL LOW (ref 13.0–17.0)
MCH: 28.3 pg (ref 26.0–34.0)
MCHC: 30 g/dL (ref 30.0–36.0)
MCV: 94.4 fL (ref 80.0–100.0)
Platelets: 232 10*3/uL (ref 150–400)
RBC: 2.51 MIL/uL — ABNORMAL LOW (ref 4.22–5.81)
RDW: 18.6 % — ABNORMAL HIGH (ref 11.5–15.5)
WBC: 8.2 10*3/uL (ref 4.0–10.5)
nRBC: 0.2 % (ref 0.0–0.2)

## 2020-07-05 LAB — GLUCOSE, CAPILLARY
Glucose-Capillary: 230 mg/dL — ABNORMAL HIGH (ref 70–99)
Glucose-Capillary: 144 mg/dL — ABNORMAL HIGH (ref 70–99)
Glucose-Capillary: 201 mg/dL — ABNORMAL HIGH (ref 70–99)
Glucose-Capillary: 140 mg/dL — ABNORMAL HIGH (ref 70–99)
Glucose-Capillary: 127 mg/dL — ABNORMAL HIGH (ref 70–99)

## 2020-07-05 NOTE — Progress Notes (Signed)
Family Medicine Teaching Service Daily Progress Note Intern Pager: [phone]  Patient name: Lucas [name] Medical record number: [id number] Date of birth: June 02, 1971 Age: 49 y.o. Gender: male  Primary Care Provider: System, Pcp Not In Consultants: Nephrology, cardiology, infectious disease, gastroenterology Code Status: Full  Pt Overview and Major Events to Date:  Admitted for hyperglycemia and port pain on 06/09/2020 Enterococcus grew on blood cultures on 06/10/2020 TTE demonstrated mitral valve vegetation on 06/11/2020 Removal of right IJ HD catheter on 06/11/2020 TEE completed on 06/13/2020 Patient received left IJ nontunneled, restarted HD on 06/14/2020 Patient received long-term tunneled left IJ on 06/19/2020  Antibiotics Patient's current antibiotics and duration listed below. Gentamicin 6/29-6/30 Vancomycin 6/28-6/29 Linezolid 7/1-7/28 Daptomycin 7/1-8/9  Assessment and Plan: Lucas [name] a 49 y.o.malewith Enterococcus endocarditis. PMH is significant foratrial flutter s/p ablation, T2DM, ESRD, hyperlipidemia, anemia, CHF, GERD, polysubstance abuseandhomelessness.  Enterococcus endocarditis Patienthas been compliant with his treatmentthusfar. He recognizes that he may be in the hospital for an extended period.CVTS noted that he is not a good surgical candidate due to his significant comorbidities and current social situation. They advised following up in clinic following completion of his antibiotic course. -Nephrologyfollowing,patient stable for discharge from renal standpoint -Infectious disease sign off, 7/3, will f/u with patient 7/26 -ContinueLinezolidPOday22/28 -Continue IV daptomycin day22/42 -weekly labs: CBC w/ diff, BMP, CK -Continue to follow-up on repeat blood cultures from after his line was pulled -Hgb today7.1, continue to monitor CBC. Consider transfusion if Hgb lower than 7.   Scrotal swelling Patient 5/10  scrotal pain today and states that  it is improving. Patient seen in HD so did not examine scrotum today.USand CT pelvisof scrotum revealed diffuse scrotal wall edema present. Although patient has a history of ESRD, since he is not having any output, Lucas [name] (nephrologist) agreed that it was appropriate to get imaging with contrast to rule out fournier gangrene. CT pelvis w/ contrast demonstratedearly vascular necrosis of the left femoral head, diffuse nonspecific scrotal wall edema, ascitesand enlarged inguinal and pelvic lymph nodes. My suspicion is that patient may be experiencing referred pain to his groin in addition to fluid overload which may be contributing to his scrotal edema.  -Continue dialysis -Paracentesis as needed per GI -Monitor I/Os, continue fluid restriction -Continuenystatin -continue towean off nasal canula, currently on 2L and used it 2 times yesterday. Has been trying to use it less and with exertion.   Cirrhosis  Patient had a RUQ [place] in March 2021 showing cirrhosis and paracentesis in April.RUQ USon 7/17 demonstrated poorly distended gallbladder with diffuse thickening and changes of cirrhosis of the liver with an associated small to moderate amount of ascites.Hepatic panel showed non reactive to both Hepatitis B surface antigen and HCV antibody. Albumin 2.4 and alkaline phosphatase 308.GGT 425.CT abdomen demonstrated cirrhotic changes, large volume abdominal ascites and diffuse body wall edema suggesting anasarca, small bilateral pleural effusions with overlying left basilar atelectasis and age advanced vascular calcifications.Ammonia slightly elevated at 36. Consulted GI for concern of cirrhosis. Recommended to remove 1-2L for comfort. Paracentesis recommended and performed, yielded 3L of peritoneal fluid. AFP 0.9 wnl. ANA negative. Elevated anti-smooth muscle antibody 23. Elevated alpha antitrypsin 236. Elevated ceruloplasmin 34. Elevated IgA 632 and IgG 2076. Albumin wnl. -Continue dialysis  -GI  recommends paracentesis as needed, ascites monitored at HD  -Continue to monitor and follow GI recs  Anemia Hgb today is 7.1. Patient's Hgb has been averaging around 7 over the past few weeks, received 2 previous blood transfusions on 7/10 and  7/18 which patient agreed to on both occasions.  -Continue to monitor Hgb and consider transfusion if less than 7.    Infected dialysis catheter Resolved -Continue dialysis  -Tramadol twice daily as needed for pain -Flexeril 5mg  q 8hrs PRN for muscle pain -Continue to monitor CBCs  Right shoulder pain Patient 8/10 right shoulder pain improving and likely secondary toosteoarthritisnoted on x-ray. Exhibits full active ROM with slight tenderness on the posterior aspect of the shoulder. -Gabapentin100 mg -Voltaren gel, Tylenol, flexeril as needed for pain -Continue PT -Continue to monitor -May consider imaging if worsening symptoms -Per PT note, patient motivated to continue PT and enjoying therapeutic exercises -Include PT recommendation of outpatient physical therapy 3 times a week in discharge summary   Right forearm swelling Resolved. Possible thrombus in the right upper extremityhas been ruled out.  -VENOUSVAS US of the upper extremities found no evidence of DVT in the UE, no evidence of superficial vein thrombosis, no evidence of thrombosis in the subclavian. - Will continue to monitor -ContinueEliquis   Leftbicep pain Resolved. -Venous US of L upper extremitywhich demonstrated no evidence of DVT or superficial vein thrombosis in the left upper extremity.  Type 2 diabetes Most recentHgb A1c11.5 on 06/09/2020.CBG 144.Blood glucose179. -ContinueLantus -Continue to monitor blood glucose levels and adjust as needed -SSI moderate -At bedtime coverage -Continue gabapentin for diabetic neuropathy -Ordered prostheticsfor bilateral lower extremityper PT recs -Consider encouraging patient to follow-up with  PCP regarding proper diabetes management within the outpatient setting  Hypertension Patient'sBPtoday 108/76. He denies chest painanddyspnea. -Continue amlodipine5mg and hydralazine 100 mgtid daily -Continue to monitor vitals including BP  Atrial flutter s/p ablation Patient's home medication include amiodarone and apixaban. HR 74 today. -Continueamiodarone -ContinueEliquis -Continuous cardiac monitoring  Hyperlipidemia -Continue to holdAtorvastatin 40 mg -CK 339 wnlbut increasingon 7/19,likely in the event of daptomycin.Repeat CK 295 7/21 and CK 276 7/22. Continues to downtrend.  -Consider discussion with patient on importance of relative measures by incorporating appropriate lifestyle modifications including healthy diet and adequate physical activity to work in conjunction with current pharmacologic therapy.   Bipolar disorder Patient currently denies any symptoms including racing thoughts, mood is appropriate. -Abilify 5 mg daily  CHF -Continue to monitor fluid status -Dialysis TTS -Continue to monitor I/Os  GERD -Patient denies any reflux and epigastric pain at this time. -ContinueProtonix   Hx ofHomelessness Per chart review patient has history of homelessness. Patient reports that heisnot homeless at this time and that he is moving back [facility] soon. Patient does acknowledge that he missed his dialysis appointment because his ride to the dialysis appointment fell through. -request for wheelchair placed -Social work consulted for confirmation that patient has stable living situationand appropriate DME at discharge  Buttocks wound Wound is continuing to heal well.Patient denies any complaints at this time. Patient denies any associated pain.RN is educating patient on wound hygiene and prevention of transferring bacteria into his wounds.Continue to counsel patient on importance of moving positions to prevent decubitus ulcer  formation. -We will continue to monitor for any changes insymptoms  FEN/GI: renal carb modified  PPx: Eliquis   Disposition: Inpatient  Subjective:  No overnight events reported. Patient admits to dyspnea with exertion that he uses 4L nasal canula but used it 2 times yesterday. No longer using oxygen for comfort. Admits to right shoulder pain 8/10 and scrotal pain 5/10 today. Motivated with PT and awaiting wheelchair. Discussed patient's cirrhosis diagnosis and answered any questions. Explained that he may need paracentesis occasionally to remove fluid.  He admits to tingling in his left hip radiating down his knee, likely secondary to early avascular necrosis.   Objective: Temp:  [97.4 F (36.3 C)-98.3 F (36.8 C)] 98.3 F (36.8 C) (07/23 0510) Pulse Rate:  [71-93] 74 (07/23 0510) Resp:  [16-18] 18 (07/23 0510) BP: (108-151)/(42-97) 108/76 (07/23 0510) SpO2:  [94 %-100 %] 95 % (07/23 0510) Weight:  [131.1 kg-137.4 kg] 137.4 kg (07/23 0510) Physical Exam: General: Patient in no acute distress. Cardiovascular: regular rate and rhythm, no murmurs or gallops noted  Respiratory: no increased work of breathing noted, lungs clear to auscultation bilaterally  Abdomen: slightly increased distention, nontender, active bowel sounds  Extremities: amputation below the knee bilaterally GU: scrotum improving, no tenderness noted  Psych: no agitation noted, motivated.  Laboratory: Recent Labs  Lab 07/03/20 0609 07/04/20 0310 07/05/20 0436  WBC 9.2 8.2 8.2  HGB 7.5* 7.4* 7.1*  HCT 25.1* 24.7* 23.7*  PLT 208 215 232   Recent Labs  Lab 06/28/20 1025 06/29/20 2251 07/03/20 0609 07/04/20 0310 07/05/20 0436  NA 135   < > 134* 134* 134*  K 4.2   < > 3.6 3.9 3.8  CL 98   < > 96* 98 97*  CO2 25   < > 26 22 26   BUN 45*   < > 40* 52* 39*  CREATININE 6.34*   < > 5.80* 7.22* 5.90*  CALCIUM 8.5*   < > 8.7* 8.6* 8.5*  PROT 7.7  --   --   --   --   BILITOT 0.5  --   --   --   --   ALKPHOS  308*  --   --   --   --   ALT 8  --   --   --   --   AST 22  --   --   --   --   GLUCOSE 138*   < > 180* 181* 179*   < > = values in this interval not displayed.      Imaging/Diagnostic Tests: No results found.  Donney Dice, DO 07/05/2020, 6:14 AM PGY-1, [place] Intern pager: [phone], text pages welcome

## 2020-07-05 NOTE — Plan of Care (Signed)
  Problem: Activity: Goal: Risk for activity intolerance will decrease Outcome: Progressing   Problem: Pain Managment: Goal: General experience of comfort will improve Outcome: Progressing   

## 2020-07-05 NOTE — Progress Notes (Signed)
Received a call back from Dr. Antionette Fairy. Patient able to receive IV daptomycin at HD facility. Can be discharged after HD tomorrow and follow up with HD at his outpatient facility on Tuesday.   [name], DO 07/05/2020, 3:36 PM PGY-2, [place]

## 2020-07-05 NOTE — Progress Notes (Signed)
Patient ID: Lucas [name], male   DOB: 07/03/1971, 49 y.o.   MRN: [id number] S:   Patient reports having fluid in his abdomen. Breathing is ok. Tolerated hemodialysis yesterday, net UF yesterday 3,652cc.  O:BP 108/76 (BP Location: Left Arm)   Pulse 74   Temp 98.3 F (36.8 C)   Resp 18   Ht 6\' 4"  (1.93 m)   Wt (!) 137.4 kg   SpO2 95%   BMI 36.88 kg/m   Intake/Output Summary (Last 24 hours) at 07/05/2020 1401 Last data filed at 07/05/2020 0510 Gross per 24 hour  Intake 880 ml  Output 50 ml  Net 830 ml   Intake/Output: I/O last 3 completed shifts: In: 1180 [P.O.:1080; IV Piggyback:100] Out: 3702 [Urine:50; DXAJO:8786]    Intake/Output this shift:  No intake/output data recorded. Weight change: -0.907 kg Gen: nad, sleeping in comfortably, dialyzing in stretcher CVS: no rub, s1s2, rrr Resp: cta bl posteriorly, normal wob, unlabored, bl chest expansion, speaking in full sentences Abd: distended (improved), softer now, nontender Ext: s/p bilateral BKAs with thigh edema (trace) GU: scrotal swelling L IJ TDC bandaged  Recent Labs  Lab 06/29/20 2251 07/01/20 0031 07/02/20 0627 07/03/20 0609 07/04/20 0310 07/05/20 0436  NA 137 135 133* 134* 134* 134*  K 3.5 3.7 4.0 3.6 3.9 3.8  CL 98 99 97* 96* 98 97*  CO2 25 23 23 26 22 26   GLUCOSE 250* 202* 261* 180* 181* 179*  BUN 24* 37* 49* 40* 52* 39*  CREATININE 4.38* 5.84* 7.38* 5.80* 7.22* 5.90*  CALCIUM 8.6* 8.6* 8.7* 8.7* 8.6* 8.5*   Liver Function Tests: No results for input(s): AST, ALT, ALKPHOS, BILITOT, PROT, ALBUMIN in the last 168 hours. No results for input(s): LIPASE, AMYLASE in the last 168 hours. Recent Labs  Lab 07/02/20 0627  AMMONIA 36*   CBC: Recent Labs  Lab 07/01/20 0031 07/01/20 0031 07/02/20 0627 07/02/20 0627 07/03/20 0609 07/04/20 0310 07/05/20 0436  WBC 8.2   < > 7.7   < > 9.2 8.2 8.2  NEUTROABS 6.1  --   --   --   --   --   --   HGB 7.3*   < > 7.0*   < > 7.5* 7.4* 7.1*  HCT 24.8*   < > 23.9*    < > 25.1* 24.7* 23.7*  MCV 94.7  --  96.0  --  94.4 94.6 94.4  PLT 200   < > 197   < > 208 215 232   < > = values in this interval not displayed.   Cardiac Enzymes: Recent Labs  Lab 07/01/20 0031 07/03/20 0609 07/04/20 0310  CKTOTAL 339 295 276   CBG: Recent Labs  Lab 07/04/20 1619 07/04/20 2051 07/05/20 0118 07/05/20 0645 07/05/20 1106  GLUCAP 162* 227* 230* 144* 201*    Iron Studies:  No results for input(s): IRON, TIBC, TRANSFERRIN, FERRITIN in the last 72 hours. Studies/Results: IR Paracentesis  Result Date: 07/03/2020 INDICATION: Patient with history of cirrhosis, abdominal distension, and ascites. Request is made for diagnostic and therapeutic paracentesis up to 3 L. EXAM: ULTRASOUND GUIDED DIAGNOSTIC AND THERAPEUTIC PARACENTESIS MEDICATIONS: 10 mL 1% lidocaine COMPLICATIONS: None immediate. PROCEDURE: Informed written consent was obtained from the patient after a discussion of the risks, benefits and alternatives to treatment. A timeout was performed prior to the initiation of the procedure. Initial ultrasound scanning demonstrates a large amount of ascites within the right lower abdominal quadrant. The right lower abdomen was prepped and draped in  the usual sterile fashion. 1% lidocaine was used for local anesthesia. Following this, a 19 gauge, 10-cm, Yueh catheter was introduced. An ultrasound image was saved for documentation purposes. The paracentesis was performed. The catheter was removed and a dressing was applied. The patient tolerated the procedure well without immediate post procedural complication. FINDINGS: A total of approximately 3 L of hazy gold fluid was removed. Samples were sent to the laboratory as requested by the clinical team. IMPRESSION: Successful ultrasound-guided paracentesis yielding 3 L of peritoneal fluid. Read by: Earley Abide, PA-C Electronically Signed   By: Sandi Mariscal M.D.   On: 07/03/2020 15:36   . sodium chloride   Intravenous Once  .  sodium chloride   Intravenous Once  . acetaminophen  1,000 mg Oral Q6H WA  . amiodarone  200 mg Oral Daily  . amLODipine  5 mg Oral Daily  . apixaban  5 mg Oral BID  . ARIPiprazole  5 mg Oral Daily  . carvedilol  12.5 mg Oral BID  . Chlorhexidine Gluconate Cloth  6 each Topical Q0600  . Chlorhexidine Gluconate Cloth  6 each Topical Q0600  . [START ON 07/06/2020] darbepoetin (ARANESP) injection - DIALYSIS  200 mcg Intravenous Q Sat-HD  . diclofenac Sodium  4 g Topical QID  . doxercalciferol  4 mcg Oral Q T,Th,Sa-HD  . gabapentin  100 mg Oral QHS  . hydrALAZINE  100 mg Oral TID  . insulin aspart  0-15 Units Subcutaneous TID WC  . insulin aspart  0-5 Units Subcutaneous QHS  . insulin glargine  20 Units Subcutaneous Daily  . lidocaine  1 patch Transdermal Q24H  . linezolid  600 mg Oral Q12H  . melatonin  5 mg Oral QHS  . nystatin   Topical BID  . nystatin cream   Topical BID  . pantoprazole  40 mg Oral Daily  . polyethylene glycol  17 g Oral Daily  . QUEtiapine  100 mg Oral QHS  . sevelamer carbonate  1,600 mg Oral TID with meals  . sodium chloride flush  10-40 mL Intracatheter Q12H  . tamsulosin  0.4 mg Oral Daily    BMET    Component Value Date/Time   NA 134 (L) 07/05/2020 0436   K 3.8 07/05/2020 0436   CL 97 (L) 07/05/2020 0436   CO2 26 07/05/2020 0436   GLUCOSE 179 (H) 07/05/2020 0436   BUN 39 (H) 07/05/2020 0436   CREATININE 5.90 (H) 07/05/2020 0436   CALCIUM 8.5 (L) 07/05/2020 0436   GFRNONAA 10 (L) 07/05/2020 0436   GFRAA 12 (L) 07/05/2020 0436   CBC    Component Value Date/Time   WBC 8.2 07/05/2020 0436   RBC 2.51 (L) 07/05/2020 0436   HGB 7.1 (L) 07/05/2020 0436   HCT 23.7 (L) 07/05/2020 0436   PLT 232 07/05/2020 0436   MCV 94.4 07/05/2020 0436   MCH 28.3 07/05/2020 0436   MCHC 30.0 07/05/2020 0436   RDW 18.6 (H) 07/05/2020 0436   LYMPHSABS 0.7 07/01/2020 0031   MONOABS 1.1 (H) 07/01/2020 0031   EOSABS 0.2 07/01/2020 0031   BASOSABS 0.0 07/01/2020 0031    Outpatient: [facility]) Phone [phone] Scheduled TTS 4 hours His outpatient unit reports noncompliance BF 400; DF 800 F250 dialyzer  2/2.5 ca bath EDW 250 lbs Last weight 272.2 lbs on 6/24 (signed off early and had skipped the treatment prior) aranesp 120 mcg weekly  hectoral 4 mcg each tx  Assessment/Plan:  1. Enterococcus  bacteremia/Mitral valve Endocarditis- due to tunneled HD catheter. TDC removed 6/29 s/p temp HD catheter placed 06/14/20.Now with new LIJ [place] placed 7/6. Will continue withIVcubicin andoralzyvoxper ID 1. Not candidate for surgery due to multiple co-morbidities, social issues, and polysubstance abuse 2. Noted vegetation on TTE 6/29 3. daptomycin through 07/22/20 and oral linezolid for 4 weeks (his home HD unit does have daptomycin). Check ck with dapto. 4. Follow up with ID on 07/08/20 2. ESRDcontinue with TTS schedule; catheter replaced by IR on 06/18/20. 1. TTS schedule here - Usual HD on schedule:  4h, 4-5L UF, 2K. Tight heparin. LIJ TDC.  2. Plan for HD tomorrow 3. maintain TTS schedule 4. Needs to dialyze in HD chair, still dialyzing in stretcher. 5. Fluid restriction advised 6. Renal diet 3. Anemia:due to ESRD. Continue with ESA. increased dose, will be receiving max dose qweekly. Transfusion per primary. ASx currently.  7/9 TSAT 16% and ferritin 561 holding IV Fe w/ #1 4. CKD-MBD:continue with home meds. Monitor phos. 5. Nutrition:renal diet 6. Hypertension:BPs srtable, CTM 7. Vascular access-s/pL IJ tunneled HD catheter per IR7/6/21. 8. Disposition- SW to assist with transportation to his outpatient unit.  Gean Quint, MD [facility]

## 2020-07-05 NOTE — Progress Notes (Signed)
Called Dr. Antionette Fairy. No answer and answering machine states calls after 3pm will be returned the following business day. LVM to inquire if pt. could receive IV daptomycin at his outpatient HD facility. Will expect a call back Monday at the earliest.   Gerlene Fee, DO 07/05/2020, 3:22 PM PGY-2, [place]

## 2020-07-05 NOTE — Plan of Care (Signed)
  Problem: Pain Managment: Goal: General experience of comfort will improve Outcome: Progressing   Problem: Safety: Goal: Ability to remain free from injury will improve Outcome: Progressing   

## 2020-07-05 NOTE — Progress Notes (Signed)
PT Cancellation Note  Patient Details Name: Lucas [name] MRN: [id number] DOB: July 07, 1971   Cancelled Treatment:    Reason Eval/Treat Not Completed: Patient declined, no reason specified. Pt initially refusing upon therapist's arrival. Then as therapist was leaving, pt asking therapist "what did you want to do with me today?" Therapist explained goals of working on bed mobility, transfers and therex, to which pt again declining to participate. PT will continue to f/u with pt acutely as available.    Clearnce Sorrel Denney Shein 07/05/2020, 9:28 AM

## 2020-07-05 NOTE — Progress Notes (Signed)
Renal Navigator notes plans for discharge after HD tomorrow and that Primary MD has confirmed with OP HD clinic that patient can receive IV Daptomycin at OP HD clinic. This was previously confirmed, however, the concern for patient to be discharged with plan to receive antibiotics at OP HD clinic is that he has a hx of extreme non-compliance with OP HD. Navigator message Primary MD to ensure that she is aware. She states patient will be discharged after HD tomorrow. Navigator spoke with Education officer, museum at [facility] clinic to notify of patient's discharge. Navigator will send discharge summary on Monday. Navigator left message with [place] to inform of plan for discharge tomorrow in order to get standing order transportation re-instated for Tuesday.  Lucas [name], [place] Renal Navigator [phone]

## 2020-07-06 LAB — BASIC METABOLIC PANEL
Anion gap: 13 (ref 5–15)
BUN: 52 mg/dL — ABNORMAL HIGH (ref 6–20)
CO2: 25 mmol/L (ref 22–32)
Calcium: 8.6 mg/dL — ABNORMAL LOW (ref 8.9–10.3)
Chloride: 95 mmol/L — ABNORMAL LOW (ref 98–111)
Creatinine, Ser: 7.31 mg/dL — ABNORMAL HIGH (ref 0.61–1.24)
GFR calc Af Amer: 9 mL/min — ABNORMAL LOW (ref >60)
GFR calc non Af Amer: 8 mL/min — ABNORMAL LOW (ref >60)
Glucose, Bld: 266 mg/dL — ABNORMAL HIGH (ref 70–99)
Potassium: 4.1 mmol/L (ref 3.5–5.1)
Sodium: 133 mmol/L — ABNORMAL LOW (ref 135–145)

## 2020-07-06 LAB — GLUCOSE, CAPILLARY
Glucose-Capillary: 240 mg/dL — ABNORMAL HIGH (ref 70–99)
Glucose-Capillary: 230 mg/dL — ABNORMAL HIGH (ref 70–99)
Glucose-Capillary: 210 mg/dL — ABNORMAL HIGH (ref 70–99)
Glucose-Capillary: 236 mg/dL — ABNORMAL HIGH (ref 70–99)

## 2020-07-06 LAB — CBC
HCT: 24.7 % — ABNORMAL LOW (ref 39.0–52.0)
Hemoglobin: 7.3 g/dL — ABNORMAL LOW (ref 13.0–17.0)
MCH: 28.1 pg (ref 26.0–34.0)
MCHC: 29.6 g/dL — ABNORMAL LOW (ref 30.0–36.0)
MCV: 95 fL (ref 80.0–100.0)
Platelets: 242 10*3/uL (ref 150–400)
RBC: 2.6 MIL/uL — ABNORMAL LOW (ref 4.22–5.81)
RDW: 18.8 % — ABNORMAL HIGH (ref 11.5–15.5)
WBC: 8 10*3/uL (ref 4.0–10.5)
nRBC: 0 % (ref 0.0–0.2)

## 2020-07-06 MED ORDER — DOXERCALCIFEROL 4 MCG/2ML IV SOLN
INTRAVENOUS | Status: AC
  Administered 2020-07-06: 4 ug
  Filled 2020-07-06: qty 2

## 2020-07-06 MED ORDER — HEPARIN SODIUM (PORCINE) 1000 UNIT/ML IJ SOLN
INTRAMUSCULAR | Status: AC
  Administered 2020-07-06: 3800 [IU] via ARTERIOVENOUS_FISTULA
  Filled 2020-07-06: qty 4

## 2020-07-06 MED ORDER — DARBEPOETIN ALFA 200 MCG/0.4ML IJ SOSY
PREFILLED_SYRINGE | INTRAMUSCULAR | Status: AC
  Administered 2020-07-06: 200 ug via INTRAVENOUS
  Filled 2020-07-06: qty 0.4

## 2020-07-06 NOTE — Progress Notes (Signed)
Patient ID: Lucas [name], male   DOB: 1971/03/28, 49 y.o.   MRN: [id number] S:   Open for dialysis today. Patient reports that he is getting discharged today. No acute events.  O:BP (!) 135/88 (BP Location: Right Arm)   Pulse 91   Temp 98.4 F (36.9 C) (Oral)   Resp 16   Ht 6\' 4"  (1.93 m)   Wt (!) 136.5 kg   SpO2 100%   BMI 36.64 kg/m   Intake/Output Summary (Last 24 hours) at 07/06/2020 1322 Last data filed at 07/06/2020 7062 Gross per 24 hour  Intake 480 ml  Output 0 ml  Net 480 ml   Intake/Output: I/O last 3 completed shifts: In: 76 [P.O.:480; IV Piggyback:100] Out: 50 [Urine:50]    Intake/Output this shift:  Total I/O In: 240 [P.O.:240] Out: 0  Weight change: 0 kg Gen: nad, sitting up in bed CVS: no rub, s1s2, rrr Resp: cta bl posteriorly, normal wob, unlabored, bl chest expansion, speaking in full sentences Abd: distended (improved), softer now, nontender Ext: s/p bilateral BKAs with thigh edema (trace) L IJ TDC bandaged  Recent Labs  Lab 06/29/20 2251 07/01/20 0031 07/02/20 0627 07/03/20 0609 07/04/20 0310 07/05/20 0436 07/06/20 0702  NA 137 135 133* 134* 134* 134* 133*  K 3.5 3.7 4.0 3.6 3.9 3.8 4.1  CL 98 99 97* 96* 98 97* 95*  CO2 25 23 23 26 22 26 25   GLUCOSE 250* 202* 261* 180* 181* 179* 266*  BUN 24* 37* 49* 40* 52* 39* 52*  CREATININE 4.38* 5.84* 7.38* 5.80* 7.22* 5.90* 7.31*  CALCIUM 8.6* 8.6* 8.7* 8.7* 8.6* 8.5* 8.6*   Liver Function Tests: No results for input(s): AST, ALT, ALKPHOS, BILITOT, PROT, ALBUMIN in the last 168 hours. No results for input(s): LIPASE, AMYLASE in the last 168 hours. Recent Labs  Lab 07/02/20 0627  AMMONIA 36*   CBC: Recent Labs  Lab 07/01/20 0031 07/01/20 0031 07/02/20 3762 07/02/20 8315 07/03/20 0609 07/03/20 0609 07/04/20 0310 07/05/20 0436 07/06/20 0702  WBC 8.2   < > 7.7   < > 9.2   < > 8.2 8.2 8.0  NEUTROABS 6.1  --   --   --   --   --   --   --   --   HGB 7.3*   < > 7.0*   < > 7.5*   < > 7.4*  7.1* 7.3*  HCT 24.8*   < > 23.9*   < > 25.1*   < > 24.7* 23.7* 24.7*  MCV 94.7   < > 96.0  --  94.4  --  94.6 94.4 95.0  PLT 200   < > 197   < > 208   < > 215 232 242   < > = values in this interval not displayed.   Cardiac Enzymes: Recent Labs  Lab 07/01/20 0031 07/03/20 0609 07/04/20 0310  CKTOTAL 339 295 276   CBG: Recent Labs  Lab 07/05/20 1106 07/05/20 1826 07/05/20 2026 07/06/20 0643 07/06/20 1119  GLUCAP 201* 140* 127* 240* 230*    Iron Studies:  No results for input(s): IRON, TIBC, TRANSFERRIN, FERRITIN in the last 72 hours. Studies/Results: No results found. . sodium chloride   Intravenous Once  . sodium chloride   Intravenous Once  . acetaminophen  1,000 mg Oral Q6H WA  . amiodarone  200 mg Oral Daily  . amLODipine  5 mg Oral Daily  . apixaban  5 mg Oral BID  . ARIPiprazole  5 mg Oral Daily  . carvedilol  12.5 mg Oral BID  . Chlorhexidine Gluconate Cloth  6 each Topical Q0600  . Chlorhexidine Gluconate Cloth  6 each Topical Q0600  . darbepoetin (ARANESP) injection - DIALYSIS  200 mcg Intravenous Q Sat-HD  . diclofenac Sodium  4 g Topical QID  . doxercalciferol  4 mcg Oral Q T,Th,Sa-HD  . gabapentin  100 mg Oral QHS  . hydrALAZINE  100 mg Oral TID  . insulin aspart  0-15 Units Subcutaneous TID WC  . insulin aspart  0-5 Units Subcutaneous QHS  . insulin glargine  20 Units Subcutaneous Daily  . lidocaine  1 patch Transdermal Q24H  . linezolid  600 mg Oral Q12H  . melatonin  5 mg Oral QHS  . nystatin   Topical BID  . nystatin cream   Topical BID  . pantoprazole  40 mg Oral Daily  . polyethylene glycol  17 g Oral Daily  . QUEtiapine  100 mg Oral QHS  . sevelamer carbonate  1,600 mg Oral TID with meals  . sodium chloride flush  10-40 mL Intracatheter Q12H  . tamsulosin  0.4 mg Oral Daily    BMET    Component Value Date/Time   NA 133 (L) 07/06/2020 0702   K 4.1 07/06/2020 0702   CL 95 (L) 07/06/2020 0702   CO2 25 07/06/2020 0702   GLUCOSE 266 (H)  07/06/2020 0702   BUN 52 (H) 07/06/2020 0702   CREATININE 7.31 (H) 07/06/2020 0702   CALCIUM 8.6 (L) 07/06/2020 0702   GFRNONAA 8 (L) 07/06/2020 0702   GFRAA 9 (L) 07/06/2020 0702   CBC    Component Value Date/Time   WBC 8.0 07/06/2020 0702   RBC 2.60 (L) 07/06/2020 0702   HGB 7.3 (L) 07/06/2020 0702   HCT 24.7 (L) 07/06/2020 0702   PLT 242 07/06/2020 0702   MCV 95.0 07/06/2020 0702   MCH 28.1 07/06/2020 0702   MCHC 29.6 (L) 07/06/2020 0702   RDW 18.8 (H) 07/06/2020 0702   LYMPHSABS 0.7 07/01/2020 0031   MONOABS 1.1 (H) 07/01/2020 0031   EOSABS 0.2 07/01/2020 0031   BASOSABS 0.0 07/01/2020 0031   Outpatient: [facility] in [place] [facility]) Phone [phone] Scheduled TTS 4 hours His outpatient unit reports noncompliance BF 400; DF 800 F250 dialyzer  2/2.5 ca bath EDW 250 lbs Last weight 272.2 lbs on 6/24 (signed off early and had skipped the treatment prior) aranesp 120 mcg weekly  hectoral 4 mcg each tx  Assessment/Plan:  1. Enterococcus bacteremia/Mitral valve Endocarditis- due to tunneled HD catheter. TDC removed 6/29 s/p temp HD catheter placed 06/14/20.Now with new [facility] [place] placed 7/6. Will continue withIVcubicin andoralzyvoxper ID 1. Not candidate for surgery due to multiple co-morbidities, social issues, and polysubstance abuse 2. Noted vegetation on TTE 6/29 3. daptomycin through 07/22/20 and oral linezolid for 4 weeks (his home HD unit does have daptomycin). Check ck with dapto. 4. Follow up with ID on 07/08/20 2. ESRDcontinue with TTS schedule; catheter replaced by IR on 06/18/20. 1. TTS schedule here - Usual HD on schedule:  4h, 4-5L UF, 2K. Tight heparin. LIJ TDC.  2. Plan for HD today 3. maintain TTS schedule 4. Reinforced adherence/compliance to dialysis treatments 5. Fluid restriction advised 6. Renal diet 3. Anemia:due to ESRD. Continue with ESA. increased dose, will be receiving max dose qweekly. Transfusion per primary. ASx  currently.  7/9 TSAT 16% and ferritin 561 holding IV Fe w/ #1 4. CKD-MBD:continue with  home meds. Monitor phos. 5. Nutrition:renal diet 6. Hypertension:BPs stable, CTM 7. Vascular access-s/pL IJ tunneled HD catheter per IR7/6/21. 8. Disposition- SW to assist with transportation to his outpatient unit.  Gean Quint, MD [facility]

## 2020-07-06 NOTE — Progress Notes (Addendum)
Family Medicine Teaching Service Daily Progress Note Intern Pager: [phone]  Patient name: Lucas [name] Medical record number: [id number] Date of birth: 09/27/1971 Age: 49 y.o. Gender: male  Primary Care Provider: System, Pcp Not In Consultants: Nephrology, cardiology, infectious disease, gastroenterology, social work Code Status: Full  Pt Overview and Major Events to Date:  Admitted for hyperglycemia and port pain on 06/09/2020 Enterococcus grew on blood cultures on 06/10/2020 TTE demonstrated mitral valve vegetation on 06/11/2020 Removal of right IJ HD catheter on 06/11/2020 TEE completed on 06/13/2020 Patient received left IJ nontunneled, restarted HD on 06/14/2020 Patient received long-term tunneled left IJ on 06/19/2020  Antibiotics Patient's current antibiotics and duration listed below. Gentamicin 6/29-6/30 Vancomycin 6/28-6/29 Linezolid 7/1-7/28 Daptomycin 7/1-8/9   Assessment and Plan: Lucas Alfordis a 49 y.o.malewith Enterococcus endocarditis. PMH is significant foratrial flutter s/p ablation, T2DM, ESRD, hyperlipidemia, anemia, CHF, GERD, polysubstance abuseandhomelessness.  Enterococcus endocarditis Patienthas been compliant with his treatmentthusfar. He recognizes that he may be in the hospital for an extended period.CVTS noted that he is not a good surgical candidate due to his significant comorbidities and current social situation. They advised following up in clinic following completion of his antibiotic course. -Nephrologyfollowing,patient stable for discharge from renal standpoint -Infectious disease sign off, 7/3, will f/u with patient 7/26 -ContinueLinezolidPOday24/28 -Continue IV daptomycin day24/42 -weekly labs: CBC w/ diff, BMP, CK -states only on nasal canula 30 min yesterday, continue to wean off oxygen -Hgb today7.3, continue to monitor CBC. Consider transfusion if Hgb lower than 7. -Patient deemed candidate for receiving antibiotic treatment  along with outpatient HD  Scrotal swelling Patient denies scrotal pain today, states that it is improving.USand CT pelvisof scrotum revealed diffuse scrotal wall edema present. Although patient has a history of ESRD, since he is not having any output, Dr. Johnney Ou (nephrologist) agreed that it was appropriate to get imaging with contrast to rule out fournier gangrene. CT pelvis w/ contrast demonstratedearly vascular necrosis of the left femoral head, diffuse nonspecific scrotal wall edema, ascitesand enlarged inguinal and pelvic lymph nodes. My suspicion is that patient may be experiencing referred pain to his groin in addition to fluid overload which may be contributing to his scrotal edema.  -Continue dialysis -Paracentesis as needed per GI -Monitor I/Os, continue fluid restriction -Continuenystatin   Cirrhosis  Patient had a RUQ [place] in March 2021 showing cirrhosis and paracentesis in April.RUQ USon 7/17 demonstrated poorly distended gallbladder with diffuse thickening and changes of cirrhosis of the liver with an associated small to moderate amount of ascites.Hepatic panel showed non reactive to both Hepatitis B surface antigen and HCV antibody. Albumin 2.4 and alkaline phosphatase 308.GGT 425.CT abdomen demonstrated cirrhotic changes, large volume abdominal ascites and diffuse body wall edema suggesting anasarca, small bilateral pleural effusions with overlying left basilar atelectasis and age advanced vascular calcifications.Ammonia slightly elevated at 36. Consulted GI for concern of cirrhosis. Recommended to remove 1-2L for comfort. Paracentesis recommended and performed, yielded 3L of peritoneal fluid. AFP 0.9 wnl. ANA negative. Elevated anti-smooth muscle antibody 23. Elevated alpha antitrypsin 236. Elevated ceruloplasmin 34. Elevated IgA 632 and IgG 2076.Albumin wnl. -Continue dialysis  -GI recommends paracentesis as needed, ascites monitored at HD -Continue to monitor and  follow GI recs  Anemia Hgb today is 7.3. Patient's Hgb has been averaging around 7 over the past few weeks, received 2 previous blood transfusions on 7/10 and 7/18 which patient agreed to on both occasions.  -Continue to monitor Hgb and consider transfusion if less than 7.    Infected dialysis  catheter Resolved -Continue dialysis  -Tramadol twice daily as needed for pain -Flexeril 5mg  q 8hrs PRN for muscle pain -Continue to monitor CBCs  Right shoulder pain Endorses some pain andlikely secondary toosteoarthritisnoted on x-ray. Exhibits full active ROM. -Gabapentin100 mg -Voltaren gel, Tylenol, flexeril as needed for pain -Continue PT -Continue to monitor -May consider imaging if worsening symptoms -Per PT note, patient motivated to continue PT and enjoying therapeutic exercises -Include PT recommendation of outpatient physical therapy 3 times a week in discharge summary   Right forearm swelling Resolved. Possible thrombus in the right upper extremityhas been ruled out.  -VENOUSVAS US of the upper extremities found no evidence of DVT in the UE, no evidence of superficial vein thrombosis, no evidence of thrombosis in the subclavian. - Will continue to monitor -ContinueEliquis   Leftbicep pain Resolved. -Venous US of L upper extremitywhich demonstrated no evidence of DVT or superficial vein thrombosis in the left upper extremity.  Type 2 diabetes Most recentHgb A1c11.5 on 06/09/2020.OXB353>299.Blood glucose266. -ContinueLantus -Continue to monitor blood glucose levels and adjust as needed -SSI moderate -At bedtime coverage -Continue gabapentin for diabetic neuropathy -Ordered prostheticsfor bilateral lower extremityper PT recs -Consider encouraging patient to follow-up with PCP regarding proper diabetes management within the outpatient setting  Hypertension Patient'sBPtoday135/88. He denies chest painanddyspnea. -Continue  amlodipine5mg and hydralazine 100 mgtid daily -Continue to monitor vitals including BP  Atrial flutter s/p ablation Patient's home medication include amiodarone and apixaban. HR 91 today. -Continueamiodarone -ContinueEliquis -Continuous cardiac monitoring  Hyperlipidemia -Continue to holdAtorvastatin 40 mg, likely until daptomycin treatment completed  -CK 339 wnlbut increasingon 7/19,likely in the event of daptomycin.Repeat CK 295 7/21and CK 276 7/22. Continues to downtrend. -Consider discussion with patient on importance of relative measures by incorporating appropriate lifestyle modifications including healthy diet and adequate physical activity to work in conjunction with current pharmacologic therapy.   Bipolar disorder Patient currently denies any symptoms including racing thoughts, mood is appropriate. -Abilify 5 mg daily  CHF -Continue to monitor fluid status -Dialysis TTS -Continue to monitor I/Os  GERD -Patient denies any reflux and epigastric pain at this time. -ContinueProtonix   Hx ofHomelessness Per chart review patient has history of homelessness. Patient reports that heisnot homeless at this time and that he is moving back to [facility] soon. Patient does acknowledge that he missed his dialysis appointment because his ride to the dialysis appointment fell through. -request for wheelchair placed -Reached out to social work for confirmation that patient has stable living situationand appropriate DME at discharge, patient may be able to live with friend. Social work following.   Buttocks wound Wound is continuing to heal well.Patient denies any complaints at this time. Patient denies any associated pain.RN is educating patient on wound hygiene and prevention of transferring bacteria into his wounds.Continue to counselpatient on importance of moving positions to prevent decubitus ulcer formation. -We will continue to monitor for any  changes insymptoms  FEN/GI: renal carb modified  PPx: Eliquis   Disposition: Potential discharge, awaiting social work for status on patient's living situation  Subjective:  Patient seen and examined at bedside. Endorses some right shoulder pain, voltaren gel helps. Denies other symptoms. States that scrotum has improved, denies any associated pain or dysuria.   Objective: Temp:  [98.5 F (36.9 C)-98.8 F (37.1 C)] 98.5 F (36.9 C) (07/24 0519) Pulse Rate:  [91-94] 91 (07/24 0519) Resp:  [18] 18 (07/24 0519) BP: (124-141)/(74-86) 141/86 (07/24 0519) SpO2:  [98 %-99 %] 99 % (07/24 0519) Weight:  [136.5 kg]  136.5 kg (07/24 0519) Physical Exam: General: Patient in no acute distress. Cardiovascular: regular rate and rhythm, no murmurs or gallops noted Respiratory: lungs clear to auscultation bilaterally, no rhonchi or rales appreciated Abdomen: distended, nontender on palpation, active bowel sounds  Extremities: amputation below the knee bilaterally  Psych: mood appropriate, no agitation noted   Laboratory: Recent Labs  Lab 07/03/20 0609 07/04/20 0310 07/05/20 0436  WBC 9.2 8.2 8.2  HGB 7.5* 7.4* 7.1*  HCT 25.1* 24.7* 23.7*  PLT 208 215 232   Recent Labs  Lab 07/03/20 0609 07/04/20 0310 07/05/20 0436  NA 134* 134* 134*  K 3.6 3.9 3.8  CL 96* 98 97*  CO2 26 22 26   BUN 40* 52* 39*  CREATININE 5.80* 7.22* 5.90*  CALCIUM 8.7* 8.6* 8.5*  GLUCOSE 180* 181* 179*      Imaging/Diagnostic Tests: No results found.  Donney Dice, DO 07/06/2020, 6:20 AM PGY-1, [place] Intern pager: [phone], text pages welcome

## 2020-07-06 NOTE — TOC Progression Note (Addendum)
Transition of Care [facility]) - Progression Note    Patient Details  Name: Isao Seltzer MRN: [id number] Date of Birth: 03/13/1971  Transition of Care [facility]) CM/SW [place], [place] Phone Number: 07/06/2020, 12:27 PM  Clinical Narrative:    5:17p: CSW attempted and was unable to make contact with [name]. CSW spoke with patient and was informed he has spoken to [name] and confirmed he is able to stay with him on Sunday. CSW informed patient that he will not be able to get another wheelchair from [employer], considering he already has one with them. Patient stated he will contact [employer] on Monday for another wheelchair.   12:27p CSW contacted by DO [place] and informed patient is homeless. CSW spoke with patient as he was headed to HD, who stated he may be able to stay with his friend [name] [phone] at [facility] until he gets his SSI check next week. Patient stated his friend [name] is working on verifying if patient can stay with him and provided CSW permission to contact him. Patient stated he does not have transportation to leave today and his wheelchair is at a house he was renting. CSW will follow-up with [name].  Expected Discharge Plan: Home/Self Care Barriers to Discharge: Continued Medical Work up  Expected Discharge Plan and Services Expected Discharge Plan: Home/Self Care   Discharge Planning Services: CM Consult   Living arrangements for the past 2 months: Single Family Home                 DME Arranged: N/A         HH Arranged: NA           Social Determinants of Health (SDOH) Interventions    Readmission Risk Interventions No flowsheet data found.

## 2020-07-06 NOTE — Discharge Summary (Addendum)
[facility] Discharge Summary  Patient name: Lucas [name] Medical record number: [id number] Date of birth: 07-29-1971 Age: 49 y.o. Gender: male Date of Admission: 06/09/2020  Date of Discharge: 07/07/2020 Admitting Physician: Gifford Shave, MD  Primary Care Provider: System, Pcp Not In Consultants: Nephrology, cardiology, infectious disease, gastroenterology  Indication for Hospitalization: Chest pain   Discharge Diagnoses/Problem List:  Enterococcus endocarditis Scrotal edema Cirrhosis Anemia of chronic disease Type 2 diabetes Hypertension Right shoulder pain Atrial flutter s/p ablation Hyperlipidemia Bipolar disorder CHF GERD History of homelessness Buttocks wound   Disposition: Friend's home   Discharge Condition: medically stable   Discharge Exam:   Temp:  [97.4 F (36.3 C)-98.5 F (36.9 C)] 98.5 F (36.9 C) (07/25 0446) Pulse Rate:  [80-99] 82 (07/25 0446) Resp:  [14-18] 14 (07/25 0446) BP: (106-153)/(69-96) 124/81 (07/25 0446) SpO2:  [96 %-100 %] 100 % (07/25 0446) Weight:  [136.1 kg-138.8 kg] 138.8 kg (07/24 2040) Physical Exam: General: Alert and cooperative and appears to be in no acute distress.  Sitting in bed comfortably during our conversation.  Energetic and eager to leave the hospital. HEENT: Neck non-tender without lymphadenopathy, masses or thyromegaly Cardio: Normal S1 and S2, no S3 or S4. Rhythm is regular.  3/6 systolic murmur.   Pulm: Clear to auscultation bilaterally, no crackles, wheezing, or diminished breath sounds. Normal respiratory effort.  Initially had nasal cannula in place at 4 L upon entering the room.  He took this off during our conversation and remarked that it was only for comfort.  He had no trouble with shortness of breath or speaking in long sentences following removal of his nasal cannula. Abdomen: Bowel sounds normal. Abdomen soft and non-tender.  Extremities: No peripheral edema. Warm/ well  perfused. Neuro: Cranial nerves grossly intact  Physical exam performed by Dr. Matilde Haymaker on day of discharge   Brief Hospital Course:  Lucas [name] is a 49 y.o. male presenting with hyperglycemia and pain in his chest surrounding the right subclavian port which radiates into his neck, ultimately found to have bacteremia/endocarditis. PMH is significant for atrial flutter s/p ablation, T2DM, ESRD, hyperlipidemia, anemia, CHF, GERD, polysubstance abuse, homelessness.   Enterococcus mitral valve endocarditis with bacteremia:  Patient initially presented to the ED due to HD catheter site pain with concerns for infection, despite changing every Tuesday.  He was afebrile without leukocytosis no overlying skin changes, thus antibiotics were held until 6/28 when initial blood cultures grew enterococcus faecalis. ID was consulted and started vancomycin/gentamicin. TTE/TEE showed a significant mitral valve vegetation with perforation through the posterior leaflet.  CVTS was consulted, recommended follow-up outpatient after completion of IV antibiotics. Unfortunately the isolate was resistant to gentamicin, thus transitioned to IV daptomycin and oral linezolid [name] on 7/1.  HD Catheter was replaced during this time.  Repeat blood cultures remained negative after IV abx initiation. he was not considered an outpatient candidate for IV antibiotics due to tenuous home situation/transportation.  He stayed inpatient to receive IV antibiotics, however he decided to go home on Sunday, 7/25 prior to confirmation of ability to receive IV antibiotics at his outpatient HD despite our recommendations to wait.  He understood the absolute importance to contact his center to continue treatment.  At discharge, he was sent home with 2 additional days of linezolid to complete a 4-week course and will need 2 additional weeks of IV daptomycin via HD (for 6 weeks total), complete on 07/22/2020.  PICC was removed prior to DC.  Type 2  DM Patient presented hyperglycemic and was started on insulin drip in the ED; however the patient did not appear to be in DKA nor HHS and was transitioned to SQ insulin and a diet. Patient was placed on sensitive sliding scale and Lantus 20 U daily. We continued to monitor Glucose. Patient A1c was 11.5. Patient continued to need significant amounts of SAI and 7/1 LAI was increased from 20 to 25U. Patient glucose levels remained stable with the regimen of Lantus 25 units and sensitive sliding scale for the remainder of his hospital stay.  Right shoulder pain Patient continued to report right shoulder pain that spread into the neck along the sternocleidomastoid . X-ray demonstrated signs of osteoarthritis with no acute changes. The patient responded well to Volatern gel to the area for a few days then later required a few doses of oxycodone for the pain to provide adequate relief in order for him to sleep. Gabapentin was increased to 300 mg for additional pain control and later decreased to 100 mg as pain subsided and patient experiencing adverse effect of vivid dreams and brief confusion. Pain likely secondary to osteoarthritis and later responded to voltaren gel not requiring oxycodone.   Liver cirrhosis  Noted he had had at least 2 paracentesis (via care everywhere records) in the past due to ascites, however had not been diagnosed with liver cirrhosis.  Despite increased HD, he continued to experience further abdominal distention/scrotal edema related to volume overload.  RUQ U/S showing liver cirrhosis with small/moderate ascites.  GI was consulted, obtained CT ab/pelvis again demonstrating cirrhotic changes to liver with ascites, without evidence of portal hypertension.  Obtained liver cirrhosis metabolic/autoimmune panel, showing elevated anti-smooth muscle antibody, alpha-1 antitrypsin, ceruloplasmin, IgA, and IgG.  Received therapeutic/diagnostic paracentesis w/ 3L off on 7/21, no evidence of SBP.  GI  recommended intermittent paracentesis as needed for ascites, without any particular necessity for GI follow-up outpatient.  Anemia of ESRD Patient's anemia likely secondary to ESRD, cirrhosis, prolonged illness. Hgb fluctuated around 7 throughout the majority of patient's hospitalization. Required blood transfusion on 2 separate occasions, 7/10 and 7/18.  Hgb 7.5 on discharge, BL around 8-9. Received ESA supplementation per nephrology.  Issues for Follow Up:  1.  Please ensure that he continues to receive his daptomycin via HD until 07/22/2020 and that he completed his linezolid.  2.  Ensure he follows up with infectious disease, he has a virtual appointment on 8/5. 3.  Monitor fluid status and ascites, consider periodic paracentesis if HD unable to maintain euvolemia. 4.  Monitor CBGs.  Glucose range 140-250s at discharge with Lantus 25 and SSI. 5.  Obtain CBC on follow-up.  Hgb 7.5 on DC, see above. 6.  Make sure he follows up with CVTS to discuss mitral valve disruption due to endocarditis after completion of IV antibiotics, seen by Dr. Kipp Brood during hospitalization. 7.  His atorvastatin was discontinued during stay due to elevated CK with daptomycin, please consider restarting.  Significant Procedures:  TTE demonstrated mitral valve vegetation on 06/11/2020 Removal of right IJ HD catheter on 06/11/2020 TEE demonstrated moderate vegetation on the mitral valve and EF of 45 to 50% on 06/13/2020 Patient received left IJ nontunneled, restarted HD on 06/14/2020 Patient received long-term tunneled left IJ on 06/19/2020 IR Paracentesis retrieved 3L fluid on 07/03/2020.  Significant Labs and Imaging:  Recent Labs  Lab 07/05/20 0436 07/06/20 0702 07/07/20 0719  WBC 8.2 8.0 7.5  HGB 7.1* 7.3* 7.5*  HCT 23.7* 24.7* 24.8*  PLT 232 242  228   Recent Labs  Lab 07/03/20 0609 07/03/20 0609 07/04/20 0310 07/04/20 0310 07/05/20 0436 07/05/20 0436 07/06/20 0702 07/07/20 0719  NA 134*  --  134*  --   134*  --  133* 135  K 3.6   < > 3.9   < > 3.8   < > 4.1 3.9  CL 96*  --  98  --  97*  --  95* 96*  CO2 26  --  22  --  26  --  25 23  GLUCOSE 180*  --  181*  --  179*  --  266* 142*  BUN 40*  --  52*  --  39*  --  52* 37*  CREATININE 5.80*  --  7.22*  --  5.90*  --  7.31* 5.76*  CALCIUM 8.7*  --  8.6*  --  8.5*  --  8.6* 8.6*   < > = values in this interval not displayed.    TEE: IMPRESSIONS   1. Left ventricular ejection fraction, by estimation, is 45 to 50%. The  left ventricle has mildly decreased function. The left ventricle  demonstrates global hypokinesis. There is moderate concentric left  ventricular hypertrophy.  2. Right ventricular systolic function is normal. The right ventricular  size is mildly enlarged. There is mildly elevated pulmonary artery  systolic pressure. The estimated right ventricular systolic pressure is  33.2 mmHg.  3. Left atrial size was severely dilated. No left atrial/left atrial  appendage thrombus was detected.  4. Right atrial size was severely dilated.  5. Moderate vegetation on the mitral valve.  6. There is a perforation in the posterior mitral leaflet, at the P2-P3  scallop junction. The atrial surface of the perforation is surrounded by  the vegetation. While the vegetation is sessile and minimally mobile, it  has some small mobile components.  Pulmonary vein flow reversal is seen in the right upper pulmonary vein,  but there is diastolic dominant antegrade flow in the left upper pulmonary  vein. By PISA, the effective regurgitant orifice area is 0.32 cm sq,  regurgitant volume 40 mL. The mitral  valve is otherwise normal in structure. Moderate to severe mitral valve  regurgitation.  7. Dilated tricuspid annulus. Tricuspid valve regurgitation is severe.  8. The aortic valve is tricuspid. Aortic valve regurgitation is not  visualized. No aortic stenosis is present.   CT abdomen:  IMPRESSION: 1. Very heterogeneous appearance of  the liver probably geographic fatty infiltration. 2. Cirrhotic changes but no obvious hepatic lesions. 3. Large volume abdominal ascites and diffuse body wall edema suggesting anasarca. 4. Small bilateral pleural effusions with overlying left basilar atelectasis. 5. Age advanced vascular calcifications.  CT pelvis:  IMPRESSION: 1. Diffuse nonspecific scrotal wall edema. No gas. No abscess. 2. Large volume of ascites in the partially visualized abdomen. 3. Diffuse anasarca. 4. Enlarged inguinal and pelvic lymph nodes. Many of these were visualized on the patient's CT from May 2021 an appear relatively similar in size. In the absence of any known malignancy, these are favored to be reactive in etiology. 5. Early avascular necrosis of the left femoral head.  Results/Tests Pending at Time of Discharge:  No results found.  Discharge Medications:  Allergies as of 07/07/2020   No Known Allergies     Medication List    STOP taking these medications   buPROPion 150 MG 12 hr tablet Commonly known as: WELLBUTRIN SR   doxycycline 100 MG capsule Commonly known as: VIBRAMYCIN  pregabalin 25 MG capsule Commonly known as: LYRICA     TAKE these medications   amiodarone 200 MG tablet Commonly known as: PACERONE Take 200 mg by mouth daily.   amLODipine 10 MG tablet Commonly known as: NORVASC Take 10 mg by mouth daily.   ARIPiprazole 5 MG tablet Commonly known as: ABILIFY Take 5 mg by mouth daily.   atorvastatin 40 MG tablet Commonly known as: LIPITOR Take 1 tablet (40 mg total) by mouth daily.   bumetanide 2 MG tablet Commonly known as: BUMEX Take 2 mg by mouth See admin instructions. Taking 2 mg twice daily except on HD days, Tues, Thurs, and Saturday not taking.   carvedilol 12.5 MG tablet Commonly known as: Coreg Take 1 tablet (12.5 mg total) by mouth 2 (two) times daily with a meal.   DAPTOmycin 1,000 mg in sodium chloride 0.9 % 100 mL Inject 1,000 mg into the vein  every other day.   Eliquis 5 MG Tabs tablet Generic drug: apixaban Take 5 mg by mouth 2 (two) times daily.   gabapentin 100 MG capsule Commonly known as: NEURONTIN Take 1 capsule (100 mg total) by mouth at bedtime.   HumaLOG 100 UNIT/ML injection Generic drug: insulin lispro Inject 2-12 Units into the skin 3 (three) times daily before meals. Per sliding scale:  BG 180-200= 2 units, 201-250= 5 units, 251-300= 7 units, 301-350= 10 units, 351-400 = 12 units, 400 Call MD   hydrALAZINE 100 MG tablet Commonly known as: APRESOLINE Take 100 mg by mouth 3 (three) times daily.   insulin glargine 100 UNIT/ML injection Commonly known as: LANTUS Inject 0.25 mLs (25 Units total) into the skin at bedtime.   Insulin Pen Needle 29G X 12.7MM Misc 30 Containers by Does not apply route daily.   linezolid 600 MG tablet Commonly known as: ZYVOX Take 1 tablet (600 mg total) by mouth every 12 (twelve) hours for 7 doses.   pantoprazole 40 MG tablet Commonly known as: PROTONIX Take 40 mg by mouth daily.   polyethylene glycol 17 g packet Commonly known as: MIRALAX / GLYCOLAX Take 17 g by mouth daily. Start taking on: July 08, 2020   QUEtiapine 200 MG tablet Commonly known as: SEROQUEL Take 200 mg by mouth at bedtime.   sevelamer carbonate 800 MG tablet Commonly known as: RENVELA Take 1,600 mg by mouth 3 (three) times daily.   tamsulosin 0.4 MG Caps capsule Commonly known as: FLOMAX Take 0.4 mg by mouth daily.       Discharge Instructions: Please refer to Patient Instructions section of EMR for full details.  Patient was counseled important signs and symptoms that should prompt return to medical care, changes in medications, dietary instructions, activity restrictions, and follow up appointments.   Follow-Up Appointments:  Follow-up Information    Hendren Akram Bajillan,MD. Schedule an appointment as soon as possible for a visit.   Why: Please schedule with your PCP for hospital  follow-up.       [facility]              Follow up.   Why: They should be calling you later this week to schedule a video visit follow-up.  Please call their office if you have not heard from them. Contact information: [place] [address] [place] [address]              Donney Dice, DO 07/07/2020, 8:06 PM PGY-1, [place] Upper-Level Resident  Addendum  My edits for correction/addition/clarification are added. Please see also any attending notes.    Patriciaann Clan, DO  Family Medicine PGY-3

## 2020-07-07 LAB — CBC
HCT: 24.8 % — ABNORMAL LOW (ref 39.0–52.0)
Hemoglobin: 7.5 g/dL — ABNORMAL LOW (ref 13.0–17.0)
MCH: 29.1 pg (ref 26.0–34.0)
MCHC: 30.2 g/dL (ref 30.0–36.0)
MCV: 96.1 fL (ref 80.0–100.0)
Platelets: 228 10*3/uL (ref 150–400)
RBC: 2.58 MIL/uL — ABNORMAL LOW (ref 4.22–5.81)
RDW: 19.2 % — ABNORMAL HIGH (ref 11.5–15.5)
WBC: 7.5 10*3/uL (ref 4.0–10.5)
nRBC: 0 % (ref 0.0–0.2)

## 2020-07-07 LAB — BASIC METABOLIC PANEL
Anion gap: 16 — ABNORMAL HIGH (ref 5–15)
BUN: 37 mg/dL — ABNORMAL HIGH (ref 6–20)
CO2: 23 mmol/L (ref 22–32)
Calcium: 8.6 mg/dL — ABNORMAL LOW (ref 8.9–10.3)
Chloride: 96 mmol/L — ABNORMAL LOW (ref 98–111)
Creatinine, Ser: 5.76 mg/dL — ABNORMAL HIGH (ref 0.61–1.24)
GFR calc Af Amer: 12 mL/min — ABNORMAL LOW (ref >60)
GFR calc non Af Amer: 11 mL/min — ABNORMAL LOW (ref >60)
Glucose, Bld: 142 mg/dL — ABNORMAL HIGH (ref 70–99)
Potassium: 3.9 mmol/L (ref 3.5–5.1)
Sodium: 135 mmol/L (ref 135–145)

## 2020-07-07 LAB — GLUCOSE, CAPILLARY
Glucose-Capillary: 188 mg/dL — ABNORMAL HIGH (ref 70–99)
Glucose-Capillary: 178 mg/dL — ABNORMAL HIGH (ref 70–99)

## 2020-07-07 LAB — CK: Total CK: 208 U/L (ref 49–397)

## 2020-07-07 MED ORDER — LINEZOLID 600 MG PO TABS: 600.0000 mg | ORAL_TABLET | Freq: Two times a day (BID) | ORAL | 0 refills | Status: AC

## 2020-07-07 MED ORDER — GABAPENTIN 100 MG PO CAPS: 100.0000 mg | ORAL_CAPSULE | Freq: Every day | ORAL | 0 refills | Status: AC

## 2020-07-07 MED ORDER — SODIUM CHLORIDE 0.9 % IV SOLN: 1000.0000 mg | INTRAVENOUS | Status: AC

## 2020-07-07 MED ORDER — INSULIN GLARGINE 100 UNIT/ML ~~LOC~~ SOLN
25.0000 [IU] | Freq: Every day | SUBCUTANEOUS | 1 refills | Status: AC
Start: 2020-07-07 — End: ?

## 2020-07-07 MED ORDER — POLYETHYLENE GLYCOL 3350 17 G PO PACK: 17.0000 g | PACK | Freq: Every day | ORAL | 0 refills | Status: AC

## 2020-07-07 MED ORDER — INSULIN GLARGINE 100 UNIT/ML ~~LOC~~ SOLN
25.0000 [IU] | Freq: Every day | SUBCUTANEOUS | Status: DC
  Administered 2020-07-07: 25 [IU] via SUBCUTANEOUS
  Filled 2020-07-07: qty 0.25

## 2020-07-07 NOTE — Progress Notes (Signed)
° ° ° ° °  INFECTIOUS DISEASE ATTENDING ADDENDUM:   Date: 07/07/2020  Patient name: Lucas [name]  Medical record number: [id number]  Date of birth: 04/23/1971   Patient still in hospital being DC tomorrow  I have DC his appt with Dr. Megan Salon and made Video Visit with Janene Madeira for 07/18/2020 at [place] 07/07/2020, 11:41 AM

## 2020-07-07 NOTE — Progress Notes (Signed)
Family Medicine Teaching Service Daily Progress Note Intern Pager: [phone]  Patient name: Lucas [name] Medical record number: [id number] Date of birth: [birth year] Age: 49 y.o. Gender: male  Primary Care Provider: System, Pcp Not In Consultants: Nephrology, cardiology, infectious disease, gastroenterology, social work Code Status: Full  Pt Overview and Major Events to Date:  Admitted for hyperglycemia and port pain on 06/09/2020 Enterococcus grew on blood cultures on 06/10/2020 TTE demonstrated mitral valve vegetation on 06/11/2020 Removal of right IJ HD catheter on 06/11/2020 TEE completed on 06/13/2020 Patient received left IJ nontunneled, restarted HD on 06/14/2020 Patient received long-term tunneled left IJ on 06/19/2020  Antibiotics Patient's current antibiotics and duration listed below. Gentamicin 6/29-6/30 Vancomycin 6/28-6/29 Linezolid 7/1-7/28 Daptomycin 7/1-8/9  Assessment and Plan: Lucas Alfordis a 49 y.o.malewith Enterococcus endocarditis. PMH is significant foratrial flutter s/p ablation, T2DM, ESRD, hyperlipidemia, anemia, CHF, GERD, polysubstance abuseandhomelessness.  Enterococcus endocarditis Patienthas been compliant with his treatmentthusfar. He recognizes that he may be in the hospital for an extended period.CVTS noted that he is not a good surgical candidate due to his significant comorbidities and current social situation. They advised following up in clinic following completion of his antibiotic course. He may be able to receive antibiotics with HD in the outpatient setting if other social barriers are addressed, he can be discharged.  He is interested in leaving today.  We discussed waiting until tomorrow so that he could be seen by infectious disease, we could have more time to find a wheelchair for him and ensure that he can complete his antibiotic regimen in the outpatient setting. -Infectious disease signed off, 7/3, will f/u with patient  7/26 -ContinueLinezolidPOday25/28 -Continue IV daptomycin day25/42 -weekly labs: CBC w/ diff, BMP, CK  Scrotal swelling Likely secondary to cirrhosis and ascites build up. No further workup at this time. -Paracentesis as needed -Continuenystatin PRN  Cirrhosis w/ ascites No further workup at this time. -GI signed off - paracentesis as needed, ascites monitored at HD  Anemia Hgb today 7.5 -Continue to monitor Hgb and consider transfusion if less than 7.   Right shoulder pain Endorses some pain andlikely secondary toosteoarthritisnoted on x-ray. Exhibits full active ROM. -Gabapentin100 mg -Voltaren gel, Tylenol, flexeril as needed for pain -Continue PT -Continue to monitor -Include PT recommendation of outpatient physical therapy 3 times a week in discharge summary  Type 2 diabetes Most recentHgb A1c11.5 on 06/09/2020.CBGs doing 200 09-2065 in the past 24 hours.  20 units long-acting, 17 units short acting the past 24 hours. -Increase Lantus to 25 units -SSI moderate -At bedtime coverage  Hypertension Well-controlled the past 24 hours. -Amlodipine 5 mg -Carvedilol 12.5 mg -Hydralazine 100 mg 3 times daily  Atrial flutter s/p ablation Patient's home medication include amiodarone and apixaban. HR 91 today. -Continueamiodarone -ContinueEliquis  Hyperlipidemia -Continue to holdAtorvastatin 40 mg, likely until daptomycin treatment completed   Bipolar disorder -Abilify 5 mg daily  CHF -Continue to monitor fluid status -Dialysis TTS  GERD -Protonix  Hx ofHomelessness -request for wheelchair placed -Reached out to social work for confirmation that patient has stable living situationand appropriate DME at discharge, patient may be able to live with friend. Social work following.   Buttocks wound -We will continue to monitor for any changes insymptoms  Infected dialysis catheter Resolved.  Right forearm swelling Resolved.    Leftbicep pain Resolved.  FEN/GI: renal carb modified  PPx: Eliquis   Disposition: Currently planning to discharge 7/26.  Subjective:  No acute events overnight.  He is very interested in trying  to leave today.  He reports that his friend, [name], has a room at the bed within.  His friend is trying to change his room to a ground-floor room so that this can be accessed by wheelchair.  If he can get a ground-floor room, Mr. Sites would like to leave as soon as possible.  He reports that he already has transportation arranged for dialysis.  He also wanted to follow-up on whether or not he would be given a new wheelchair before he left.  Objective: Temp:  [97.4 F (36.3 C)-98.5 F (36.9 C)] 98.5 F (36.9 C) (07/25 0446) Pulse Rate:  [80-99] 82 (07/25 0446) Resp:  [14-18] 14 (07/25 0446) BP: (106-153)/(69-96) 124/81 (07/25 0446) SpO2:  [96 %-100 %] 100 % (07/25 0446) Weight:  [136.1 kg-138.8 kg] 138.8 kg (07/24 2040) Physical Exam: General: Alert and cooperative and appears to be in no acute distress.  Sitting in bed comfortably during our conversation.  Energetic and eager to leave the hospital. HEENT: Neck non-tender without lymphadenopathy, masses or thyromegaly Cardio: Normal S1 and S2, no S3 or S4. Rhythm is regular.  3/6 systolic murmur.   Pulm: Clear to auscultation bilaterally, no crackles, wheezing, or diminished breath sounds. Normal respiratory effort.  Initially had nasal cannula in place at 4 L upon entering the room.  He took this off during our conversation and remarked that it was only for comfort.  He had no trouble with shortness of breath or speaking in long sentences following removal of his nasal cannula. Abdomen: Bowel sounds normal. Abdomen soft and non-tender.  Extremities: No peripheral edema. Warm/ well perfused. Neuro: Cranial nerves grossly intact\  Laboratory: Recent Labs  Lab 07/04/20 0310 07/05/20 0436 07/06/20 0702  WBC 8.2 8.2 8.0  HGB 7.4* 7.1*  7.3*  HCT 24.7* 23.7* 24.7*  PLT 215 232 242   Recent Labs  Lab 07/04/20 0310 07/05/20 0436 07/06/20 0702  NA 134* 134* 133*  K 3.9 3.8 4.1  CL 98 97* 95*  CO2 22 26 25   BUN 52* 39* 52*  CREATININE 7.22* 5.90* 7.31*  CALCIUM 8.6* 8.5* 8.6*  GLUCOSE 181* 179* 266*      Imaging/Diagnostic Tests: No results found.  Matilde Haymaker, MD 07/07/2020, 5:28 AM PGY-3, [place] Intern pager: [phone], text pages welcome

## 2020-07-07 NOTE — Progress Notes (Signed)
DISCHARGE NOTE HOME Lucas [name] to be discharged to home per MD order. Discussed prescriptions and follow up appointments with the patient. Prescriptions given to patient; medication list explained in detail. Patient verbalized understanding.  Skin issues are as documented on this admission.. IV catheter discontinued intact. Site without signs and symptoms of complications. Dressing and pressure applied. Pt denies pain at the site currently. No complaints noted.  Patient free of lines, drains, and wounds.   An After Visit Summary (AVS) was printed and given to the patient. Patient escorted via wheelchair, and discharged home via private auto.  [place], Zenon Mayo, RN

## 2020-07-07 NOTE — Progress Notes (Signed)
Patient ID: Lucas [name], male   DOB: [birth year], 49 y.o.   MRN: [id number] S:   Tolerated hd yesterday, s/p 4L UF. Has a discharge plan. Insistent that he is leaving today. Will be going to HD Tuesday.  O:BP 110/69 (BP Location: Right Arm)   Pulse 89   Temp 99 F (37.2 C) (Oral)   Resp 18   Ht 6\' 4"  (1.93 m)   Wt (!) 138.8 kg   SpO2 100%   BMI 37.25 kg/m   Intake/Output Summary (Last 24 hours) at 07/07/2020 1503 Last data filed at 07/07/2020 9323 Gross per 24 hour  Intake 780 ml  Output 4000 ml  Net -3220 ml   Intake/Output: I/O last 3 completed shifts: In: 62 [P.O.:960] Out: 4000 [Other:4000]    Intake/Output this shift:  Total I/O In: 300 [P.O.:300] Out: -  Weight change: -0.454 kg Gen: nad, sitting up in bed CVS: no rub, s1s2, rrr Resp: normal wob Abd: distended (improved), softer now, nontender Ext: s/p bilateral BKAs with thigh edema (trace) L IJ TDC bandaged  Recent Labs  Lab 07/01/20 0031 07/02/20 0627 07/03/20 0609 07/04/20 0310 07/05/20 0436 07/06/20 0702 07/07/20 0719  NA 135 133* 134* 134* 134* 133* 135  K 3.7 4.0 3.6 3.9 3.8 4.1 3.9  CL 99 97* 96* 98 97* 95* 96*  CO2 23 23 26 22 26 25 23   GLUCOSE 202* 261* 180* 181* 179* 266* 142*  BUN 37* 49* 40* 52* 39* 52* 37*  CREATININE 5.84* 7.38* 5.80* 7.22* 5.90* 7.31* 5.76*  CALCIUM 8.6* 8.7* 8.7* 8.6* 8.5* 8.6* 8.6*   Liver Function Tests: No results for input(s): AST, ALT, ALKPHOS, BILITOT, PROT, ALBUMIN in the last 168 hours. No results for input(s): LIPASE, AMYLASE in the last 168 hours. Recent Labs  Lab 07/02/20 0627  AMMONIA 36*   CBC: Recent Labs  Lab 07/01/20 0031 07/02/20 5573 07/03/20 0609 07/03/20 0609 07/04/20 0310 07/04/20 0310 07/05/20 0436 07/06/20 0702 07/07/20 0719  WBC 8.2   < > 9.2   < > 8.2   < > 8.2 8.0 7.5  NEUTROABS 6.1  --   --   --   --   --   --   --   --   HGB 7.3*   < > 7.5*   < > 7.4*   < > 7.1* 7.3* 7.5*  HCT 24.8*   < > 25.1*   < > 24.7*   < > 23.7* 24.7*  24.8*  MCV 94.7   < > 94.4  --  94.6  --  94.4 95.0 96.1  PLT 200   < > 208   < > 215   < > 232 242 228   < > = values in this interval not displayed.   Cardiac Enzymes: Recent Labs  Lab 07/01/20 0031 07/03/20 0609 07/04/20 0310 07/07/20 0719  CKTOTAL 339 295 276 208   CBG: Recent Labs  Lab 07/06/20 1119 07/06/20 1712 07/06/20 2042 07/07/20 0640 07/07/20 1142  GLUCAP 230* 210* 236* 188* 178*    Iron Studies:  No results for input(s): IRON, TIBC, TRANSFERRIN, FERRITIN in the last 72 hours. Studies/Results: No results found. . sodium chloride   Intravenous Once  . sodium chloride   Intravenous Once  . acetaminophen  1,000 mg Oral Q6H WA  . amiodarone  200 mg Oral Daily  . amLODipine  5 mg Oral Daily  . apixaban  5 mg Oral BID  . ARIPiprazole  5 mg Oral Daily  .  carvedilol  12.5 mg Oral BID  . Chlorhexidine Gluconate Cloth  6 each Topical Q0600  . Chlorhexidine Gluconate Cloth  6 each Topical Q0600  . darbepoetin (ARANESP) injection - DIALYSIS  200 mcg Intravenous Q Sat-HD  . diclofenac Sodium  4 g Topical QID  . doxercalciferol  4 mcg Oral Q T,Th,Sa-HD  . gabapentin  100 mg Oral QHS  . hydrALAZINE  100 mg Oral TID  . insulin aspart  0-15 Units Subcutaneous TID WC  . insulin aspart  0-5 Units Subcutaneous QHS  . insulin glargine  25 Units Subcutaneous Daily  . lidocaine  1 patch Transdermal Q24H  . linezolid  600 mg Oral Q12H  . melatonin  5 mg Oral QHS  . nystatin   Topical BID  . nystatin cream   Topical BID  . pantoprazole  40 mg Oral Daily  . polyethylene glycol  17 g Oral Daily  . QUEtiapine  100 mg Oral QHS  . sevelamer carbonate  1,600 mg Oral TID with meals  . sodium chloride flush  10-40 mL Intracatheter Q12H  . tamsulosin  0.4 mg Oral Daily    BMET    Component Value Date/Time   NA 135 07/07/2020 0719   K 3.9 07/07/2020 0719   CL 96 (L) 07/07/2020 0719   CO2 23 07/07/2020 0719   GLUCOSE 142 (H) 07/07/2020 0719   BUN 37 (H) 07/07/2020 0719    CREATININE 5.76 (H) 07/07/2020 0719   CALCIUM 8.6 (L) 07/07/2020 0719   GFRNONAA 11 (L) 07/07/2020 0719   GFRAA 12 (L) 07/07/2020 0719   CBC    Component Value Date/Time   WBC 7.5 07/07/2020 0719   RBC 2.58 (L) 07/07/2020 0719   HGB 7.5 (L) 07/07/2020 0719   HCT 24.8 (L) 07/07/2020 0719   PLT 228 07/07/2020 0719   MCV 96.1 07/07/2020 0719   MCH 29.1 07/07/2020 0719   MCHC 30.2 07/07/2020 0719   RDW 19.2 (H) 07/07/2020 0719   LYMPHSABS 0.7 07/01/2020 0031   MONOABS 1.1 (H) 07/01/2020 0031   EOSABS 0.2 07/01/2020 0031   BASOSABS 0.0 07/01/2020 0031   Outpatient: [facility] [place] [facility]) Phone [phone] Scheduled TTS 4 hours His outpatient unit reports noncompliance BF 400; DF 800 F250 dialyzer  2/2.5 ca bath EDW 250 lbs Last weight 272.2 lbs on 6/24 (signed off early and had skipped the treatment prior) aranesp 120 mcg weekly  hectoral 4 mcg each tx  Assessment/Plan:  1. Enterococcus bacteremia/Mitral valve Endocarditis- due to tunneled HD catheter. TDC removed 6/29 s/p temp HD catheter placed 06/14/20.Now with new LIJ [place] placed 7/6. Will continue withIVcubicin andoralzyvoxper ID 1. Not candidate for surgery due to multiple co-morbidities, social issues, and polysubstance abuse 2. Noted vegetation on TTE 6/29 3. daptomycin through 07/22/20 and oral linezolid for 4 weeks (his home HD unit does have daptomycin). Check ck with dapto. 4. Follow up with ID on 07/08/20 2. ESRDcontinue with TTS schedule; catheter replaced by IR on 06/18/20. 1. TTS schedule while here - Usual HD on schedule:  4h, 4-5L UF, 2K. Tight heparin. LIJ TDC.  2. maintain TTS schedule 3. Reinforced adherence/compliance to dialysis treatments 4. Fluid restriction advised 5. Renal diet 3. Anemia:due to ESRD. Continue with ESA. increased dose, will be receiving max dose qweekly. Transfusion per primary. ASx currently.  7/9 TSAT 16% and ferritin 561 holding IV Fe w/  #1 4. CKD-MBD:continue with home meds. Monitor phos. 5. Nutrition:renal diet 6. Hypertension:BPs stable, CTM 7.  Vascular access-s/pL IJ tunneled HD catheter per IR7/6/21. 8. Disposition- apparently being discharged today, has a discharge plan. Dialysis Tuesday  Gean Quint, MD [facility]

## 2020-07-07 NOTE — Discharge Instructions (Signed)
Dear Lucas [name],   Thank you so much for allowing [place] to be part of your care!  You were admitted to [facility] for a severe infection of your heart valves and within your bloodstream.  Due to this, you need to complete 6 weeks of IV antibiotics to treat it. IT IS OF THE UTMOST IMPORTANCE THAT YOU CONTINUE THIS THERAPY.  While we encourage you to stay, we understand you no longer want to stay in the hospital to receive this treatment, but you absolutely must follow-up with hemodialysis to receive your daptomycin there.    POST-HOSPITAL & CARE INSTRUCTIONS 1. Call your hemodialysis center first thing tomorrow morning to work this out. 2. Please let PCP/Specialists know of any changes that were made.  3. Please see medications section of this packet for any medication changes.   DOCTOR'S APPOINTMENT & FOLLOW UP CARE INSTRUCTIONS  Future Appointments  Date Time Provider [place]  07/18/2020  9:00 AM Blythedale Callas, NP [facility]    RETURN PRECAUTIONS:   Take care and be well!  [facility]  [place], [place] [address] [phone]

## 2020-07-08 ENCOUNTER — Telehealth: Payer: Medicaid Other | Admitting: Internal Medicine

## 2020-07-10 ENCOUNTER — Encounter (HOSPITAL_COMMUNITY): Payer: Self-pay | Admitting: *Deleted

## 2020-07-10 ENCOUNTER — Emergency Department (HOSPITAL_COMMUNITY)
Admission: EM | Admit: 2020-07-10 | Discharge: 2020-07-10 | Disposition: A | Discharge status: Home / Self Care | Payer: Medicaid Other | Attending: Emergency Medicine | Admitting: Emergency Medicine

## 2020-07-10 ENCOUNTER — Emergency Department (HOSPITAL_COMMUNITY): Payer: Medicaid Other

## 2020-07-10 ENCOUNTER — Other Ambulatory Visit: Payer: Self-pay

## 2020-07-10 DIAGNOSIS — Z20822 Contact with and (suspected) exposure to covid-19: Secondary | ICD-10-CM | POA: Insufficient documentation

## 2020-07-10 DIAGNOSIS — Z794 Long term (current) use of insulin: Secondary | ICD-10-CM | POA: Diagnosis not present

## 2020-07-10 DIAGNOSIS — R0789 Other chest pain: Secondary | ICD-10-CM | POA: Diagnosis not present

## 2020-07-10 DIAGNOSIS — Z992 Dependence on renal dialysis: Secondary | ICD-10-CM | POA: Diagnosis not present

## 2020-07-10 DIAGNOSIS — R0602 Shortness of breath: Secondary | ICD-10-CM | POA: Diagnosis not present

## 2020-07-10 DIAGNOSIS — N186 End stage renal disease: Secondary | ICD-10-CM | POA: Diagnosis not present

## 2020-07-10 DIAGNOSIS — E1165 Type 2 diabetes mellitus with hyperglycemia: Secondary | ICD-10-CM | POA: Diagnosis not present

## 2020-07-10 DIAGNOSIS — Z79899 Other long term (current) drug therapy: Secondary | ICD-10-CM | POA: Diagnosis not present

## 2020-07-10 DIAGNOSIS — Z9289 Personal history of other medical treatment: Secondary | ICD-10-CM

## 2020-07-10 LAB — CBC
HCT: 25.8 % — ABNORMAL LOW (ref 39.0–52.0)
Hemoglobin: 8.1 g/dL — ABNORMAL LOW (ref 13.0–17.0)
MCH: 29.3 pg (ref 26.0–34.0)
MCHC: 31.4 g/dL (ref 30.0–36.0)
MCV: 93.5 fL (ref 80.0–100.0)
Platelets: 312 10*3/uL (ref 150–400)
RBC: 2.76 MIL/uL — ABNORMAL LOW (ref 4.22–5.81)
RDW: 19.5 % — ABNORMAL HIGH (ref 11.5–15.5)
WBC: 8.2 10*3/uL (ref 4.0–10.5)
nRBC: 0 % (ref 0.0–0.2)

## 2020-07-10 LAB — TROPONIN I (HIGH SENSITIVITY)
Troponin I (High Sensitivity): 54 ng/L — ABNORMAL HIGH (ref <18)
Troponin I (High Sensitivity): 54 ng/L — ABNORMAL HIGH (ref <18)

## 2020-07-10 LAB — BASIC METABOLIC PANEL
Anion gap: 16 — ABNORMAL HIGH (ref 5–15)
BUN: 80 mg/dL — ABNORMAL HIGH (ref 6–20)
CO2: 22 mmol/L (ref 22–32)
Calcium: 8.4 mg/dL — ABNORMAL LOW (ref 8.9–10.3)
Chloride: 96 mmol/L — ABNORMAL LOW (ref 98–111)
Creatinine, Ser: 9.35 mg/dL — ABNORMAL HIGH (ref 0.61–1.24)
GFR calc Af Amer: 7 mL/min — ABNORMAL LOW (ref >60)
GFR calc non Af Amer: 6 mL/min — ABNORMAL LOW (ref >60)
Glucose, Bld: 276 mg/dL — ABNORMAL HIGH (ref 70–99)
Potassium: 4.7 mmol/L (ref 3.5–5.1)
Sodium: 134 mmol/L — ABNORMAL LOW (ref 135–145)

## 2020-07-10 LAB — SARS CORONAVIRUS 2 BY RT PCR (DIASORIN): SARS Coronavirus 2: NEGATIVE

## 2020-07-10 MED ORDER — INSULIN ASPART 100 UNIT/ML ~~LOC~~ SOLN: 0.0000 [IU] | Freq: Three times a day (TID) | SUBCUTANEOUS | Status: DC

## 2020-07-10 MED ORDER — PANTOPRAZOLE SODIUM 40 MG PO TBEC
40.0000 mg | DELAYED_RELEASE_TABLET | Freq: Every day | ORAL | Status: DC
  Administered 2020-07-10: 40 mg via ORAL
  Filled 2020-07-10: qty 1

## 2020-07-10 MED ORDER — LINEZOLID 600 MG PO TABS
600.0000 mg | ORAL_TABLET | Freq: Two times a day (BID) | ORAL | Status: DC
  Filled 2020-07-10: qty 1

## 2020-07-10 MED ORDER — SODIUM CHLORIDE 0.9% FLUSH: 3.0000 mL | Freq: Once | INTRAVENOUS | Status: DC

## 2020-07-10 MED ORDER — CARVEDILOL 12.5 MG PO TABS: 12.5000 mg | ORAL_TABLET | Freq: Two times a day (BID) | ORAL | Status: DC

## 2020-07-10 MED ORDER — AMLODIPINE BESYLATE 5 MG PO TABS
10.0000 mg | ORAL_TABLET | Freq: Every day | ORAL | Status: DC
  Administered 2020-07-10: 10 mg via ORAL
  Filled 2020-07-10: qty 2

## 2020-07-10 MED ORDER — AMIODARONE HCL 200 MG PO TABS
200.0000 mg | ORAL_TABLET | Freq: Every day | ORAL | Status: DC
  Administered 2020-07-10: 200 mg via ORAL
  Filled 2020-07-10: qty 1

## 2020-07-10 MED ORDER — SODIUM CHLORIDE 0.9 % IV SOLN: 800.0000 mg | INTRAVENOUS | Status: DC

## 2020-07-10 MED ORDER — HYDRALAZINE HCL 50 MG PO TABS: 100.0000 mg | ORAL_TABLET | Freq: Three times a day (TID) | ORAL | Status: DC

## 2020-07-10 MED ORDER — INSULIN ASPART 100 UNIT/ML ~~LOC~~ SOLN: 2.0000 [IU] | Freq: Three times a day (TID) | SUBCUTANEOUS | Status: DC

## 2020-07-10 NOTE — Progress Notes (Signed)
Patient well known to Renal Navigator. Navigator notes that patient is in ED for missed HD yesterday, though all was confirmed during 1 month admission (discharged 07/07/20) that patient had a home to go to in [place] (as he continually states that he is moving in with a friend "[name]" in [place], but has never given an address, and transportation to [facility] in [place] confirmed by [employer] provided by [name].  Navigator met with patient in ED and worked on problem solving for over two hours as he states he did not have transportation to HD yesterday. It is even more imperative that patient go to HD as scheduled since he needs abx through August 9th. He had previously been hospitalized with the concern that he would not follow up with OP HD given hx of significant non-compliance, but was discharged over the weekend.  Navigator contacted [employer] and asked why he was not picked up as confirmed with [facility] prior to patient's discharge. [employer] Coordinator reports that patient threatened one of their drivers and he is banned from their company. Navigator called [facility] and no solution was offered other than to see if Non-Medicaid Transportation would assist. Navigator asked that Supervisor/Ms. Bell call Navigator back to discuss.  Inpatient HD unit is extremely full today and per Nephrology, patient is safe to wait for regularly scheduled HD tomorrow. His seat is at 11:00am and he needs to be there at 10:40am. We can provide him with an [place] Popper from ED to OP HD clinic as long as he is kept overnight in the ED. EDP agrees with plan.  As far as ongoing transportation to OP HD, Navigator suggests, again, that given that he is looking for different housing (reason unknown), it is strongly recommended that that he look in [place]. He is unwilling. Navigator states the ongoing transportation issues from [facility] to [facility] and the inability to get HD clinic changed to [facility] given hx of non-compliance. Patient lacks insight. Navigator spoke with patient about the option of PART bus to get from either [place] to [place] or [employer] to [place]. He is agreeable to this and states he will be able to purchase a PART pass once he gets his check on the 1st (thinks it will come by Friday since the 1st is over the weekend). He asks Navigator for money today-refused. He called a friend to have money wired to him and states his friend will help him. Navigator informed patient of ride provided to HD tomorrow from ED at no cost to him. If he can provide an address, we can provide him with a ride home also. He should be able to ride [employer] bus in [place]) once he arrives at the [facility] in [place], but may need to pay for a ride to get to the [place]. Navigator asked how he plans to get from cab to bus to bus (after tomorrow's ride provided) and patient states no concerns. He states he uses upper body strength and has no problems transferring in to a regular car.  Navigator spoke at length with ED team, Nephrology, Case Manager/Stephanie C and patient's OP HD Social Worker/[name]. It was decided that patient would stay overnight in the ED to be transported straight to OP HD clinic in [place] on Thursday 7/29. Renal Navigator will arrange transport. Case Manager to provide PART bus tickets for his first few rides. Navigator has asked  for assistance from [place] if there is any follow up she can provide to ensure that patient remains compliant with plan.   Alphonzo Cruise, [place] Renal Navigator [phone]

## 2020-07-10 NOTE — Discharge Instructions (Addendum)
At this time there does not appear to be the presence of an emergent medical condition, however there is always the potential for conditions to change. Please read and follow the below instructions.  Please return to the Emergency Department immediately for any new or worsening symptoms. Please be sure to follow up with your Primary Care Provider within one week regarding your visit today; please call their office to schedule an appointment even if you are feeling better for a follow-up visit. Go to your dialysis appointment tomorrow morning as scheduled.  Get help right away if: You have a cough that gets worse, or you cough up blood. You have very bad (severe) pain in your belly (abdomen). You pass out (faint). You have either of these for no clear reason: Sudden chest discomfort. Sudden discomfort in your arms, back, neck, or jaw. You have shortness of breath at any time. You suddenly start to sweat, or your skin gets clammy. You feel sick to your stomach (nauseous). You throw up (vomit). You suddenly feel lightheaded or dizzy. You feel very weak or tired. Your heart starts to beat fast, or it feels like it is skipping beats. You have any new/concerning or worsening of symptoms These symptoms may be an emergency. Do not wait to see if the symptoms will go away. Get medical help right away. Call your local emergency services (911 in the [place] Do not drive yourself to the hospital.  Please read the additional information packets attached to your discharge summary.  Do not take your medicine if  develop an itchy rash, swelling in your mouth or lips, or difficulty breathing; call 911 and seek immediate emergency medical attention if this occurs.  You may review your lab tests and imaging results in their entirety on your MyChart account.  Please discuss all results of fully with your primary care provider and other specialist at your follow-up visit.  Note: Portions of this text may have  been transcribed using voice recognition software. Every effort was made to ensure accuracy; however, inadvertent computerized transcription errors may still be present.

## 2020-07-10 NOTE — Care Management (Addendum)
Spoke to [employer]  CSW Renal at length regarding patient.Plan discussed with Rn and MDPA . The plan will be to have him be observed in the ED overnight and transport him via [employer]/ uber in AM [place] has checked with him to ensure he has no problem getting in and out of his wheelchair as he is bilateral BKA. Patient is complex social work as he has been terminated from transport over threatening behavior and now must rely on public transportation to get to dialysis . Currently resides in [place], but will be moving to [place] , closer to the center, [facility], so his residency is often scattered. He does get antibiotic therapy IV until August 9th so he has been instructed to make sure to not miss dialysis. Bus pass will be given to patient. Will need to leave the ED in Am by 1000 to get to dialysis in [facility].

## 2020-07-10 NOTE — ED Triage Notes (Signed)
[facility]. Per report, Dialysis on Saturday. Normal dialysis center is in [place] and he does not have a ride to get there, so he called 911. T/TH/Saturday dialysis. 143/85, hr 88, r 18, 91% RA.

## 2020-07-10 NOTE — Care Management (Signed)
ED CM reviewed record and noted that patient is schedule for HD tomorrow in [place] but does not have transportation to HD, and patient has missed several tx due to lack of transportation.  Arrangements were made for patient to stay overnight and be picked up [employer] at 10:00am as per TOC note.

## 2020-07-10 NOTE — Progress Notes (Signed)
Lucas [name] [birth year] [id number]   Patient seen and examined in the ED.  Recent month long admission.  Missed dialysis yesterday due to transportation issue.  K 4.7.  Blood pressure currently well controlled, O2 sat 100% on RA.  No acute indication for dialysis tonight and with high patient census we would not be able to dialyze him until some time tomorrow.  Renal navigator has transportation arranged for patient to outpatient dialysis tomorrow morning.  Plan to keep patient in the ED overnight and transport to dialysis in the AM.  Per patient he has plan for transportation to future dialysis appointments.  If patient becomes inpatient will complete full consult.    Jen Mow, PA-C [facility] Pager: [phone]

## 2020-07-10 NOTE — ED Notes (Signed)
Handed off EKG to Dr. Peggye Pitt who signed off as No stemi, would not register on EKG machine. Machine has since been reset

## 2020-07-10 NOTE — ED Triage Notes (Signed)
Pt says he missed his dialysis. Has been having CP and Shortness of breath tonight.

## 2020-07-10 NOTE — TOC Transition Note (Signed)
Transition of Care [facility]) - CM/SW Discharge Note   Patient Details  Name: Lucas [name] MRN: [id number] Date of Birth: 24-Jun-1971  [facility] CM/SW Contact: [place] Laurena Slimmer, RN Phone Number: 07/10/2020, 7:50 PM   Clinical Narrative:             Patient Goals and CMS Choice        Discharge Placement                       Discharge Plan and Services    CM spoke with EDP on [place] concerning patient having to board in the ED for transport to HD in [place] in the morning. CM attempted to contact HD Coordinator Terri Piedra to discuss details of transitional care plan.  EDP  asked about arranging transportation from home to HD tomorrow morning, instead of patient staying in the ED overnight..  ED CM met with patient who states he can stay with a friend in [place] if we could arrange transportation from there in the am for chair time at 10am.  Patient states he does not want to stay overnight in the ED.Marland Kitchen CM contacted [facility] transportation spoke with Supervisor T'Vell who arrangde for patient to be picked up at requested address.  [place] in the morning at 9:00 am. Patient will be going to [place] HD [place]. [place] for chair time of 10:00. Updated EDP, patient will discharged form the ED transported to the requested address via [facility] trans portion.                                  Social Determinants of Health (SDOH) Interventions     Readmission Risk Interventions No flowsheet data found.

## 2020-07-10 NOTE — ED Notes (Signed)
HD coordinator continues working to get pt to HD in [place] if possible.

## 2020-07-10 NOTE — ED Notes (Signed)
Reassessed patient.  Pt was sleeping in wheelchair.  Easily aroused.  Pt is aware of plan of care to get him into ED room.  Pt states last HD was Saturday.

## 2020-07-10 NOTE — ED Provider Notes (Signed)
[facility] EMERGENCY DEPARTMENT Provider Note   CSN: [id number] Arrival date & time: 07/10/20  5498     History Chief Complaint  Patient presents with  . Dialysis    Lucas [name] is a 49 y.o. male history ESRD, diabetes, A. fib on Eliquis.  Patient presents today for chest pain shortness of breath and dialysis. Patient reports that he left the hospital over the weekend after a prolonged stay, he is currently being treated outpatient for bacteremia/endocarditis and receiving infusions of daptomycin and on oral linezolid.  Patient reports that his last dialysis session was Saturday, 07/06/2020. He reports that he was supposed to go dialysis yesterday 07/09/2020 but he cannot find a ride to [place]. He reports that over the past 2 days he has developed shortness of breath as a feeling of too much fluid and needing dialysis. He also describes chest pain as a mild central burning sensation constant no clear aggravating or alleviating factors no radiation of pain. He reports that he feels he needs dialysis today and is concerned that he does not have a ride to his future dialysis sessions.  He denies fever/chills, vomiting, diarrhea, cough/hemoptysis, ABD pain, fall/injury or any additional concerns.  HPI     Past Medical History:  Diagnosis Date  . Atrial fibrillation ([place])   . Diabetes mellitus without complication ([place])   . Renal disorder    end stage    Patient Active Problem List   Diagnosis Date Noted  . Abnormal liver CT   . Cirrhosis ([place])   . Ascites   . Scrotal swelling   . Atypical chest pain   . Infection   . Shoulder pain   . Central line infection 06/14/2020  . DM (diabetes mellitus), type 2 with renal complications ([place]) 26/41/5830  . 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 Bacteremia due to Enterococcus 06/10/2020  . Anemia associated with stage 5 chronic renal failure ([place])   . ESRD on hemodialysis ([place])   . Hyperglycemia due to  diabetes mellitus ([place]) 06/09/2020    Past Surgical History:  Procedure Laterality Date  . BELOW KNEE LEG AMPUTATION Bilateral   . IR FLUORO GUIDE CV LINE LEFT  06/14/2020  . IR FLUORO GUIDE CV LINE LEFT  06/18/2020  . IR PARACENTESIS  07/03/2020  . IR REMOVAL TUN CV CATH W/O FL  06/11/2020  . IR US GUIDE VASC ACCESS LEFT  06/14/2020  . IR US GUIDE VASC ACCESS LEFT  06/18/2020  . TEE WITHOUT CARDIOVERSION N/A 06/13/2020   Procedure: TRANSESOPHAGEAL ECHOCARDIOGRAM (TEE);  Surgeon: Sanda Klein, MD;  Location: [facility] ENDOSCOPY;  Service: Cardiovascular;  Laterality: N/A;       No family history on file.  Social History   Tobacco Use  . Smoking status: Never Smoker  . Smokeless tobacco: Never Used  Substance Use Topics  . Alcohol use: Not on file  . Drug use: Not on file    Home Medications Prior to Admission medications   Medication Sig Start Date End Date Taking? Authorizing Provider  amiodarone (PACERONE) 200 MG tablet Take 200 mg by mouth daily. 04/29/20  Yes [provider]  amLODipine (NORVASC) 10 MG tablet Take 10 mg by mouth daily. 03/25/20  Yes [provider]  ARIPiprazole (ABILIFY) 5 MG tablet Take 5 mg by mouth daily. 04/16/20  Yes [provider]  atorvastatin (LIPITOR) 40 MG tablet Take 1 tablet (40 mg total) by mouth daily. 05/22/20   Milton Ferguson, MD  bumetanide Cleda Clarks) 2  MG tablet Take 2 mg by mouth See admin instructions. Taking 2 mg twice daily except on HD days, Tues, Thurs, and Saturday not taking. 04/28/20   [provider]  carvedilol (COREG) 12.5 MG tablet Take 1 tablet (12.5 mg total) by mouth 2 (two) times daily with a meal. 05/22/20   Milton Ferguson, MD  DAPTOmycin 1,000 mg in sodium chloride 0.9 % 100 mL Inject 1,000 mg into the vein every other day. 07/07/20   Patriciaann Clan, DO  ELIQUIS 5 MG TABS tablet Take 5 mg by mouth 2 (two) times daily. 04/10/20   [provider]  gabapentin (NEURONTIN) 100 MG capsule Take 1 capsule  (100 mg total) by mouth at bedtime. 07/07/20   Patriciaann Clan, DO  HUMALOG 100 UNIT/ML injection Inject 2-12 Units into the skin 3 (three) times daily before meals. Per sliding scale:  BG 180-200= 2 units, 201-250= 5 units, 251-300= 7 units, 301-350= 10 units, 351-400 = 12 units, 400 Call MD 02/21/20   [provider]  hydrALAZINE (APRESOLINE) 100 MG tablet Take 100 mg by mouth 3 (three) times daily. 04/29/20   [provider]  insulin glargine (LANTUS) 100 UNIT/ML injection Inject 0.25 mLs (25 Units total) into the skin at bedtime. 07/07/20   Patriciaann Clan, DO  Insulin Pen Needle 29G X 12.7MM MISC 30 Containers by Does not apply route daily. 05/22/20   Milton Ferguson, MD  linezolid (ZYVOX) 600 MG tablet Take 1 tablet (600 mg total) by mouth every 12 (twelve) hours for 7 doses. 07/07/20 07/11/20  Patriciaann Clan, DO  pantoprazole (PROTONIX) 40 MG tablet Take 40 mg by mouth daily. 02/06/20   [provider]  polyethylene glycol (MIRALAX / GLYCOLAX) 17 g packet Take 17 g by mouth daily. 07/08/20   Patriciaann Clan, DO  QUEtiapine (SEROQUEL) 200 MG tablet Take 200 mg by mouth at bedtime. 03/25/20   [provider]  sevelamer carbonate (RENVELA) 800 MG tablet Take 1,600 mg by mouth 3 (three) times daily. 02/06/20   [provider]  tamsulosin (FLOMAX) 0.4 MG CAPS capsule Take 0.4 mg by mouth daily. 03/25/20   [provider]    Allergies    Patient has no known allergies.  Review of Systems   Review of Systems Ten systems are reviewed and are negative for acute change except as noted in the HPI  Physical Exam Updated Vital Signs BP (!) 145/110   Pulse 87   Temp 98.3 F (36.8 C) (Oral)   Resp 14   SpO2 98%   Physical Exam Constitutional:      General: He is not in acute distress.    Appearance: Normal appearance. He is well-developed. He is obese. He is not ill-appearing or diaphoretic.  HENT:     Head: Normocephalic and atraumatic.    Eyes:     General: Vision grossly intact. Gaze aligned appropriately.     Pupils: Pupils are equal, round, and reactive to light.  Neck:     Trachea: Trachea and phonation normal.  Cardiovascular:     Rate and Rhythm: Normal rate and regular rhythm.  Pulmonary:     Effort: Pulmonary effort is normal. No respiratory distress.  Abdominal:     General: There is no distension.     Palpations: Abdomen is soft.     Tenderness: There is no abdominal tenderness. There is no guarding or rebound.  Musculoskeletal:        General: Normal range of motion.  Cervical back: Normal range of motion.     Right Lower Extremity: Right leg is amputated below knee.     Left Lower Extremity: Left leg is amputated below knee.  Skin:    General: Skin is warm and dry.  Neurological:     Mental Status: He is alert.     GCS: GCS eye subscore is 4. GCS verbal subscore is 5. GCS motor subscore is 6.     Comments: Speech is clear and goal oriented, follows commands Major Cranial nerves without deficit, no facial droop Moves extremities without ataxia, coordination intact  Psychiatric:        Behavior: Behavior normal.     ED Results / Procedures / Treatments   Labs (all labs ordered are listed, but only abnormal results are displayed) Labs Reviewed  BASIC METABOLIC PANEL - Abnormal; Notable for the following components:      Result Value   Sodium 134 (*)    Chloride 96 (*)    Glucose, Bld 276 (*)    BUN 80 (*)    Creatinine, Ser 9.35 (*)    Calcium 8.4 (*)    GFR calc non Af Amer 6 (*)    GFR calc Af Amer 7 (*)    Anion gap 16 (*)    All other components within normal limits  CBC - Abnormal; Notable for the following components:   RBC 2.76 (*)    Hemoglobin 8.1 (*)    HCT 25.8 (*)    RDW 19.5 (*)    All other components within normal limits  TROPONIN I (HIGH SENSITIVITY) - Abnormal; Notable for the following components:   Troponin I (High Sensitivity) 54 (*)    All other components  within normal limits  TROPONIN I (HIGH SENSITIVITY) - Abnormal; Notable for the following components:   Troponin I (High Sensitivity) 54 (*)    All other components within normal limits  SARS CORONAVIRUS 2 BY RT PCR (DIASORIN)    EKG EKG Interpretation  Date/Time:  Wednesday July 10 2020 09:50:48 EDT Ventricular Rate:  84 PR Interval:    QRS Duration: 105 QT Interval:  415 QTC Calculation: 491 R Axis:   -21 Text Interpretation: Sinus rhythm Borderline left axis deviation Abnormal T, consider ischemia, lateral leads No STEMI Confirmed by Octaviano Glow [phone]) on 07/10/2020 10:07:13 AM   Radiology DG Chest 2 View  Result Date: 07/10/2020 CLINICAL DATA:  Chest pain EXAM: CHEST - 2 VIEW COMPARISON:  06/09/2020 FINDINGS: Cardiac shadow is enlarged but stable. Dialysis catheter has been exchanged from the right to the left. It remains in satisfactory position. Inspiratory effort is poor. Mild central vascular congestion is noted. No bony abnormality is noted. IMPRESSION: Central vascular congestion accentuated by poor inspiratory technique. No other focal abnormality is noted. Electronically Signed   By: Inez Catalina M.D.   On: 07/10/2020 03:04    Procedures Procedures (including critical care time)  Medications Ordered in ED Medications  sodium chloride flush (NS) 0.9 % injection 3 mL (3 mLs Intravenous Not Given 07/10/20 1010)  DAPTOmycin (CUBICIN) 800 mg in sodium chloride 0.9 % IVPB (has no administration in time range)  amiodarone (PACERONE) tablet 200 mg (200 mg Oral Given 07/10/20 1810)  amLODipine (NORVASC) tablet 10 mg (10 mg Oral Given 07/10/20 1809)  carvedilol (COREG) tablet 12.5 mg (has no administration in time range)  insulin aspart (novoLOG) injection 2-12 Units (has no administration in time range)  hydrALAZINE (APRESOLINE) tablet 100 mg (has no administration in  time range)  pantoprazole (PROTONIX) EC tablet 40 mg (40 mg Oral Given 07/10/20 1809)  linezolid (ZYVOX)  tablet 600 mg (has no administration in time range)  insulin aspart (novoLOG) injection 0-6 Units (has no administration in time range)    ED Course  I have reviewed the triage vital signs and the nursing notes.  Pertinent labs & imaging results that were available during my care of the patient were reviewed by me and considered in my medical decision making (see chart for details).  Clinical Course as of Jul 10 1836  Wed Jul 10, 2020  1019 Posey Pronto   [BM]  78 49 yo male w/ ESRD presenting by EMS with missed dialysis, presenting from home for dialysis.  He has been living at [facility] and was not picked up for dialysis at [place].  In the Ed today he complains of some shortness of breath and is somnolent on my exam.  He is not hypoxic.  He is on room air.  He has no hyperK.  His ECG is nonischemic and his trop is flat here.  Doubt ACS or PE.  While I do not think he requires emergent dialysis or hospitalization at this moment, with his difficult social living situation, I do think it would be helpful to get transportation arranged and dialysis today if possible.  We are talking to CM team about the possibility of arranging transport for dialysis today   [MT]    Clinical Course User Index [BM] Deliah Boston, PA-C [MT] Langston Masker Carola Rhine, MD   MDM Rules/Calculators/A&P                          Additional history obtained from: 1. Nursing notes from this visit. 2. Electronic medical record reviewed. Reviewed patient's discharge summary from most recent admission. 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 ------------------------------------ I ordered, reviewed and interpreted labs which include: COVID test negative. CBC shows hemoglobin 8.1 which appears baseline, no leukocytosis. BMP shows elevated creatinine and BUN consistent with ESRD. Potassium 4.7. Gap 16. CO2 wnl. Initial and Delta Troponin is 54. Flat, reassuring no  indication for third troponin.  CXR:  IMPRESSION:  Central vascular congestion accentuated by poor inspiratory  technique. No other focal abnormality is noted.   EKG: Sinus rhythm Borderline left axis deviation Abnormal T, consider ischemia, lateral leads No STEMI Confirmed by Octaviano Glow [phone]) on 07/10/2020 10:07:13 AM ---------------------- 10:19 AM: Discussed case with nephrologist Dr. Posey Pronto, advises that there will likely not be any openings for several hours or potentially the rest of the day here for hemodialysis.  Advises that if dialysis coordinator can find transportation to his dialysis center that would be good.  If not we will need to admit to hospitalist service. ----- 1:26 PM: Social work and renal coordinator have worked extensively to find patient transportation and dialysis center today.  Plan of care currently is that patient hold in the ED overnight and then go to [facility] for his regular dialysis session tomorrow morning.  At this time does not appear to be an emergent need for dialysis or admission.  I have ordered patient's home medications including daptomycin.  Patient will be monitored in the ER. Nursing staff made aware. ---- 2:45 PM: Patient reassessment, he is sitting up on the side of the bed he has just finished eating his lunch is in no acute distress.  He reports that his lunch tasted  well.  Vital signs stable on room air.  He states understanding of care plan he is agreeable for overnight observation until he can go to his dialysis clinic in [place] tomorrow morning.  He has no questions or concerns at this time. - 6:35 PM: Patient reassessed he is fully dressed sitting in his wheelchair in the hallway of [facility].  He reports that he is feeling well and he just finished dinner which he enjoyed.  He has no current complaints or pain.  He reports that social work is currently trying to find him somewhere to stay in [facility] and then to arrange  transportation to his dialysis center in the morning.  He denies any concerns at this time.  He reports he would like to leave. - 6:40 PM: Mariann Laster RN reports patient now has a place to stay in [facility] and she has arranged transport for him tomorrow morning.  There is no indication to keep patient in the ER any further, no indication for emergent dialysis.  Patient strongly encouraged to go to his dialysis appointment tomorrow.  Return precautions discussed.   At this time there does not appear to be any evidence of an acute emergency medical condition and the patient appears stable for discharge with appropriate outpatient follow up. Diagnosis was discussed with patient who verbalizes understanding of care plan and is agreeable to discharge. I have discussed return precautions with patient who verbalizes understanding. Patient encouraged to follow-up with their PCP. All questions answered.  Patient's case discussed with Dr. Billy Fischer, attending after shift change, agrees with discharge.  Note: Portions of this report may have been transcribed using voice recognition software. Every effort was made to ensure accuracy; however, inadvertent computerized transcription errors may still be present. Final Clinical Impression(s) / ED Diagnoses Final diagnoses:  Atypical chest pain  History of renal dialysis    Rx / DC Orders ED Discharge Orders    None       Gari Crown 07/10/20 1843    Wyvonnia Dusky, MD 07/11/20 [phone]

## 2020-07-10 NOTE — TOC Progression Note (Signed)
Transition of Care [facility]) - Progression Note    Patient Details  Name: Lucas [name] MRN: [id number] Date of Birth: [birth year]  [facility] CM/SW [place], RN Phone Number: 07/10/2020, 3:34 PM  Clinical Christiana Fuchs will make transportation arrangements in the AM for patient. To go to dialysis the PATIENT MUST BE READY FOR TRANSPORT AT 0945        Expected Discharge Plan and Services                                                 Social Determinants of Health (SDOH) Interventions    Readmission Risk Interventions No flowsheet data found.

## 2020-07-11 ENCOUNTER — Telehealth: Payer: Self-pay | Admitting: Surgery

## 2020-07-11 NOTE — Telephone Encounter (Signed)
ED CM received call this am from transportation driver that when they arrived to pick patient up, they called  contact person"Blue" and was made aware that patient was taken to [facility] earlier in the am.

## 2020-07-16 LAB — OXYCODONES,MS,WB/SP RFX
Oxycocone: 7.2 ng/mL
Oxycodones Confirmation: POSITIVE
Oxymorphone: NEGATIVE ng/mL

## 2020-07-16 LAB — DRUG SCREEN 10 W/CONF, SERUM
Amphetamines, IA: NEGATIVE ng/mL
Barbiturates, IA: NEGATIVE ug/mL
Benzodiazepines, IA: NEGATIVE ng/mL
Cocaine & Metabolite, IA: NEGATIVE ng/mL
Methadone, IA: NEGATIVE ng/mL
Opiates, IA: POSITIVE ng/mL — AB
Oxycodones, IA: POSITIVE ng/mL — AB
Phencyclidine, IA: NEGATIVE ng/mL
Propoxyphene, IA: NEGATIVE ng/mL
THC(Marijuana) Metabolite, IA: NEGATIVE ng/mL

## 2020-07-16 LAB — AMPHETAMINES/MDMA,MS,WB/SP RFX
Amphetamine: NEGATIVE ng/mL
Amphetamines Confirmation: NEGATIVE
MDA: NEGATIVE ng/mL
MDEA: NEGATIVE ng/mL
MDMA: NEGATIVE ng/mL
Methamphetamine: NEGATIVE ng/mL

## 2020-07-16 LAB — THC,MS,WB/SP RFX
Cannabidiol: NEGATIVE ng/mL
Cannabinoid Confirmation: NEGATIVE
Cannabinol: NEGATIVE ng/mL
Carboxy-THC: NEGATIVE ng/mL
Hydroxy-THC: NEGATIVE ng/mL
Tetrahydrocannabinol(THC): NEGATIVE ng/mL

## 2020-07-16 LAB — OPIATES,MS,WB/SP RFX
Codeine: NEGATIVE ng/mL
Dihydrocodeine: NEGATIVE ng/mL
Hydrocodone: NEGATIVE ng/mL
Hydromorphone: NEGATIVE ng/mL
Morphine: NEGATIVE ng/mL
Opiate Confirmation: POSITIVE

## 2020-07-17 NOTE — Progress Notes (Unsigned)
Patient: Lucas [name]  DOB: Nov 17, 1971 MRN: [id number] PCP: System, Pcp Not In  Referring Provider: ***  No chief complaint on file.    Patient Active Problem List   Diagnosis Date Noted  . Abnormal liver CT   . Cirrhosis ([place])   . Ascites   . Scrotal swelling   . Atypical chest pain   . Infection   . Shoulder pain   . Central line infection 06/14/2020  . DM (diabetes mellitus), type 2 with renal complications ([place]) 12/22/3233  . 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 Bacteremia due to Enterococcus 06/10/2020  . Anemia associated with stage 5 chronic renal failure ([place])   . ESRD on hemodialysis ([place])   . Hyperglycemia due to diabetes mellitus ([place]) 06/09/2020     Subjective:  Lucas [name] is a 49 y.o. male here for follow up after recent hospitalization for enterococcal   Seen in ER recently on 8/2 - 8/3 for increased shortness of breath -->    ROS  Past Medical History:  Diagnosis Date  . Atrial fibrillation ([place])   . Diabetes mellitus without complication ([place])   . Renal disorder    end stage    Outpatient Medications Prior to Visit  Medication Sig Dispense Refill  . amiodarone (PACERONE) 200 MG tablet Take 200 mg by mouth daily.    Marland Kitchen amLODipine (NORVASC) 10 MG tablet Take 10 mg by mouth daily.    . ARIPiprazole (ABILIFY) 5 MG tablet Take 5 mg by mouth daily.    Marland Kitchen atorvastatin (LIPITOR) 40 MG tablet Take 1 tablet (40 mg total) by mouth daily. 30 tablet 0  . bumetanide (BUMEX) 2 MG tablet Take 2 mg by mouth See admin instructions. Taking 2 mg twice daily except on HD days, Tues, Thurs, and Saturday not taking.    . carvedilol (COREG) 12.5 MG tablet Take 1 tablet (12.5 mg total) by mouth 2 (two) times daily with a meal. 30 tablet 0  . DAPTOmycin 1,000 mg in sodium chloride 0.9 % 100 mL Inject 1,000 mg into the vein every other day.    Marland Kitchen ELIQUIS 5 MG TABS tablet Take 5 mg by mouth 2 (two) times daily.    Marland Kitchen gabapentin (NEURONTIN) 100 MG capsule Take 1  capsule (100 mg total) by mouth at bedtime. 20 capsule 0  . HUMALOG 100 UNIT/ML injection Inject 2-12 Units into the skin 3 (three) times daily before meals. Per sliding scale:  BG 180-200= 2 units, 201-250= 5 units, 251-300= 7 units, 301-350= 10 units, 351-400 = 12 units, 400 Call MD    . hydrALAZINE (APRESOLINE) 100 MG tablet Take 100 mg by mouth 3 (three) times daily.    . insulin glargine (LANTUS) 100 UNIT/ML injection Inject 0.25 mLs (25 Units total) into the skin at bedtime. 10 mL 1  . Insulin Pen Needle 29G X 12.7MM MISC 30 Containers by Does not apply route daily. 30 each 1  . pantoprazole (PROTONIX) 40 MG tablet Take 40 mg by mouth daily.    . polyethylene glycol (MIRALAX / GLYCOLAX) 17 g packet Take 17 g by mouth daily. 14 each 0  . QUEtiapine (SEROQUEL) 200 MG tablet Take 200 mg by mouth at bedtime.    . sevelamer carbonate (RENVELA) 800 MG tablet Take 1,600 mg by mouth 3 (three) times daily.    . tamsulosin (FLOMAX) 0.4 MG CAPS capsule Take 0.4 mg by mouth daily.     No facility-administered medications prior to visit.  No Known Allergies  Social History   Tobacco Use  . Smoking status: Never Smoker  . Smokeless tobacco: Never Used  Substance Use Topics  . Alcohol use: Not on file  . Drug use: Not on file    No family history on file.  Objective:  There were no vitals filed for this visit. There is no height or weight on file to calculate BMI.  Physical Exam  Lab Results: Lab Results  Component Value Date   WBC 8.2 07/10/2020   HGB 8.1 (L) 07/10/2020   HCT 25.8 (L) 07/10/2020   MCV 93.5 07/10/2020   PLT 312 07/10/2020    Lab Results  Component Value Date   CREATININE 9.35 (H) 07/10/2020   BUN 80 (H) 07/10/2020   NA 134 (L) 07/10/2020   K 4.7 07/10/2020   CL 96 (L) 07/10/2020   CO2 22 07/10/2020    Lab Results  Component Value Date   ALT 8 06/28/2020   AST 22 06/28/2020   GGT 425 (H) 06/29/2020   ALKPHOS 308 (H) 06/28/2020   BILITOT 0.5  06/28/2020     Assessment & Plan:   Problem List Items Addressed This Visit    None      *** will return to clinic in *** {weeks/months} for follow up  Lucas Madeira, MSN, NP-C [facility] for [place] Pager: [phone] Office: [phone]  07/17/20  10:24 PM

## 2020-07-18 ENCOUNTER — Telehealth: Payer: Medicaid Other | Admitting: Infectious Diseases

## 2020-07-18 ENCOUNTER — Other Ambulatory Visit: Payer: Self-pay

## 2021-04-24 IMAGING — CT CT PELVIS W/ CM
2 of 5 series · 14 of 46 positions shown, 16 images · IV contrast (APPLIED)
Comparison: CT dated April 26, 2020

CLINICAL DATA: Pain and swelling of the scrotum.

EXAM:
CT PELVIS WITH CONTRAST
TECHNIQUE: Multidetector CT imaging of the pelvis was performed using the
standard protocol following the bolus administration of intravenous
contrast.
CONTRAST:  100mL OMNIPAQUE IOHEXOL 300 MG/ML  SOLN

[Series 6: soft tissue · axial · 0.79mm/px · z∈[+745,+1158]mm · 11 of 313 slices shown, 13 images]
[im 19/313  soft-tissue]
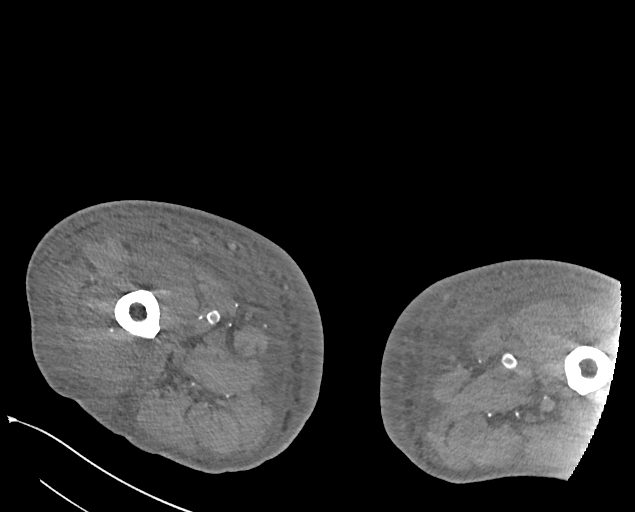
[im 19/313  bone]
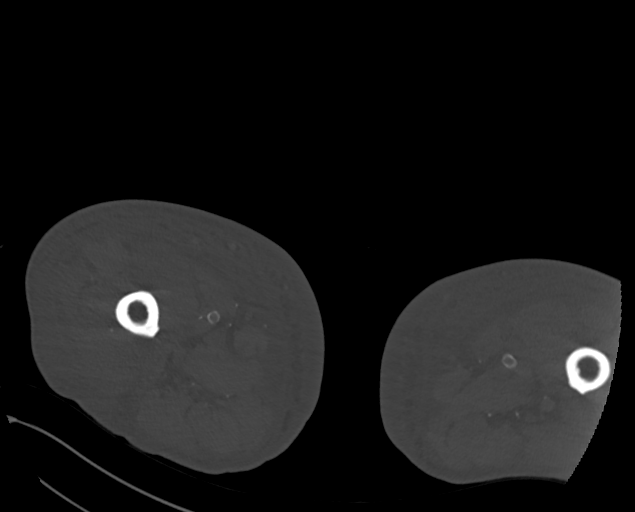
[im 56/313  soft-tissue]
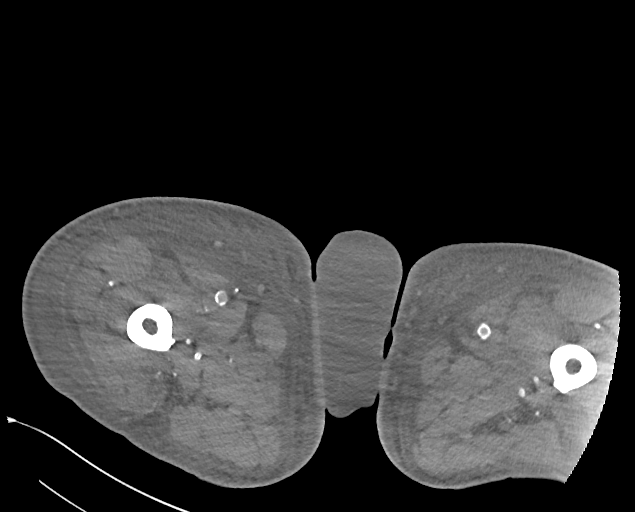
[im 74/313  soft-tissue]
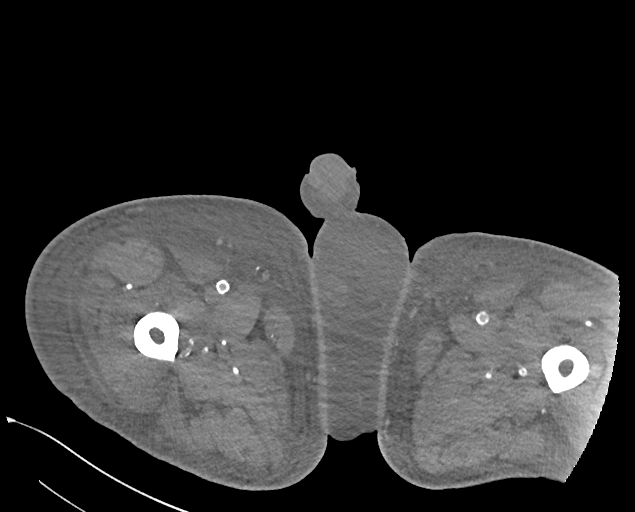
[im 111/313  soft-tissue]
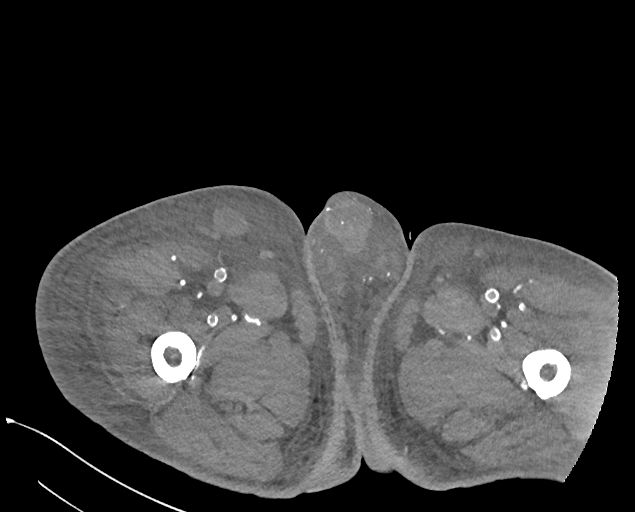
[im 129/313  soft-tissue]
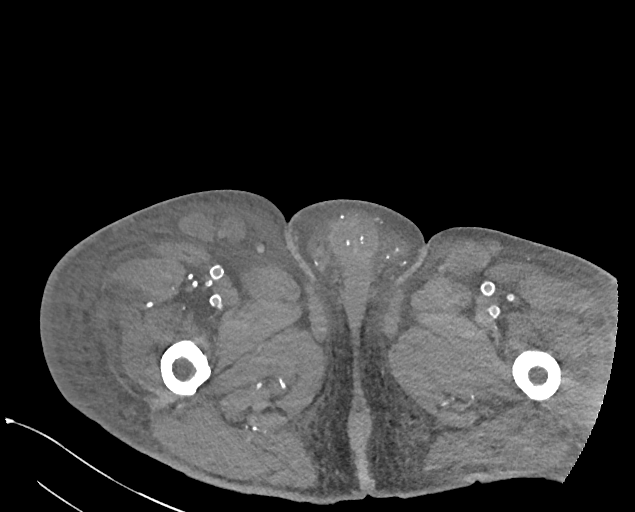
[im 166/313  soft-tissue]
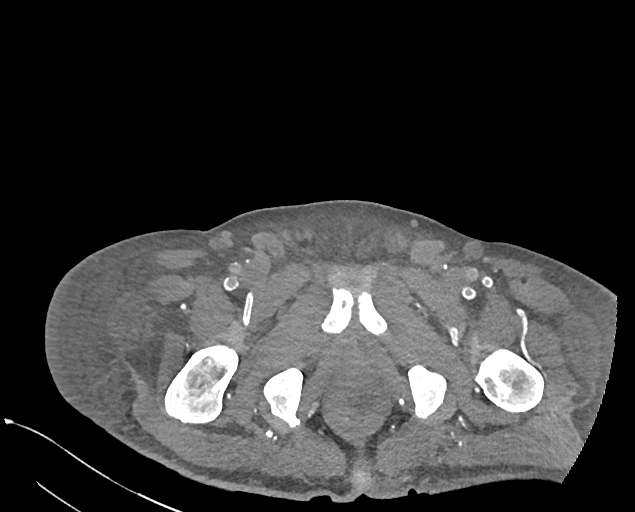
[im 184/313  soft-tissue]
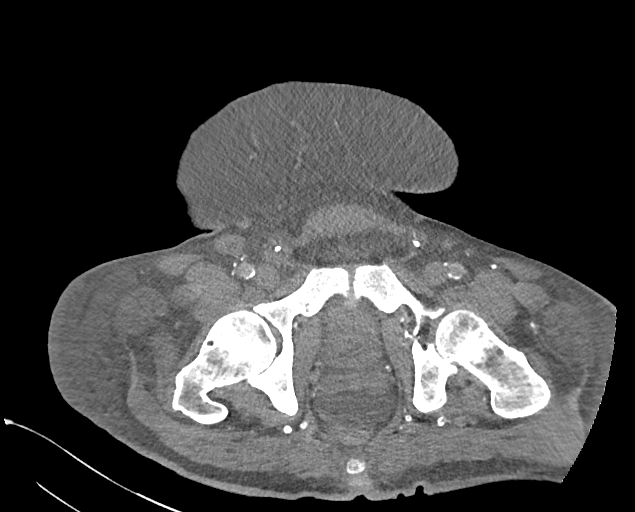
[im 202/313  soft-tissue]
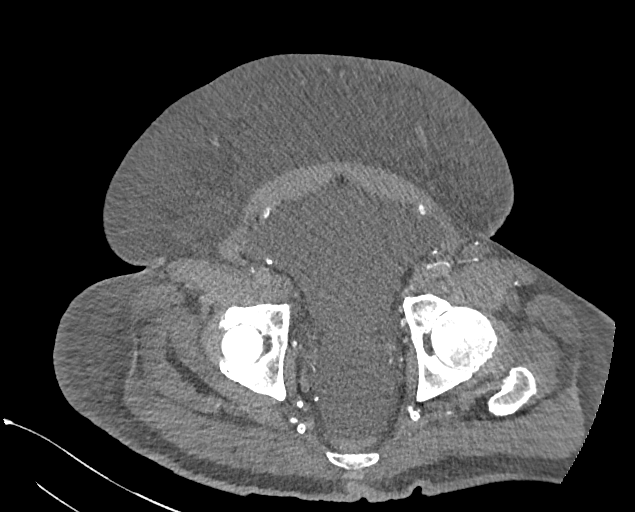
[im 239/313  soft-tissue]
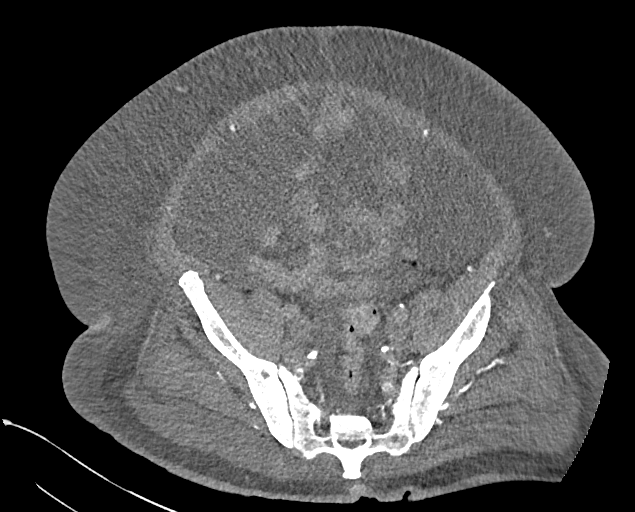
[im 239/313  bone]
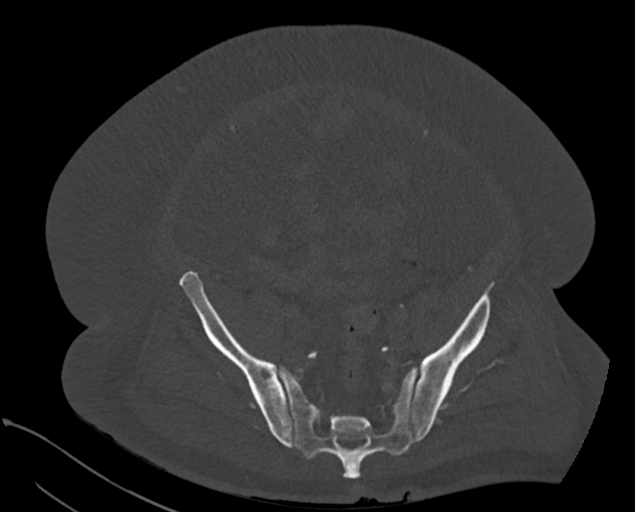
[im 257/313  soft-tissue]
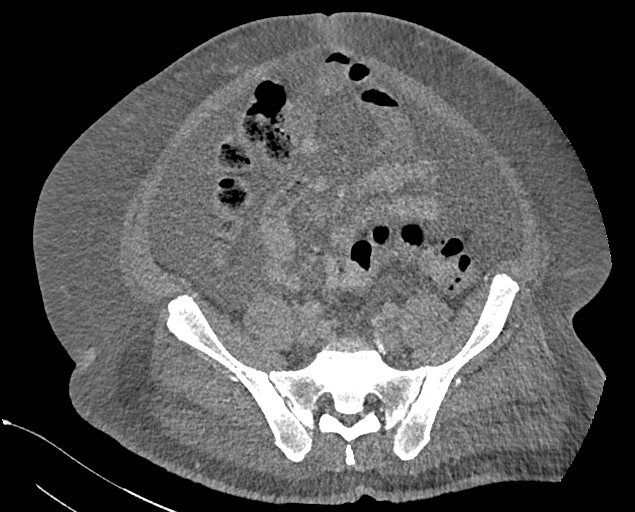
[im 294/313  soft-tissue]
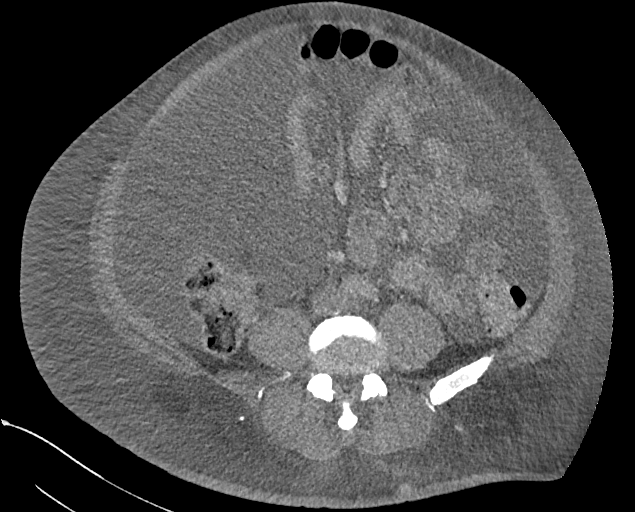

[Series 7: cor soft · coronal · 0.93mm/px · 3 of 269 slices shown]
[im 90/269  soft-tissue]
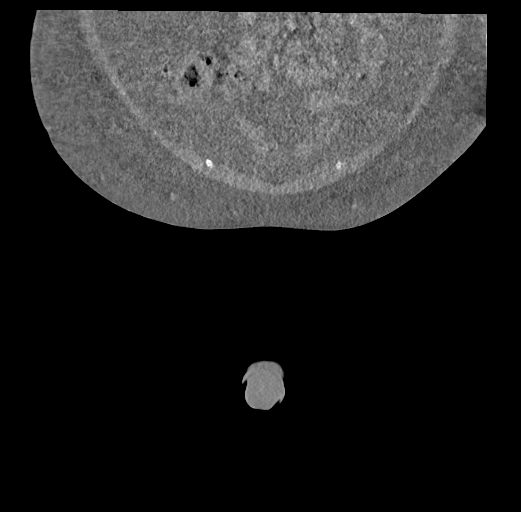
[im 120/269  soft-tissue]
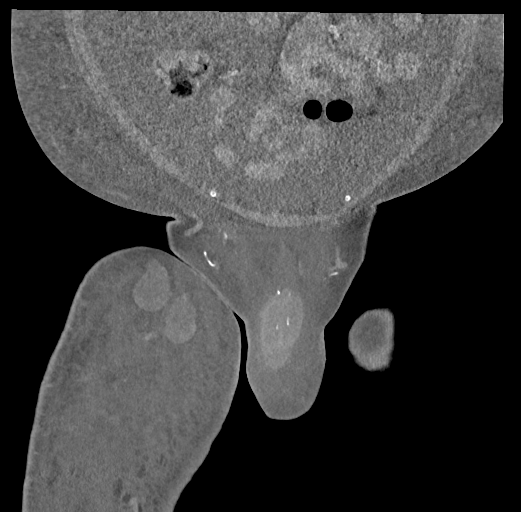
[im 149/269  soft-tissue]
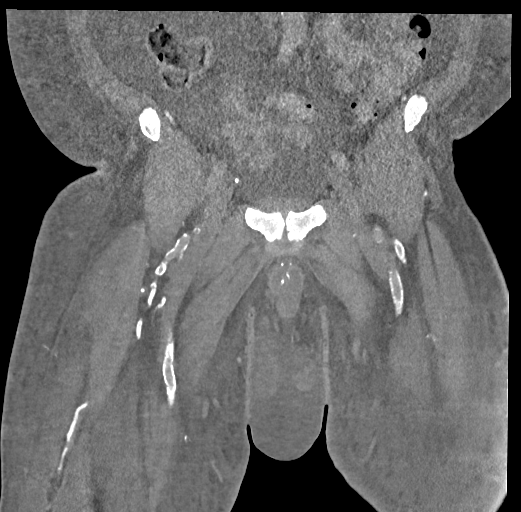

[14 of 46 positions shown; findings below may reference images not displayed]

FINDINGS: Urinary Tract: The bladder is decompressed which limits evaluation.
There appears to be some mild bladder wall thickening.

Bowel: There is scattered sigmoid diverticula without CT evidence
for diverticulitis. There is a large amount of stool involving the
visualized portions of the colon. The visualized small bowel is
unremarkable.

Vascular/Lymphatic: Advanced vascular calcifications are noted.
There are enlarged inguinal and pelvic lymph nodes. Many of these
were visualized on the patient's CT from April 2020 an appear
relatively similar in size.

Reproductive: There is diffuse nonspecific scrotal wall edema. The
visualized testicles are unremarkable by CT standards. There is no
gas. No abscess.

Other: There is a large volume of ascites in the partially
visualized abdomen. There is diffuse anasarca.

Musculoskeletal: There is no acute displaced fracture. There is
early avascular necrosis of the left femoral head.
IMPRESSION: 1. Diffuse nonspecific scrotal wall edema. No gas. No abscess.
2. Large volume of ascites in the partially visualized abdomen.
3. Diffuse anasarca.
4. Enlarged inguinal and pelvic lymph nodes. Many of these were
visualized on the patient's CT from April 2020 an appear relatively
similar in size. In the absence of any known malignancy, these are
favored to be reactive in etiology.
5. Early avascular necrosis of the left femoral head.

Aortic Atherosclerosis ([id number]).

## 2023-08-15 DEATH — deceased
